# Patient Record
Sex: Female | Born: 1940 | Race: White | Hispanic: No | Marital: Married | Smoking: Never smoker
Health system: Southern US, Community
[De-identification: ages and names within clinical notes are randomized; demographics above are authoritative.]

## PROBLEM LIST (undated history)

## (undated) DIAGNOSIS — N184 Chronic kidney disease, stage 4 (severe): Secondary | ICD-10-CM

## (undated) DIAGNOSIS — D631 Anemia in chronic kidney disease: Secondary | ICD-10-CM

## (undated) DIAGNOSIS — E119 Type 2 diabetes mellitus without complications: Secondary | ICD-10-CM

## (undated) DIAGNOSIS — D4622 Refractory anemia with excess of blasts 2: Secondary | ICD-10-CM

## (undated) DIAGNOSIS — I10 Essential (primary) hypertension: Secondary | ICD-10-CM

## (undated) DIAGNOSIS — E785 Hyperlipidemia, unspecified: Secondary | ICD-10-CM

## (undated) DIAGNOSIS — Z95828 Presence of other vascular implants and grafts: Secondary | ICD-10-CM

## (undated) DIAGNOSIS — D509 Iron deficiency anemia, unspecified: Secondary | ICD-10-CM

## (undated) DIAGNOSIS — C8290 Follicular lymphoma, unspecified, unspecified site: Secondary | ICD-10-CM

## (undated) HISTORY — DX: Anemia in chronic kidney disease: D63.1

## (undated) HISTORY — DX: Type 2 diabetes mellitus without complications: E11.9

## (undated) HISTORY — DX: Presence of other vascular implants and grafts: Z95.828

## (undated) HISTORY — DX: Iron deficiency anemia, unspecified: D50.9

## (undated) HISTORY — PX: APPENDECTOMY: SHX54

## (undated) HISTORY — DX: Follicular lymphoma, unspecified, unspecified site: C82.90

## (undated) HISTORY — PX: CATARACT EXTRACTION: SUR2

## (undated) HISTORY — DX: Refractory anemia with excess of blasts 2: D46.22

## (undated) HISTORY — DX: Chronic kidney disease, stage 4 (severe): N18.4

---

## 1999-07-09 ENCOUNTER — Other Ambulatory Visit: Admission: RE | Admit: 1999-07-09 | Discharge: 1999-07-09 | Payer: Self-pay | Admitting: *Deleted

## 2004-04-23 ENCOUNTER — Emergency Department (HOSPITAL_COMMUNITY): Admission: EM | Admit: 2004-04-23 | Discharge: 2004-04-23 | Payer: Self-pay | Admitting: Emergency Medicine

## 2005-11-04 ENCOUNTER — Encounter: Admission: RE | Admit: 2005-11-04 | Discharge: 2005-11-04 | Payer: Self-pay | Admitting: Family Medicine

## 2005-11-11 ENCOUNTER — Encounter: Admission: RE | Admit: 2005-11-11 | Discharge: 2005-11-11 | Payer: Self-pay | Admitting: Family Medicine

## 2006-04-19 ENCOUNTER — Ambulatory Visit: Payer: Self-pay | Admitting: Gastroenterology

## 2006-05-02 ENCOUNTER — Other Ambulatory Visit: Admission: RE | Admit: 2006-05-02 | Discharge: 2006-05-02 | Payer: Self-pay | Admitting: Family Medicine

## 2006-05-18 ENCOUNTER — Ambulatory Visit: Payer: Self-pay | Admitting: Gastroenterology

## 2008-01-25 ENCOUNTER — Ambulatory Visit (HOSPITAL_COMMUNITY): Payer: Self-pay | Admitting: Oncology

## 2008-01-25 ENCOUNTER — Encounter (HOSPITAL_COMMUNITY): Admission: RE | Admit: 2008-01-25 | Discharge: 2008-02-24 | Payer: Self-pay | Admitting: Oncology

## 2008-04-26 ENCOUNTER — Encounter (HOSPITAL_COMMUNITY): Admission: RE | Admit: 2008-04-26 | Discharge: 2008-05-26 | Payer: Self-pay | Admitting: Oncology

## 2008-04-26 ENCOUNTER — Ambulatory Visit (HOSPITAL_COMMUNITY): Payer: Self-pay | Admitting: Oncology

## 2008-08-01 ENCOUNTER — Encounter (HOSPITAL_COMMUNITY): Admission: RE | Admit: 2008-08-01 | Discharge: 2008-08-31 | Payer: Self-pay | Admitting: Oncology

## 2008-08-01 ENCOUNTER — Ambulatory Visit (HOSPITAL_COMMUNITY): Payer: Self-pay | Admitting: Oncology

## 2009-11-27 ENCOUNTER — Ambulatory Visit (HOSPITAL_COMMUNITY): Admission: RE | Admit: 2009-11-27 | Discharge: 2009-11-27 | Payer: Self-pay | Admitting: Ophthalmology

## 2010-01-15 ENCOUNTER — Ambulatory Visit (HOSPITAL_COMMUNITY): Admission: RE | Admit: 2010-01-15 | Discharge: 2010-01-15 | Payer: Self-pay | Admitting: Ophthalmology

## 2010-09-27 LAB — GLUCOSE, CAPILLARY
Glucose-Capillary: 29 mg/dL — CL (ref 70–99)
Glucose-Capillary: 58 mg/dL — ABNORMAL LOW (ref 70–99)
Glucose-Capillary: 98 mg/dL (ref 70–99)

## 2010-09-28 LAB — GLUCOSE, CAPILLARY: Glucose-Capillary: 91 mg/dL (ref 70–99)

## 2010-09-29 LAB — BASIC METABOLIC PANEL WITH GFR
BUN: 21 mg/dL (ref 6–23)
CO2: 29 meq/L (ref 19–32)
Calcium: 9.7 mg/dL (ref 8.4–10.5)
Chloride: 104 meq/L (ref 96–112)
Creatinine, Ser: 1.15 mg/dL (ref 0.4–1.2)
GFR calc non Af Amer: 47 mL/min — ABNORMAL LOW
Glucose, Bld: 146 mg/dL — ABNORMAL HIGH (ref 70–99)
Potassium: 4.5 meq/L (ref 3.5–5.1)
Sodium: 139 meq/L (ref 135–145)

## 2010-09-29 LAB — CBC
HCT: 32.8 % — ABNORMAL LOW (ref 36.0–46.0)
Hemoglobin: 11.1 g/dL — ABNORMAL LOW (ref 12.0–15.0)
MCHC: 33.9 g/dL (ref 30.0–36.0)
MCV: 77.9 fL — ABNORMAL LOW (ref 78.0–100.0)
Platelets: 309 K/uL (ref 150–400)
RBC: 4.21 MIL/uL (ref 3.87–5.11)
RDW: 18.5 % — ABNORMAL HIGH (ref 11.5–15.5)
WBC: 9.3 K/uL (ref 4.0–10.5)

## 2010-10-26 LAB — CBC
HCT: 36.5 % (ref 36.0–46.0)
Hemoglobin: 11.5 g/dL — ABNORMAL LOW (ref 12.0–15.0)
MCHC: 31.6 g/dL (ref 30.0–36.0)
MCV: 80.7 fL (ref 78.0–100.0)
Platelets: 337 10*3/uL (ref 150–400)
RBC: 4.53 MIL/uL (ref 3.87–5.11)
RDW: 17.7 % — ABNORMAL HIGH (ref 11.5–15.5)
WBC: 9.5 10*3/uL (ref 4.0–10.5)

## 2010-10-26 LAB — DIFFERENTIAL
Basophils Absolute: 0.1 10*3/uL (ref 0.0–0.1)
Basophils Relative: 1 % (ref 0–1)
Eosinophils Absolute: 0.2 10*3/uL (ref 0.0–0.7)
Eosinophils Relative: 2 % (ref 0–5)
Lymphocytes Relative: 17 % (ref 12–46)
Lymphs Abs: 1.6 10*3/uL (ref 0.7–4.0)
Monocytes Absolute: 0.5 10*3/uL (ref 0.1–1.0)
Monocytes Relative: 5 % (ref 3–12)
Neutro Abs: 7.1 10*3/uL (ref 1.7–7.7)
Neutrophils Relative %: 75 % (ref 43–77)

## 2010-10-26 LAB — COMPREHENSIVE METABOLIC PANEL
ALT: 19 U/L (ref 0–35)
AST: 21 U/L (ref 0–37)
Albumin: 3.5 g/dL (ref 3.5–5.2)
Alkaline Phosphatase: 75 U/L (ref 39–117)
BUN: 20 mg/dL (ref 6–23)
CO2: 26 mEq/L (ref 19–32)
Calcium: 9.2 mg/dL (ref 8.4–10.5)
Chloride: 101 mEq/L (ref 96–112)
Creatinine, Ser: 1.28 mg/dL — ABNORMAL HIGH (ref 0.4–1.2)
GFR calc Af Amer: 50 mL/min — ABNORMAL LOW (ref >60)
GFR calc non Af Amer: 42 mL/min — ABNORMAL LOW (ref >60)
Glucose, Bld: 223 mg/dL — ABNORMAL HIGH (ref 70–99)
Potassium: 4.2 mEq/L (ref 3.5–5.1)
Sodium: 137 mEq/L (ref 135–145)
Total Bilirubin: 0.4 mg/dL (ref 0.3–1.2)
Total Protein: 6.6 g/dL (ref 6.0–8.3)

## 2010-10-26 LAB — PROTEIN ELECTROPH W RFLX QUANT IMMUNOGLOBULINS
Albumin ELP: 53.9 % — ABNORMAL LOW (ref 55.8–66.1)
Alpha-1-Globulin: 5.6 % — ABNORMAL HIGH (ref 2.9–4.9)
Alpha-2-Globulin: 16 % — ABNORMAL HIGH (ref 7.1–11.8)
Beta 2: 5 % (ref 3.2–6.5)
Beta Globulin: 7.2 % (ref 4.7–7.2)
Gamma Globulin: 12.3 % (ref 11.1–18.8)
M-Spike, %: NOT DETECTED g/dL
Total Protein ELP: 7 g/dL (ref 6.0–8.3)

## 2010-11-27 NOTE — Assessment & Plan Note (Signed)
[facility]                           GASTROENTEROLOGY OFFICE NOTE   NAME:[name] [name], [name] [name]                       MRN:          [id number]  DATE:04/19/2006                            DOB:          1940/12/02    REFERRING PHYSICIAN:  MARY [name]   REASON FOR REFERRAL:  Dr. Daphine Deutscher asked me to evaluate [name] [name] in  consultation regarding colorectal cancer screening and solid food dysphagia.   HISTORY OF PRESENT ILLNESS:  [name] [name] is a pleasant 70 year old woman who  has not had colorectal cancer screening.  She has no constipation or  diarrhea that lasts for more than a very brief episode.  She has had no  bleeding.  Colon cancer does not run in her family.  She did have some type  of an emergency surgery that she believes was done on her colon.  This was  25 years ago, and she is very vague on the details.  She believes that her  appendix was removed at the same time, and she thinks a polyp or some of her  intestine was removed.   She also complains of intermittent, pill-induced dysphagia that has been  bothering her for the past 2 to 3 months.  She is pretty clear that food  does not cause her any troubles, but certain pills seem to feel like they  get lodged in her mid esophagus.  She generally drinks these down with extra  water, and the feeling resolves.  She has no pyrosis.  No acid  regurgitation.   REVIEW OF SYSTEMS:  Notable for stable weight, and is otherwise essentially  normal and is available on her nursing intake sheet.   PAST MEDICAL HISTORY:  1. Obesity.  2. Diabetes.  3. Allergy.  4. Status post appendectomy.  5. Status post partial thyroidectomy.  6. Status post emergency laparotomy with an appendectomy.   CURRENT MEDICATIONS:  Diovan, hydrochlorothiazide, glimepiride, Actos,  Lipitor, metformin.   ALLERGIES:  No known drug allergies.   SOCIAL HISTORY:  Married with 3 children, nonsmoker, nondrinker.   FAMILY HISTORY:   Mother with diabetes, aunt with breast cancer, mother with  heart disease.  No colon cancer or colon polyps in the family.   PHYSICAL EXAMINATION:  Height 5 feet 5 inches, 248 pounds, blood pressure  132/78, pulse 80.  CONSTITUTIONAL:  Obese, otherwise well-appearing.  NEUROLOGIC:  Alert and oriented x3.  EYES:  Extraocular movements intact.  MOUTH:  Oropharynx moist, no lesions.  NECK:  Supple, no lymphadenopathy.  CARDIOVASCULAR:  Heart regular rate and rhythm.  LUNGS:  Clear to auscultation bilaterally.  ABDOMEN:  Soft, nontender, nondistended, normal bowel sounds.  EXTREMITIES:  No lower extremity edema.  SKIN:  No rashes or lesions on visible extremities.   ASSESSMENT AND PLAN:  [age]-year-old woman at routine risk for  colorectal cancer, history of pill-induced dysphagia.   First, she is due for colorectal cancer screening, and I will arrange for  that to be done with full screening colonoscopy at her soonest convenience.  She does also have a dysphagia to certain pills, but it does  not appear that  she has any food or liquid dysphagia.  We will arrange for her to have  esophagogastroduodenoscopy at the same time as her colonoscopy to further  evaluate, perhaps she has a Schatzki's ring or a peptic stricture.  I see no  reason for any further blood tests or imaging studies prior to then.            ______________________________  Rachael Fee, MD      DPJ/MedQ  DD:  04/19/2006  DT:  04/21/2006  Job #:  [id number]   cc:   Paulene Floor, [place], [facility]

## 2011-04-09 LAB — CBC
HCT: 33.1 — ABNORMAL LOW
Hemoglobin: 10.7 — ABNORMAL LOW
MCHC: 32.4
MCV: 81.8
Platelets: 305
RBC: 4.05
RDW: 17.2 — ABNORMAL HIGH
WBC: 11.8 — ABNORMAL HIGH

## 2011-04-09 LAB — PROTEIN ELECTROPH W RFLX QUANT IMMUNOGLOBULINS
Albumin ELP: 51.9 — ABNORMAL LOW
Alpha-1-Globulin: 6.2 — ABNORMAL HIGH
Alpha-2-Globulin: 17.3 — ABNORMAL HIGH
Beta 2: 5.1
Beta Globulin: 8 — ABNORMAL HIGH
Gamma Globulin: 11.5
M-Spike, %: NOT DETECTED
Total Protein ELP: 7.1

## 2011-04-09 LAB — COMPREHENSIVE METABOLIC PANEL
ALT: 16
AST: 21
Albumin: 3.6
Alkaline Phosphatase: 80
BUN: 15
CO2: 28
Calcium: 9.6
Chloride: 104
Creatinine, Ser: 1.14
GFR calc Af Amer: 58 — ABNORMAL LOW
GFR calc non Af Amer: 48 — ABNORMAL LOW
Glucose, Bld: 70
Potassium: 4.1
Sodium: 140
Total Bilirubin: 0.4
Total Protein: 6.3

## 2011-04-09 LAB — IMMUNOFIXATION ELECTROPHORESIS
IgA: 108
IgG (Immunoglobin G), Serum: 737
IgM, Serum: 253
Total Protein ELP: 7.1

## 2011-04-09 LAB — DIFFERENTIAL
Basophils Absolute: 0.1
Basophils Relative: 1
Eosinophils Absolute: 0.2
Eosinophils Relative: 2
Lymphocytes Relative: 21
Lymphs Abs: 2.5
Monocytes Absolute: 0.8
Monocytes Relative: 7
Neutro Abs: 8.3 — ABNORMAL HIGH
Neutrophils Relative %: 70

## 2011-04-09 LAB — IMMUNOFIXATION ADD-ON

## 2011-04-09 LAB — IGG, IGA, IGM
IgA: 106
IgG (Immunoglobin G), Serum: 725
IgM, Serum: 255

## 2011-04-13 LAB — COMPREHENSIVE METABOLIC PANEL
ALT: 17
AST: 21
Albumin: 3.6
Alkaline Phosphatase: 82
BUN: 22
CO2: 26
Calcium: 9.4
Chloride: 103
Creatinine, Ser: 1.12
GFR calc Af Amer: 59 — ABNORMAL LOW
GFR calc non Af Amer: 49 — ABNORMAL LOW
Glucose, Bld: 179 — ABNORMAL HIGH
Potassium: 4.3
Sodium: 139
Total Bilirubin: 0.4
Total Protein: 6.4

## 2011-04-13 LAB — PROTEIN ELECTROPH W RFLX QUANT IMMUNOGLOBULINS
Albumin ELP: 52.6 — ABNORMAL LOW
Alpha-1-Globulin: 6 — ABNORMAL HIGH
Alpha-2-Globulin: 17.9 — ABNORMAL HIGH
Beta 2: 4.4
Beta Globulin: 7.9 — ABNORMAL HIGH

## 2011-04-13 LAB — DIFFERENTIAL
Basophils Absolute: 0.1
Basophils Relative: 1
Eosinophils Absolute: 0.2
Eosinophils Relative: 3
Lymphocytes Relative: 25
Lymphs Abs: 2.1
Monocytes Absolute: 0.5
Monocytes Relative: 6
Neutro Abs: 5.6
Neutrophils Relative %: 66

## 2011-04-13 LAB — CBC
HCT: 34.1 — ABNORMAL LOW
Hemoglobin: 11 — ABNORMAL LOW
MCHC: 32.3
MCV: 82.1
Platelets: 304
RBC: 4.15
RDW: 17.9 — ABNORMAL HIGH
WBC: 8.6

## 2011-04-13 LAB — IMMUNOFIXATION ELECTROPHORESIS
IgM, Serum: 187
Total Protein ELP: 6.7

## 2011-04-13 LAB — IMMUNOFIXATION ADD-ON

## 2012-09-19 ENCOUNTER — Other Ambulatory Visit: Payer: Self-pay | Admitting: Urology

## 2012-09-19 ENCOUNTER — Ambulatory Visit (INDEPENDENT_AMBULATORY_CARE_PROVIDER_SITE_OTHER): Payer: Medicare Other | Admitting: Urology

## 2012-09-19 DIAGNOSIS — N1330 Unspecified hydronephrosis: Secondary | ICD-10-CM

## 2012-09-19 DIAGNOSIS — N8110 Cystocele, unspecified: Secondary | ICD-10-CM

## 2012-09-19 DIAGNOSIS — N814 Uterovaginal prolapse, unspecified: Secondary | ICD-10-CM

## 2012-09-19 DIAGNOSIS — N189 Chronic kidney disease, unspecified: Secondary | ICD-10-CM

## 2012-09-22 ENCOUNTER — Ambulatory Visit (HOSPITAL_COMMUNITY)
Admission: RE | Admit: 2012-09-22 | Discharge: 2012-09-22 | Disposition: A | Discharge status: Home / Self Care | Payer: Medicare Other | Source: Ambulatory Visit | Attending: Urology | Admitting: Urology

## 2012-09-22 DIAGNOSIS — R935 Abnormal findings on diagnostic imaging of other abdominal regions, including retroperitoneum: Secondary | ICD-10-CM | POA: Insufficient documentation

## 2012-09-22 DIAGNOSIS — K8020 Calculus of gallbladder without cholecystitis without obstruction: Secondary | ICD-10-CM | POA: Insufficient documentation

## 2012-09-22 DIAGNOSIS — N1330 Unspecified hydronephrosis: Secondary | ICD-10-CM | POA: Insufficient documentation

## 2012-09-22 DIAGNOSIS — R1031 Right lower quadrant pain: Secondary | ICD-10-CM | POA: Insufficient documentation

## 2012-09-26 ENCOUNTER — Other Ambulatory Visit: Payer: Self-pay | Admitting: Urology

## 2012-09-26 DIAGNOSIS — N1330 Unspecified hydronephrosis: Secondary | ICD-10-CM

## 2012-09-29 ENCOUNTER — Ambulatory Visit (HOSPITAL_COMMUNITY)
Admission: RE | Admit: 2012-09-29 | Discharge: 2012-09-29 | Disposition: A | Discharge status: Home / Self Care | Payer: Medicare Other | Source: Ambulatory Visit | Attending: Urology | Admitting: Urology

## 2012-09-29 ENCOUNTER — Encounter (HOSPITAL_COMMUNITY): Payer: Self-pay

## 2012-09-29 DIAGNOSIS — R935 Abnormal findings on diagnostic imaging of other abdominal regions, including retroperitoneum: Secondary | ICD-10-CM | POA: Insufficient documentation

## 2012-09-29 DIAGNOSIS — N1330 Unspecified hydronephrosis: Secondary | ICD-10-CM | POA: Insufficient documentation

## 2012-09-29 DIAGNOSIS — K5730 Diverticulosis of large intestine without perforation or abscess without bleeding: Secondary | ICD-10-CM | POA: Insufficient documentation

## 2012-10-04 ENCOUNTER — Other Ambulatory Visit: Payer: Self-pay | Admitting: Urology

## 2012-10-04 DIAGNOSIS — R1900 Intra-abdominal and pelvic swelling, mass and lump, unspecified site: Secondary | ICD-10-CM

## 2012-10-05 ENCOUNTER — Other Ambulatory Visit: Payer: Self-pay | Admitting: Radiology

## 2012-10-06 ENCOUNTER — Encounter (HOSPITAL_COMMUNITY): Payer: Self-pay | Admitting: Pharmacy Technician

## 2012-10-06 ENCOUNTER — Other Ambulatory Visit: Payer: Self-pay | Admitting: Radiology

## 2012-10-10 ENCOUNTER — Other Ambulatory Visit: Payer: Self-pay | Admitting: Radiology

## 2012-10-11 ENCOUNTER — Ambulatory Visit (HOSPITAL_COMMUNITY)
Admission: RE | Admit: 2012-10-11 | Discharge: 2012-10-11 | Disposition: A | Discharge status: Home / Self Care | Payer: Medicare Other | Source: Ambulatory Visit | Attending: Urology | Admitting: Urology

## 2012-10-11 ENCOUNTER — Encounter (HOSPITAL_COMMUNITY): Payer: Self-pay

## 2012-10-11 ENCOUNTER — Other Ambulatory Visit (HOSPITAL_COMMUNITY): Payer: Medicare Other

## 2012-10-11 VITALS — BP 152/75 | HR 71 | Temp 98.0°F | Resp 20

## 2012-10-11 DIAGNOSIS — E785 Hyperlipidemia, unspecified: Secondary | ICD-10-CM | POA: Insufficient documentation

## 2012-10-11 DIAGNOSIS — E119 Type 2 diabetes mellitus without complications: Secondary | ICD-10-CM | POA: Insufficient documentation

## 2012-10-11 DIAGNOSIS — N135 Crossing vessel and stricture of ureter without hydronephrosis: Secondary | ICD-10-CM | POA: Insufficient documentation

## 2012-10-11 DIAGNOSIS — R1900 Intra-abdominal and pelvic swelling, mass and lump, unspecified site: Secondary | ICD-10-CM

## 2012-10-11 DIAGNOSIS — C8589 Other specified types of non-Hodgkin lymphoma, extranodal and solid organ sites: Secondary | ICD-10-CM | POA: Insufficient documentation

## 2012-10-11 DIAGNOSIS — R1909 Other intra-abdominal and pelvic swelling, mass and lump: Secondary | ICD-10-CM | POA: Insufficient documentation

## 2012-10-11 DIAGNOSIS — I10 Essential (primary) hypertension: Secondary | ICD-10-CM | POA: Insufficient documentation

## 2012-10-11 DIAGNOSIS — Z79899 Other long term (current) drug therapy: Secondary | ICD-10-CM | POA: Insufficient documentation

## 2012-10-11 HISTORY — DX: Hyperlipidemia, unspecified: E78.5

## 2012-10-11 HISTORY — DX: Essential (primary) hypertension: I10

## 2012-10-11 LAB — CBC
HCT: 31.8 % — ABNORMAL LOW (ref 36.0–46.0)
Hemoglobin: 9.7 g/dL — ABNORMAL LOW (ref 12.0–15.0)
MCHC: 30.5 g/dL (ref 30.0–36.0)
MCV: 78.7 fL (ref 78.0–100.0)
RDW: 16.7 % — ABNORMAL HIGH (ref 11.5–15.5)

## 2012-10-11 LAB — APTT: aPTT: 30 seconds (ref 24–37)

## 2012-10-11 LAB — PROTIME-INR: INR: 1.03 (ref 0.00–1.49)

## 2012-10-11 MED ORDER — MIDAZOLAM HCL 2 MG/2ML IJ SOLN
INTRAMUSCULAR | Status: AC | PRN
  Administered 2012-10-11: 2 mg via INTRAVENOUS

## 2012-10-11 MED ORDER — FENTANYL CITRATE 0.05 MG/ML IJ SOLN
INTRAMUSCULAR | Status: AC
  Filled 2012-10-11: qty 4

## 2012-10-11 MED ORDER — MIDAZOLAM HCL 2 MG/2ML IJ SOLN
INTRAMUSCULAR | Status: AC
  Filled 2012-10-11: qty 4

## 2012-10-11 MED ORDER — HYDROCODONE-ACETAMINOPHEN 5-325 MG PO TABS: 1.0000 | ORAL_TABLET | ORAL | Status: DC | PRN

## 2012-10-11 MED ORDER — SODIUM CHLORIDE 0.9 % IV SOLN
INTRAVENOUS | Status: DC
  Administered 2012-10-11: 11:00:00 via INTRAVENOUS

## 2012-10-11 MED ORDER — FENTANYL CITRATE 0.05 MG/ML IJ SOLN
INTRAMUSCULAR | Status: AC | PRN
  Administered 2012-10-11: 100 ug via INTRAVENOUS

## 2012-10-11 NOTE — H&P (Signed)
Chief Complaint: "I'm here for a biopsy" Referring Physician:Wrenn HPI: [name] [name] is an 72 y.o. female with newly found rght pelvic retroperitoneal mass. She is scheduled for CT guided biopsy. PMHx and meds reviewed.  Past Medical History:  Past Medical History  Diagnosis Date  . Hypertension   . Diabetes   . Hyperlipidemia     Past Surgical History: History reviewed. No pertinent past surgical history.  Family History: History reviewed. No pertinent family history.  Social History:  reports that she has never smoked. She does not have any smokeless tobacco history on file. She reports that she does not drink alcohol or use illicit drugs.  Allergies: No Known Allergies  Medications: acetaminophen (TYLENOL ARTHRITIS PAIN) 650 MG CR tablet (Taking) Sig - Route: Take 650 mg by mouth every 8 (eight) hours as needed for pain. - Oral Class: Historical Med atorvastatin (LIPITOR) 80 MG tablet (Taking) Sig - Route: Take 80 mg by mouth every morning. - Oral Class: Historical Med Number of times this order has been changed since signing: 1 [address] docusate sodium (COLACE) 100 MG capsule (Taking) Sig - Route: Take 100-200 mg by mouth daily. - Oral Class: Historical Med furosemide (LASIX) 20 MG tablet (Taking) Sig - Route: Take 20 mg by mouth daily. - Oral Class: Historical Med Number of times this order has been changed since signing: 1 [address] glipiZIDE (GLUCOTROL) 10 MG tablet (Taking) Sig - Route: Take 10 mg by mouth 2 (two) times daily before a meal. - Oral Class: Historical Med Number of times this order has been changed since signing: 1 [address] insulin detemir (LEVEMIR FLEXPEN) 100 UNIT/ML injection (Taking) Sig - Route: Inject 50-60 Units into the skin every morning. - Subcutaneous Class: Historical Med Number of times this order has been changed since signing: 1 Order [address] losartan (COZAAR) 100 MG tablet (Taking) Sig - Route: Take 100 mg by mouth every  morning. - Oral Class: Historical Med Number of times this order has been changed since signing: 1 Order [address] Multiple Vitamin (MULTIVITAMIN WITH MINERALS) TABS (Taking) Sig - Route: Take 1 tablet by mouth daily.   Please HPI for pertinent positives, otherwise complete 10 system ROS negative.  Physical Exam: Temp: 98, HR: 71, RR: 18, BP 189/75, O2: 99%   General Appearance:  Alert, cooperative, no distress, appears stated age  Head:  Normocephalic, without obvious abnormality, atraumatic  ENT: Unremarkable  Neck: Supple, symmetrical, trachea midline.  Lungs:   Clear to auscultation bilaterally, no w/r/r, respirations unlabored without use of accessory muscles.  Heart:  Regular rate and rhythm, S1, S2 normal, no murmur, rub or gallop.   Neurologic: Normal affect, no gross deficits.   Results for orders placed during the hospital encounter of 10/11/12 (from the past 48 hour(s))  APTT     Status: None   Collection Time    10/11/12 10:45 AM      Result Value Range   aPTT 30  24 - 37 seconds  CBC     Status: Abnormal   Collection Time    10/11/12 10:45 AM      Result Value Range   WBC 8.6  4.0 - 10.5 K/uL   RBC 4.04  3.87 - 5.11 MIL/uL   Hemoglobin 9.7 (*) 12.0 - 15.0 g/dL   HCT 16.1 (*) 09.6 - 04.5 %   MCV 78.7  78.0 - 100.0 fL   MCH 24.0 (*) 26.0 - 34.0 pg   MCHC  30.5  30.0 - 36.0 g/dL   RDW 11.9 (*) 14.7 - 82.9 %   Platelets 317  150 - 400 K/uL  PROTIME-INR     Status: None   Collection Time    10/11/12 10:45 AM      Result Value Range   Prothrombin Time 13.4  11.6 - 15.2 seconds   INR 1.03  0.00 - 1.49   No results found.  Assessment/Plan Retroperitoneal mass For CT guided biopsy today Discussed procedure, risks, complications, use of sedation. Pt ate some oatmeal at 0700. Labs reviewed, ok. Consent signed in chart  Brayton El PA-C 10/11/2012, 11:23 AM

## 2012-10-11 NOTE — Procedures (Signed)
CT core biopsy No complication No blood loss. See complete dictation in [facility].

## 2012-10-31 ENCOUNTER — Encounter (HOSPITAL_COMMUNITY): Payer: Medicare Other | Attending: Oncology | Admitting: Oncology

## 2012-10-31 VITALS — BP 154/56 | HR 69 | Temp 97.8°F | Resp 20 | Wt 235.6 lb

## 2012-10-31 DIAGNOSIS — C8589 Other specified types of non-Hodgkin lymphoma, extranodal and solid organ sites: Secondary | ICD-10-CM

## 2012-10-31 DIAGNOSIS — C8299 Follicular lymphoma, unspecified, extranodal and solid organ sites: Secondary | ICD-10-CM | POA: Insufficient documentation

## 2012-10-31 DIAGNOSIS — E119 Type 2 diabetes mellitus without complications: Secondary | ICD-10-CM | POA: Insufficient documentation

## 2012-10-31 DIAGNOSIS — C8590 Non-Hodgkin lymphoma, unspecified, unspecified site: Secondary | ICD-10-CM

## 2012-10-31 LAB — COMPREHENSIVE METABOLIC PANEL
ALT: 12 U/L (ref 0–35)
AST: 16 U/L (ref 0–37)
Albumin: 3.9 g/dL (ref 3.5–5.2)
Alkaline Phosphatase: 132 U/L — ABNORMAL HIGH (ref 39–117)
BUN: 48 mg/dL — ABNORMAL HIGH (ref 6–23)
Potassium: 4.1 mEq/L (ref 3.5–5.1)
Sodium: 136 mEq/L (ref 135–145)
Total Protein: 7.6 g/dL (ref 6.0–8.3)

## 2012-10-31 LAB — LACTATE DEHYDROGENASE: LDH: 212 U/L (ref 94–250)

## 2012-10-31 LAB — CBC WITH DIFFERENTIAL/PLATELET
Basophils Absolute: 0 10*3/uL (ref 0.0–0.1)
Basophils Relative: 0 % (ref 0–1)
Eosinophils Absolute: 0.2 10*3/uL (ref 0.0–0.7)
MCH: 25.1 pg — ABNORMAL LOW (ref 26.0–34.0)
MCHC: 32.1 g/dL (ref 30.0–36.0)
Neutro Abs: 7.7 10*3/uL (ref 1.7–7.7)
Neutrophils Relative %: 73 % (ref 43–77)
Platelets: 343 10*3/uL (ref 150–400)
RDW: 17 % — ABNORMAL HIGH (ref 11.5–15.5)

## 2012-10-31 LAB — IRON AND TIBC
Saturation Ratios: 6 % — ABNORMAL LOW (ref 20–55)
TIBC: 390 ug/dL (ref 250–470)
UIBC: 368 ug/dL (ref 125–400)

## 2012-10-31 NOTE — Patient Instructions (Signed)
[facility] Discharge Instructions  RECOMMENDATIONS MADE BY THE CONSULTANT AND ANY TEST RESULTS WILL BE SENT TO YOUR REFERRING PHYSICIAN.  EXAM FINDINGS BY THE PHYSICIAN TODAY AND SIGNS OR SYMPTOMS TO REPORT TO CLINIC OR PRIMARY PHYSICIAN:   INSTRUCTIONS GIVEN AND DISCUSSED: Please be sure to keep your sugar and diet under strict control.  This is very important for the success of your therapy.   SPECIAL INSTRUCTIONS/FOLLOW-UP: You will see Dr. Malvin Johns about port placement tomorrow and will have your PET scan Monday.  Please see [place] as scheduled.    Thank you for choosing [facility] to provide your oncology and hematology care.  To afford each patient quality time with our providers, please arrive at least 15 minutes before your scheduled appointment time.  With your help, our goal is to use those 15 minutes to complete the necessary work-up to ensure our physicians have the information they need to help with your evaluation and healthcare recommendations.    Effective January 1st, 2014, we ask that you re-schedule your appointment with our physicians should you arrive 10 or more minutes late for your appointment.  We strive to give you quality time with our providers, and arriving late affects you and other patients whose appointments are after yours.    Again, thank you for choosing [facility].  Our hope is that these requests will decrease the amount of time that you wait before being seen by our physicians.       _____________________________________________________________  Should you have questions after your visit to [facility], please contact our office at [phone] between the hours of 8:30 a.m. and 5:00 p.m.  Voicemails left after 4:30 p.m. will not be returned until the following business day.  For prescription refill requests, have your pharmacy contact our office with your prescription refill request.

## 2012-10-31 NOTE — Progress Notes (Signed)
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 I believe is also causing right back pain, right flank pain and lymphedema in both legs. #2 diabetes mellitus not in the best control. She does not follow a diabetic diet well whatsoever. #3 obesity  #4 degenerative joint disease. #5 history of anemia #6 venous insufficiency changes of her lower extremities  This lady saw me in the past although her old chart is not available. She was here last in 2009. She cannot remember why she was here. She however started having some time in the last 6-12 months discomfort in her back followed by more swelling in her legs and she saw her physician and found that her 1 kidney was abnormal. She had hydroureteronephrosis. She was referred to Dr. Kristian Covey and then to Dr Annabell Howells. Eventually she had a CT-guided biopsy on 10/11/2012 which revealed the above-mentioned non-Hodgkin's lymphoma.  She is here today with her husband.  She has no B. symptomatology.  She has 3 children all signs 1 lives in [place] 1 lives next her and 1 lives on the other side of town.  They are in good health to the best her knowledge. She and her husband have been married 49 years soon.  She's been a diabetic for quite some time. She also has a history of hypercholesterolemia. She also has a history of hypertension.  Oncology review of systems otherwise is noncontributory. She herself is not aware of any new lungs or bumps anywhere else.  BP 154/56  Pulse 69  Temp(Src) 97.8 F (36.6 C) (Oral)  Resp 20  Wt 235 lb 9.6 oz (106.867 kg)  BMI 39.21 kg/m2  She is in no acute distress. Skin is warm and dry to touch. On her right cheek she has an 15 a 17 mm irregular area that's hypopigmented but slightly red at the base unclear etiology. I have recommended she see her dermatologist. She has no palpable lymph nodes in the cervical, supraclavicular,  infraclavicular, axillary, epitrochlear, or inguinal areas. She has a very obese abdomen without palpable masses or palpable hepatosplenomegaly. Breast exam is negative for masses. Heart shows a regular rhythm and rate without murmur rub or gallop. Lungs are clear to auscultation and percussion. She has 2+ leg edema with chronic venous insufficiency changes with thickening of the skin and pigmentation medial lower third of the lower legs. Dorsalis pedis pulses are 1+ and symmetrical. I could not appreciate posterior tibialis pulses. She is edentulous. Throat is clear.   She is alert and oriented.  This lady has symptomatically positive non-Hodgkin's lymphoma which is at least stage II based upon the number of nodes involved on the CAT scan. We should stage her with a PET scan, try to see if she needs a stent in that right ureter, proceed with port placement and then bendamustine and Rituxan chemotherapy based upon her findings. She is agreeable. She will however need very good adherence to a diabetic regimen during chemotherapy and she might as well start today. She agrees to do that. We will try to get therapy started within the next 2-3 weeks.

## 2012-11-01 ENCOUNTER — Other Ambulatory Visit: Payer: Self-pay

## 2012-11-01 ENCOUNTER — Other Ambulatory Visit: Payer: Self-pay | Admitting: Urology

## 2012-11-01 ENCOUNTER — Encounter (HOSPITAL_COMMUNITY): Payer: Self-pay

## 2012-11-01 ENCOUNTER — Encounter (HOSPITAL_COMMUNITY)
Admission: RE | Admit: 2012-11-01 | Discharge: 2012-11-01 | Disposition: A | Discharge status: Home / Self Care | Payer: Medicare Other | Source: Ambulatory Visit | Attending: General Surgery | Admitting: General Surgery

## 2012-11-01 LAB — SURGICAL PCR SCREEN
MRSA, PCR: NEGATIVE
Staphylococcus aureus: NEGATIVE

## 2012-11-01 LAB — FERRITIN: Ferritin: 28 ng/mL (ref 10–291)

## 2012-11-01 NOTE — Patient Instructions (Addendum)
[place]  11/01/2012   Your procedure is scheduled on:  11/03/2012  Report to [facility] at 9:15 AM.  Call this number if you have problems the morning of surgery: [phone]   Remember:   Do not eat food or drink liquids after midnight.   Take these medicines the morning of surgery with A SIP OF WATER: Losartan   Do not wear jewelry, make-up or nail polish.  Do not wear lotions, powders, or perfumes. You may wear deodorant.  Do not shave 48 hours prior to surgery. Men may shave face and neck.  Do not bring valuables to the hospital.  Contacts, dentures or bridgework may not be worn into surgery.  Leave suitcase in the car. After surgery it may be brought to your room.  For patients admitted to the hospital, checkout time is 11:00 AM the day of  discharge.   Patients discharged the day of surgery will not be allowed to drive  home.    Special Instructions: Shower using CHG 2 nights before surgery and the night before surgery.  If you shower the day of surgery use CHG.  Use special wash - you have one bottle of CHG for all showers.  You should use approximately 1/3 of the bottle for each shower.   Please read over the following fact sheets that you were given: Pain Booklet, Surgical Site Infection Prevention, Anesthesia Post-op Instructions and Care and Recovery After Surgery Implanted Port Instructions An implanted port is a central line that has a round shape and is placed under the skin. It is used for long-term IV (intravenous) access for:  Medicine.  Fluids.  Liquid nutrition, such as TPN (total parenteral nutrition).  Blood samples. Ports can be placed:  In the chest area just below the collarbone (this is the most common place.)  In the arms.  In the belly (abdomen) area.  In the legs. PARTS OF THE PORT A port has 2 main parts:  The reservoir. The reservoir is round, disc-shaped, and will be a small, raised area under your skin.  The reservoir is the part  where a needle is inserted (accessed) to either give medicines or to draw blood.  The catheter. The catheter is a long, slender tube that extends from the reservoir. The catheter is placed into a large vein.  Medicine that is inserted into the reservoir goes into the catheter and then into the vein. INSERTION OF THE PORT  The port is surgically placed in either an operating room or in a procedural area (interventional radiology).  Medicine may be given to help you relax during the procedure.  The skin where the port will be inserted is numbed (local anesthetic).  1 or 2 small cuts (incisions) will be made in the skin to insert the port.  The port can be used after it has been inserted. INCISION SITE CARE  The incision site may have small adhesive strips on it. This helps keep the incision site closed. Sometimes, no adhesive strips are placed. Instead of adhesive strips, a special kind of surgical glue is used to keep the incision closed.  If adhesive strips were placed on the incision sites, do not take them off. They will fall off on their own.  The incision site may be sore for 1 to 2 days. Pain medicine can help.  Do not get the incision site wet. Bathe or shower as directed by your caregiver.  The incision site should heal in 5 to 7  days. A small scar may form after the incision has healed. ACCESSING THE PORT Special steps must be taken to access the port:  Before the port is accessed, a numbing cream can be placed on the skin. This helps numb the skin over the port site.  A sterile technique is used to access the port.  The port is accessed with a needle. Only "non-coring" port needles should be used to access the port. Once the port is accessed, a blood return should be checked. This helps ensure the port is in the vein and is not clogged (clotted).  If your caregiver believes your port should remain accessed, a clear (transparent) bandage will be placed over the needle  site. The bandage and needle will need to be changed every week or as directed by your caregiver.  Keep the bandage covering the needle clean and dry. Do not get it wet. Follow your caregiver's instructions on how to take a shower or bath when the port is accessed.  If your port does not need to stay accessed, no bandage is needed over the port. FLUSHING THE PORT Flushing the port keeps it from getting clogged. How often the port is flushed depends on:  If a constant infusion is running. If a constant infusion is running, the port may not need to be flushed.  If intermittent medicines are given.  If the port is not being used. For intermittent medicines:  The port will need to be flushed:  After medicines have been given.  After blood has been drawn.  As part of routine maintenance.  A port is normally flushed with:  Normal saline.  Heparin.  Follow your caregiver's advice on how often, how much, and the type of flush to use on your port. IMPORTANT PORT INFORMATION  Tell your caregiver if you are allergic to heparin.  After your port is placed, you will get a manufacturer's information card. The card has information about your port. Keep this card with you at all times.  There are many types of ports available. Know what kind of port you have.  In case of an emergency, it may be helpful to wear a medical alert bracelet. This can help alert health care workers that you have a port.  The port can stay in for as long as your caregiver believes it is necessary.  When it is time for the port to come out, surgery will be done to remove it. The surgery will be similar to how the port was put in.  If you are in the hospital or clinic:  Your port will be taken care of and flushed by a nurse.  If you are at home:  A home health care nurse may give medicines and take care of the port.  You or a family member can get special training and directions for giving medicine and  taking care of the port at home. SEEK IMMEDIATE MEDICAL CARE IF:   Your port does not flush or you are unable to get a blood return.  New drainage or pus is coming from the incision.  A bad smell is coming from the incision site.  You develop swelling or increased redness at the incision site.  You develop increased swelling or pain at the port site.  You develop swelling or pain in the surrounding skin near the port.  You have an oral temperature above 102 F (38.9 C), not controlled by medicine. MAKE SURE YOU:   Understand these instructions.  Will watch your condition.  Will get help right away if you are not doing well or get worse. Document Released: 06/28/2005 Document Revised: 09/20/2011 Document Reviewed: 09/19/2008 [facility] Patient Information 2013 [place], [place].  PATIENT INSTRUCTIONS POST-ANESTHESIA  IMMEDIATELY FOLLOWING SURGERY:  Do not drive or operate machinery for the first twenty four hours after surgery.  Do not make any important decisions for twenty four hours after surgery or while taking narcotic pain medications or sedatives.  If you develop intractable nausea and vomiting or a severe headache please notify your doctor immediately.  FOLLOW-UP:  Please make an appointment with your surgeon as instructed. You do not need to follow up with anesthesia unless specifically instructed to do so.  WOUND CARE INSTRUCTIONS (if applicable):  Keep a dry clean dressing on the anesthesia/puncture wound site if there is drainage.  Once the wound has quit draining you may leave it open to air.  Generally you should leave the bandage intact for twenty four hours unless there is drainage.  If the epidural site drains for more than 36-48 hours please call the anesthesia department.  QUESTIONS?:  Please feel free to call your physician or the hospital operator if you have any questions, and they will be happy to assist you.      Ureteral Stent A ureteral stent is a soft  plastic tube with multiple holes. The stent is inserted into a ureter to help drain urine from the kidney into the bladder. The tube has a coil on each end to keep it from falling out. One end stays in the kidney. The other end stays in the bladder. A stent cannot be seen from the outside. Usually it does not keep you from going about normal routines. A ureteral stent is used to bypass a blockage in your kidney or ureter. This blockage can be caused by kidney stones, scar tissue, pregnancy, or other causes. It can also be used during treatment to remove a kidney stone or to let a ureter heal after surgery. The stent allows urine to drain from the kidney into the bladder. It is most often taken out after the blockage has been removed or the ureter has healed. If a stent is needed for a long time, it will be changed every few months. INSERTING THE STENT Your stent is put in by a urologist. This is a medical doctor trained for treating genitourinary (kidney, ureter and bladder) problems. Before your stent is put in, your caregiver may order x-rays or other imaging tests of your kidneys and ureters. The stent is inserted in a hospital or same day surgical center. You can anticipate going home the same day. PROCEDURE  A special x-ray machine called a fluoroscope is used to guide the insertion of your stent. This allows your doctor to make sure the stent is in the correct place.  First you are given anesthesia to keep you comfortable.  Then your doctor inserts a special lighted instrument called a cystoscope into your bladder. This allows your doctor to see the opening to the ureter.  A thin wire is carefully threaded into the bladder and up the ureter. The stent is inserted over the wire and the wire is then removed. HOME CARE INSTRUCTIONS   While the stent is in place, you may feel some discomfort. Certain movements may trigger pain or a feeling that you need to urinate. Your caregiver may give you pain  medication. Only take over-the-counter or prescription medicines for pain, discomfort, or fever as  directed by your caregiver. Do not take aspirin as this can make bleeding worse.  You may be given medications to prevent infection or bladder spasms. Be sure to take all medications as directed.  Drink plenty of fluids.  You may have small amounts of bleeding causing your urine to be slightly red. This is nothing to be concerned about. REMOVAL OF THE STENT Your stent is left in until the blockage is resolved. This may take two weeks or longer. Before the stent is removed, you may have an x-ray make sure the ureter is open. The stent can be removed by your caregiver in the office. Medications may be given for comfort. Be sure to keep all follow-up appointments so your caregiver can check that you are healing properly. SEEK IMMEDIATE MEDICAL CARE IF:   Your urine is dark red or has blood clots.  You are incontinent (leaking urine).  You have an oral temperature above 102 F (38.9 C), chills, nausea (feeling sick to your stomach), or vomiting.  Your pain is not relieved by pain medication. Do not take aspirin as this can make bleeding worse.  The end of the stent comes out of the urethra. Document Released: 06/25/2000 Document Revised: 09/20/2011 Document Reviewed: 06/24/2008 [facility] Patient Information 2013 [place], [place].

## 2012-11-02 ENCOUNTER — Encounter (HOSPITAL_COMMUNITY): Payer: Self-pay | Admitting: Pharmacy Technician

## 2012-11-02 NOTE — Consult Note (Signed)
[name] [name], [name] [name] NO.:  [id number]  MEDICAL RECORD NO.:  [id number]  LOCATION:  [facility]                         FACILITY:  [facility]  PHYSICIAN:  Barbaraann Barthel, M.D. DATE OF BIRTH:  [birth year]  DATE OF CONSULTATION: DATE OF DISCHARGE:                                CONSULTATION   DIAGNOSIS:  Non-Hodgkin's lymphoma.  NOTE:  This is a 72 year old white female who has just undergone a retroperitoneal biopsy of a large mass, which was found to be a non- Hodgkin's lymphoma.  This took place on October 21, 2012.  She was referred to surgery for placement of Port-A-Cath, so she could continue treatment by the Oncology team.  PAST MEDICAL HISTORY:  Includes hypertension, diabetes mellitus, hypercholesterolemia, and she has a history of diverticulosis.  She also has asymptomatic at present cholelithiasis.  SURGICAL HISTORY:  She has had bilateral cataract surgery in 2012.  She had a T and A done in childhood.  She had an appendectomy done in 1980s and she had a right thyroid lobectomy performed at [facility] several years ago, she does not recall.  SOCIAL HISTORY:  She is a nonsmoker, nondrinker.  MEDICATIONS:  For medication list, see the present medication list.  ALLERGIES:  As stated, she has no known allergies.  PHYSICAL EXAMINATION:  VITAL SIGNS:  She is 5 feet 5 inches, weighs 235 pounds, temperature is 97.3, pulse rate 60 per minute, respirations 14, blood pressure 130/64. HEENT:  Head is normocephalic.  Eyes, extraocular movements are intact. Pupils were round and react to light and accommodation.  Her conjunctiva appear pallid.  Nose, oral mucosa are moist.  She does have a dental prosthesis superiorly and inferiorly. NECK:  Supple and cylindrical without jugular vein distention, thyromegaly, or tracheal deviation.  She has a scar consistent with her previous right thyroid lobectomy.  No bruits are appreciated. CHEST:  Clear both anterior and posterior  auscultation. HEART:  Regular. BREASTS AND AXILLA:  Without masses. ABDOMEN:  She is quite obese and has a very doughy abdomen.  I do not appreciate any visceromegaly or hernias. RECTAL AND PELVIC:  Deferred. EXTREMITIES:  The patient has bilateral lower extremity edema.  REVIEW OF SYSTEMS:  NEURO SYSTEM:  No history of migraines or seizures or any lateralizing symptoms.  ENDOCRINE SYSTEM:  She has a history of diabetes for which she takes insulin and oral hypoglycemics and a past history of right thyroid lobectomy.  CARDIOPULMONARY SYSTEM:  History of hypercholesterolemia and hypertension.  MUSCULOSKELETAL SYSTEM:  She has many arthritic complaints and she is obese.  OB/GYN SYSTEM:  She is menopausal.  Last menstrual period was 25 years ago.  She is a gravida 4, para 3, cesarean 0, abortus 1 female with no family history of carcinoma of the breast.  Her last mammogram was in 2010, as she recalls.  GI SYSTEM:  No history of hepatitis.  She has occasional constipation.  No history of diarrhea or bright red rectal bleeding. She does have a history of diverticulitis and diverticulosis.  No unexplained weight loss.  She is obese.  She had a colonoscopy done 5 years ago.  GU SYSTEM:  No history of kidney stones or  frequencies.  She does have some problems with hydronephrosis and problems as mentioned by her CT scan due to her collective system being compressed by the large retroperitoneal mass.  We discussed the placement of a Port-A-Cath with this patient and her husband in detail discussing complications not limited to, but including bleeding, infection, thrombosis, pneumothorax, and the catheter embolization.  Informed consent was obtained.  We will plan to put this in as soon as we are able to allow the oncology team to proceed with her chemotherapy.     Barbaraann Barthel, M.D.     WB/MEDQ  D:  11/01/2012  T:  11/02/2012  Job:  [id number]

## 2012-11-02 NOTE — H&P (Signed)
ent Illness This 72 year old female, native of [place], is sent by Dr. Fausto Skillern for evaluation and management of right hydronephrosis. She recently had a renal ultrasound for evaluation of renal insufficiency. He had a creatinine of 2.5. She was noted to have hydronephrosis of the right kidney and dilated proximal ureter. The distal ureter was not well seen. The patient denies any gross hematuria, new or old right flank pain, prior history of kidney stones or other kidney abnormalities. She denies any family history of kidney stones. Neurologic consultation is requested.   Past Medical History Problems  1. History of  Arthritis V13.4 2. History of  Diabetes Mellitus 250.00 3. History of  Hypercholesterolemia 272.0 4. History of  Hypertension 401.9  Surgical History Problems  1. History of  Thyroid Surgery Substernal Thyroidectomy Partial  Current Meds 1. Actos 30 MG Oral Tablet; Therapy: (Recorded:02Dec2008) to 2. Diovan HCT 160-25 MG Oral Tablet; Therapy: (Recorded:02Dec2008) to 3. Furosemide 20 MG Oral Tablet; Therapy: (Recorded:02Dec2008) to 4. Glimepiride 4 MG Oral Tablet; Therapy: (Recorded:02Dec2008) to 5. Lipitor 40 MG Oral Tablet; Therapy: (Recorded:02Dec2008) to 6. MetFORMIN HCl 1000 MG Oral Tablet; Therapy: (Recorded:02Dec2008) to  Allergies Medication  1. No Known Drug Allergies  Family History Problems  1. Family history of  Coronary Artery Disease 2. Family history of  Lung Cancer V16.1  Social History Problems    Caffeine Use Denied    Alcohol Use   Tobacco Use  Review of Systems Genitourinary, constitutional, skin, eye, otolaryngeal, hematologic/lymphatic, cardiovascular, pulmonary, endocrine, musculoskeletal, gastrointestinal, neurological and psychiatric system(s) were reviewed and pertinent findings if present are noted.  Genitourinary: urinary urgency and nocturia.  Cardiovascular: leg swelling, but no chest pain.  Musculoskeletal:  back pain and joint pain.    Vitals Vital Signs [Data Includes: Last 1 Day]  11Mar2014 02:24PM  BMI Calculated: 33.32 BSA Calculated: 1.98 Height: 5 ft 5 in Weight: 200 lb  Blood Pressure: 148 / 71 Temperature: 97.8 F Heart Rate: 68  Physical Exam Constitutional: Well nourished and well developed . No acute distress.  ENT:. The ears and nose are normal in appearance.  Neck: The appearance of the neck is normal and no neck mass is present.  Pulmonary: No respiratory distress and normal respiratory rhythm and effort.  Cardiovascular: Heart rate and rhythm are normal . No peripheral edema.  Abdomen: The abdomen is obese. The abdomen is soft and nontender. No masses are palpated. No CVA tenderness. No hernias are palpable. No hepatosplenomegaly noted.  Genitourinary:  Chaperone Present: Vivi Ferns.  Examination of the external genitalia shows normal female external genitalia and no lesions. The urethra is normal in appearance. There is no urethral mass. Vaginal exam demonstrates the vaginal epithelium to be poorly estrogenized, but no abnormalities. A cystocele is present (grade 2 /4). A rectocele is present (grade 2 /4). The cervix is is without abnormalities. The uterus is without abnormalities.  There is moderate uterine descent. The bladder is normal on palpation and non tender. The anus is normal on inspection. The perineum is normal on inspection.  Lymphatics: The femoral and inguinal nodes are not enlarged or tender.  Skin: Normal skin turgor, no visible rash and no visible skin lesions.  Neuro/Psych:. Mood and affect are appropriate.    Results/Data Urine [Data Includes: Last 1 Day]   11Mar2014  COLOR YELLOW   APPEARANCE CLEAR   SPECIFIC GRAVITY 1.010   pH 5.5   GLUCOSE 100 mg/dL  BILIRUBIN NEG   KETONE NEG mg/dL  BLOOD NEG  PROTEIN NEG mg/dL  UROBILINOGEN 0.2 mg/dL  NITRITE NEG   LEUKOCYTE ESTERASE SMALL   SQUAMOUS EPITHELIAL/HPF FEW   WBC 3-6 WBC/hpf  RBC NONE  SEEN RBC/hpf  BACTERIA RARE   CRYSTALS NONE SEEN   CASTS NONE SEEN   Other MUCUS    The following images/tracing/specimen were independently visualized:  Urinalysis revealed 3-6 white cells per high power field, no significant hematuria.    Assessment Assessed  1. Hydronephrosis On The Right 591 2. 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 Uterine Prolapse 618.1   1. Right sided hydronephrosis. I do not know the etiology of this at the present time. This may well be at lease part of the cause of the patient's renal insufficiency. She is asymptomatic from the hydronephrosis, however.  2. Chronic renal insufficiency in a 72 year old female who is not in the best medical shape  3. Pelvic prolapse. This is relatively asymptomatic. I doubt that this is the cause of patient's hydronephrosis   Plan Hydronephrosis On The Right (591)  1. CT-ABD/PELVIS W/O CONTRAST  Requested for: 11Mar2014 2. UA With REFLEX  Done: 11Mar2014 02:46PM 3. Follow-up Pending Results Office  Follow-up  Requested for: 11Mar2014  Discussion/Summary  1. I will set up the patient to have a noncontrasted CT of the abdomen and pelvis. I will notify her of results  2. If necessary, we may proceed with cystoscopy and retrograde depending on results of the CT scan  3. I do not think anything needs to be done about the prolapse at the present time  CC: Dr. Fausto Skillern, Dr. Lysbeth Galas

## 2012-11-03 ENCOUNTER — Encounter (HOSPITAL_COMMUNITY): Payer: Self-pay | Admitting: Anesthesiology

## 2012-11-03 ENCOUNTER — Ambulatory Visit (HOSPITAL_COMMUNITY)
Admission: RE | Admit: 2012-11-03 | Discharge: 2012-11-03 | Disposition: A | Discharge status: Home / Self Care | Payer: Medicare Other | Source: Ambulatory Visit | Attending: General Surgery | Admitting: General Surgery

## 2012-11-03 ENCOUNTER — Ambulatory Visit (HOSPITAL_COMMUNITY): Payer: Medicare Other

## 2012-11-03 ENCOUNTER — Encounter (HOSPITAL_COMMUNITY): Payer: Self-pay | Admitting: *Deleted

## 2012-11-03 ENCOUNTER — Ambulatory Visit (HOSPITAL_COMMUNITY): Payer: Medicare Other | Admitting: Anesthesiology

## 2012-11-03 ENCOUNTER — Encounter (HOSPITAL_COMMUNITY)
Admission: RE | Disposition: A | Discharge status: Home / Self Care | Payer: Self-pay | Source: Ambulatory Visit | Attending: General Surgery

## 2012-11-03 DIAGNOSIS — N1330 Unspecified hydronephrosis: Secondary | ICD-10-CM | POA: Insufficient documentation

## 2012-11-03 DIAGNOSIS — E119 Type 2 diabetes mellitus without complications: Secondary | ICD-10-CM | POA: Insufficient documentation

## 2012-11-03 DIAGNOSIS — C8596 Non-Hodgkin lymphoma, unspecified, intrapelvic lymph nodes: Secondary | ICD-10-CM

## 2012-11-03 DIAGNOSIS — I10 Essential (primary) hypertension: Secondary | ICD-10-CM | POA: Insufficient documentation

## 2012-11-03 DIAGNOSIS — N135 Crossing vessel and stricture of ureter without hydronephrosis: Secondary | ICD-10-CM | POA: Insufficient documentation

## 2012-11-03 DIAGNOSIS — C8589 Other specified types of non-Hodgkin lymphoma, extranodal and solid organ sites: Secondary | ICD-10-CM | POA: Insufficient documentation

## 2012-11-03 DIAGNOSIS — Z01812 Encounter for preprocedural laboratory examination: Secondary | ICD-10-CM | POA: Insufficient documentation

## 2012-11-03 DIAGNOSIS — Z01810 Encounter for preprocedural cardiovascular examination: Secondary | ICD-10-CM | POA: Insufficient documentation

## 2012-11-03 DIAGNOSIS — E7800 Pure hypercholesterolemia, unspecified: Secondary | ICD-10-CM | POA: Insufficient documentation

## 2012-11-03 HISTORY — PX: CYSTOSCOPY W/ URETERAL STENT PLACEMENT: SHX1429

## 2012-11-03 HISTORY — PX: PORTACATH PLACEMENT: SHX2246

## 2012-11-03 LAB — GLUCOSE, CAPILLARY: Glucose-Capillary: 135 mg/dL — ABNORMAL HIGH (ref 70–99)

## 2012-11-03 SURGERY — CYSTOSCOPY, WITH RETROGRADE PYELOGRAM AND URETERAL STENT INSERTION
Anesthesia: Monitor Anesthesia Care | Site: Chest | Laterality: Right | Wound class: Clean Contaminated

## 2012-11-03 MED ORDER — LACTATED RINGERS IV SOLN
INTRAVENOUS | Status: DC
  Administered 2012-11-03: 10:00:00 via INTRAVENOUS

## 2012-11-03 MED ORDER — LIDOCAINE HCL (PF) 1 % IJ SOLN
INTRAMUSCULAR | Status: AC
  Filled 2012-11-03: qty 5

## 2012-11-03 MED ORDER — PROPOFOL INFUSION 10 MG/ML OPTIME
INTRAVENOUS | Status: DC | PRN
  Administered 2012-11-03 (×3): 50 ug/kg/min via INTRAVENOUS

## 2012-11-03 MED ORDER — BACITRACIN-NEOMYCIN-POLYMYXIN 400-5-5000 EX OINT
TOPICAL_OINTMENT | CUTANEOUS | Status: DC | PRN
  Administered 2012-11-03: 1 via TOPICAL

## 2012-11-03 MED ORDER — PROPOFOL 10 MG/ML IV EMUL
INTRAVENOUS | Status: AC
  Filled 2012-11-03: qty 20

## 2012-11-03 MED ORDER — LACTATED RINGERS IV SOLN
INTRAVENOUS | Status: DC | PRN
  Administered 2012-11-03 (×2): via INTRAVENOUS

## 2012-11-03 MED ORDER — CEFAZOLIN SODIUM-DEXTROSE 2-3 GM-% IV SOLR
INTRAVENOUS | Status: DC | PRN
  Administered 2012-11-03: 2 g via INTRAVENOUS

## 2012-11-03 MED ORDER — FENTANYL CITRATE 0.05 MG/ML IJ SOLN
25.0000 ug | INTRAMUSCULAR | Status: DC | PRN
  Administered 2012-11-03: 25 ug via INTRAVENOUS

## 2012-11-03 MED ORDER — FENTANYL CITRATE 0.05 MG/ML IJ SOLN
INTRAMUSCULAR | Status: AC
  Filled 2012-11-03: qty 2

## 2012-11-03 MED ORDER — BACITRACIN-NEOMYCIN-POLYMYXIN 400-5-5000 EX OINT
TOPICAL_OINTMENT | CUTANEOUS | Status: AC
  Filled 2012-11-03: qty 1

## 2012-11-03 MED ORDER — MIDAZOLAM HCL 2 MG/2ML IJ SOLN
1.0000 mg | INTRAMUSCULAR | Status: DC | PRN
  Administered 2012-11-03: 2 mg via INTRAVENOUS
  Administered 2012-11-03: 1 mg via INTRAVENOUS

## 2012-11-03 MED ORDER — MIDAZOLAM HCL 2 MG/2ML IJ SOLN
INTRAMUSCULAR | Status: AC
  Filled 2012-11-03: qty 2

## 2012-11-03 MED ORDER — LIDOCAINE HCL (PF) 1 % IJ SOLN
INTRAMUSCULAR | Status: AC
  Filled 2012-11-03: qty 30

## 2012-11-03 MED ORDER — HEPARIN SODIUM (PORCINE) 1000 UNIT/ML IJ SOLN
INTRAMUSCULAR | Status: AC
  Filled 2012-11-03: qty 1

## 2012-11-03 MED ORDER — HEPARIN SOD (PORK) LOCK FLUSH 100 UNIT/ML IV SOLN
INTRAVENOUS | Status: AC
  Filled 2012-11-03: qty 5

## 2012-11-03 MED ORDER — STERILE WATER FOR IRRIGATION IR SOLN
Status: DC | PRN
  Administered 2012-11-03: 3000 mL

## 2012-11-03 MED ORDER — IOHEXOL 350 MG/ML SOLN
INTRAVENOUS | Status: DC | PRN
  Administered 2012-11-03: 50 mL

## 2012-11-03 MED ORDER — CEFAZOLIN SODIUM-DEXTROSE 2-3 GM-% IV SOLR
INTRAVENOUS | Status: AC
  Filled 2012-11-03: qty 50

## 2012-11-03 MED ORDER — HEPARIN (PORCINE) IN NACL 2-0.9 UNIT/ML-% IJ SOLN
INTRAMUSCULAR | Status: DC | PRN
  Administered 2012-11-03: 500 mL

## 2012-11-03 MED ORDER — SUCCINYLCHOLINE CHLORIDE 20 MG/ML IJ SOLN
INTRAMUSCULAR | Status: AC
  Filled 2012-11-03: qty 1

## 2012-11-03 MED ORDER — LIDOCAINE VISCOUS 2 % MT SOLN
OROMUCOSAL | Status: AC
  Filled 2012-11-03: qty 15

## 2012-11-03 MED ORDER — HEPARIN SOD (PORK) LOCK FLUSH 100 UNIT/ML IV SOLN
INTRAVENOUS | Status: DC | PRN
  Administered 2012-11-03: 500 [IU]

## 2012-11-03 MED ORDER — LIDOCAINE HCL (CARDIAC) 10 MG/ML IV SOLN
INTRAVENOUS | Status: DC | PRN
  Administered 2012-11-03: 50 mg via INTRAVENOUS

## 2012-11-03 MED ORDER — MIDAZOLAM HCL 5 MG/5ML IJ SOLN
INTRAMUSCULAR | Status: DC | PRN
  Administered 2012-11-03 (×2): 1 mg via INTRAVENOUS

## 2012-11-03 MED ORDER — LIDOCAINE HCL (PF) 1 % IJ SOLN
INTRAMUSCULAR | Status: DC | PRN
  Administered 2012-11-03: 10 mL

## 2012-11-03 MED ORDER — ONDANSETRON HCL 4 MG/2ML IJ SOLN: 4.0000 mg | Freq: Once | INTRAMUSCULAR | Status: DC | PRN

## 2012-11-03 MED ORDER — FENTANYL CITRATE 0.05 MG/ML IJ SOLN: 25.0000 ug | INTRAMUSCULAR | Status: DC | PRN

## 2012-11-03 MED ORDER — CEFAZOLIN SODIUM-DEXTROSE 2-3 GM-% IV SOLR: 2.0000 g | Freq: Once | INTRAVENOUS | Status: DC

## 2012-11-03 MED ORDER — FENTANYL CITRATE 0.05 MG/ML IJ SOLN
INTRAMUSCULAR | Status: DC | PRN
  Administered 2012-11-03 (×3): 25 ug via INTRAVENOUS

## 2012-11-03 SURGICAL SUPPLY — 57 items
APPLIER CLIP 9.375 SM OPEN (CLIP)
APR CLP SM 9.3 20 MLT OPN (CLIP)
BAG DECANTER FOR FLEXI CONT (MISCELLANEOUS) ×4 IMPLANT
BAG DRAIN URO TABLE W/ADPT NS (DRAPE) ×2 IMPLANT
BAG DRN 8 ADPR NS SKTRN CSTL (DRAPE)
BAG HAMPER (MISCELLANEOUS) ×8 IMPLANT
CATH URET 5FR 28IN OPEN ENDED (CATHETERS) ×4 IMPLANT
CLIP APPLIE 9.375 SM OPEN (CLIP) IMPLANT
CLOTH BEACON ORANGE TIMEOUT ST (SAFETY) ×10 IMPLANT
COVER LIGHT HANDLE STERIS (MISCELLANEOUS) ×8 IMPLANT
COVER MAYO STAND XLG (DRAPE) ×2 IMPLANT
DECANTER SPIKE VIAL GLASS SM (MISCELLANEOUS) ×4 IMPLANT
DRAPE C-ARM FOLDED MOBILE STRL (DRAPES) ×6 IMPLANT
DRSG TEGADERM 2-3/8X2-3/4 SM (GAUZE/BANDAGES/DRESSINGS) ×6 IMPLANT
DURAPREP 26ML APPLICATOR (WOUND CARE) ×4 IMPLANT
ELECT REM PT RETURN 9FT ADLT (ELECTROSURGICAL) ×4
ELECTRODE REM PT RTRN 9FT ADLT (ELECTROSURGICAL) ×3 IMPLANT
GAUZE SPONGE 4X4 16PLY XRAY LF (GAUZE/BANDAGES/DRESSINGS) ×2 IMPLANT
GLOVE BIOGEL PI IND STRL 7.0 (GLOVE) ×3 IMPLANT
GLOVE BIOGEL PI INDICATOR 7.0 (GLOVE) ×3
GLOVE ECLIPSE 6.5 STRL STRAW (GLOVE) ×6 IMPLANT
GLOVE EXAM NITRILE MD LF STRL (GLOVE) ×4 IMPLANT
GLOVE SKINSENSE NS SZ7.0 (GLOVE) ×1
GLOVE SKINSENSE STRL SZ7.0 (GLOVE) ×3 IMPLANT
GLOVE SURG SS PI 8.0 STRL IVOR (GLOVE) ×4 IMPLANT
GOWN STRL REIN XL XLG (GOWN DISPOSABLE) ×14 IMPLANT
GUIDEWIRE STR DUAL SENSOR (WIRE) ×4 IMPLANT
IV NS 500ML (IV SOLUTION) ×4
IV NS 500ML BAXH (IV SOLUTION) ×3 IMPLANT
IV NS IRRIG 3000ML ARTHROMATIC (IV SOLUTION) IMPLANT
KIT PORT POWER 8FR ISP MRI (CATHETERS) ×4 IMPLANT
KIT ROOM TURNOVER AP CYSTO (KITS) ×4 IMPLANT
KIT ROOM TURNOVER APOR (KITS) ×4 IMPLANT
MANIFOLD NEPTUNE II (INSTRUMENTS) ×8 IMPLANT
NDL HYPO 18GX1.5 BLUNT FILL (NEEDLE) ×2 IMPLANT
NDL HYPO 25X1 1.5 SAFETY (NEEDLE) ×2 IMPLANT
NEEDLE HYPO 18GX1.5 BLUNT FILL (NEEDLE) ×4 IMPLANT
NEEDLE HYPO 25X1 1.5 SAFETY (NEEDLE) ×4 IMPLANT
PACK CYSTO (CUSTOM PROCEDURE TRAY) ×4 IMPLANT
PACK MINOR (CUSTOM PROCEDURE TRAY) ×4 IMPLANT
PAD ARMBOARD 7.5X6 YLW CONV (MISCELLANEOUS) ×8 IMPLANT
SET BASIN LINEN APH (SET/KITS/TRAYS/PACK) ×4 IMPLANT
SHEATH COOK PEEL AWAY SET 8F (SHEATH) IMPLANT
SPONGE GAUZE 2X2 8PLY STRL LF (GAUZE/BANDAGES/DRESSINGS) ×6 IMPLANT
STENT URET 6FRX24 CONTOUR (STENTS) ×2 IMPLANT
STRIP CLOSURE SKIN 1/4X3 (GAUZE/BANDAGES/DRESSINGS) ×4 IMPLANT
SUT VIC AB 4-0 SH 27 (SUTURE) ×4
SUT VIC AB 4-0 SH 27XBRD (SUTURE) ×3 IMPLANT
SUT VIC AB 5-0 P-3 18X BRD (SUTURE) ×3 IMPLANT
SUT VIC AB 5-0 P3 18 (SUTURE) ×4
SYR 20CC LL (SYRINGE) ×4 IMPLANT
SYR 5ML LL (SYRINGE) ×8 IMPLANT
SYR BULB IRRIGATION 50ML (SYRINGE) ×4 IMPLANT
SYR CONTROL 10ML LL (SYRINGE) ×4 IMPLANT
TOWEL OR 17X26 4PK STRL BLUE (TOWEL DISPOSABLE) ×4 IMPLANT
WATER STERILE IRR 1000ML POUR (IV SOLUTION) ×12 IMPLANT
WATER STERILE IRR 3000ML UROMA (IV SOLUTION) ×6 IMPLANT

## 2012-11-03 NOTE — Progress Notes (Signed)
Post OP Check  Awake and alert.  Minimal discomfort.  Dressings dry and in tact.  CXR shows cath tip in SVC no pnemothorax on left or right. Side.    Filed Vitals:   11/03/12 1415  BP:   Pulse: 62  Temp:   Resp: 15  BP 158/79 97.4  O2 sat 97% RA.  Discharge and follow up arranged.

## 2012-11-03 NOTE — Anesthesia Procedure Notes (Signed)
Procedure Name: MAC Performed by: Carolyne Littles, AMY L Pre-anesthesia Checklist: Patient identified, Timeout performed, Emergency Drugs available, Suction available and Patient being monitored Oxygen Delivery Method: Non-rebreather mask

## 2012-11-03 NOTE — Brief Op Note (Signed)
11/03/2012  1:53 PM  PATIENT:  [name] [name]  72 y.o. female  PRE-OPERATIVE DIAGNOSIS:  non-hodgkins lymphoma, right hydronephrosis  POST-OPERATIVE DIAGNOSIS:  non-hodgkins lymphoma, right hydronephrosis  PROCEDURE:  Procedure(s) with comments: CYSTOSCOPY WITH RETROGRADE PYELOGRAM/URETERAL STENT PLACEMENT (Right) INSERTION PORT-A-CATH (Left) - Insertion Port-A-Cath Left Subclavian  SURGEON:  Surgeon(s) and Role: Panel 1:    * Anner Crete, MD - Primary  Panel 2:    * Marlane Hatcher, MD - Primary  PHYSICIAN ASSISTANT:   ASSISTANTS: none   ANESTHESIA:   IV sedation  EBL:  Total I/O In: 1000 [I.V.:1000] Out: 15 [Blood:15]  BLOOD ADMINISTERED:none  DRAINS: none   LOCAL MEDICATIONS USED:  LIDOCAINE 0.5%  ~ 10 cc.  SPECIMEN:  No Specimen  DISPOSITION OF SPECIMEN:  N/A  COUNTS:  YES  TOURNIQUET:  * No tourniquets in log *  DICTATION: .Other Dictation: Dictation Number OR dict. # [id number].  PLAN OF CARE: Discharge to home after PACU  PATIENT DISPOSITION:  PACU - hemodynamically stable.   Delay start of Pharmacological VTE agent (>24hrs) due to surgical blood loss or risk of bleeding: not applicable

## 2012-11-03 NOTE — Preoperative (Signed)
Beta Blockers   Reason not to administer Beta Blockers:Not Applicable 

## 2012-11-03 NOTE — Interval H&P Note (Signed)
History and Physical Interval Note:  11/03/2012 11:55 AM  [name] [name]  has presented today for surgery, with the diagnosis of non-hodgkins lymphoma, right hydronephrosis  The various methods of treatment have been discussed with the patient and family. After consideration of risks, benefits and other options for treatment, the patient has consented to  Procedure(s): INSERTION PORT-A-CATH (N/A) CYSTOSCOPY WITH RETROGRADE PYELOGRAM/URETERAL STENT PLACEMENT (Right) as a surgical intervention .  The patient's history has been reviewed, patient examined, no change in status, stable for surgery.  I have reviewed the patient's chart and labs.  Questions were answered to the patient's satisfaction.     [name] J

## 2012-11-03 NOTE — Transfer of Care (Signed)
Immediate Anesthesia Transfer of Care Note  Patient: [name] [name]  Procedure(s) Performed: Procedure(s) with comments: CYSTOSCOPY WITH RETROGRADE PYELOGRAM/URETERAL STENT PLACEMENT (Right) INSERTION PORT-A-CATH (Left) - Insertion Port-A-Cath Left Subclavian  Patient Location: PACU  Anesthesia Type:MAC  Level of Consciousness: awake, alert , oriented and patient cooperative  Airway & Oxygen Therapy: Patient Spontanous Breathing and Patient connected to face mask oxygen  Post-op Assessment: Report given to PACU RN and Post -op Vital signs reviewed and stable  Post vital signs: Reviewed and stable  Complications: No apparent anesthesia complications

## 2012-11-03 NOTE — Anesthesia Preprocedure Evaluation (Signed)
Anesthesia Evaluation  Patient identified by MRN, date of birth, ID band Patient awake    Reviewed: Allergy & Precautions, H&P , NPO status , Patient's Chart, lab work & pertinent test results  Airway Mallampati: III TM Distance: >3 FB Neck ROM: Full  Mouth opening: Limited Mouth Opening  Dental  (+) Edentulous Upper and Edentulous Lower   Pulmonary neg pulmonary ROS,  breath sounds clear to auscultation        Cardiovascular hypertension, Rhythm:Regular Rate:Normal     Neuro/Psych    GI/Hepatic   Endo/Other  diabetes, Well Controlled, Type 2, Oral Hypoglycemic AgentsMorbid obesity  Renal/GU      Musculoskeletal   Abdominal   Peds  Hematology   Anesthesia Other Findings   Reproductive/Obstetrics                           Anesthesia Physical Anesthesia Plan  ASA: III  Anesthesia Plan: MAC   Post-op Pain Management:    Induction: Intravenous  Airway Management Planned: Simple Face Mask  Additional Equipment:   Intra-op Plan:   Post-operative Plan:   Informed Consent: I have reviewed the patients History and Physical, chart, labs and discussed the procedure including the risks, benefits and alternatives for the proposed anesthesia with the patient or authorized representative who has indicated his/her understanding and acceptance.     Plan Discussed with:   Anesthesia Plan Comments:         Anesthesia Quick Evaluation

## 2012-11-03 NOTE — Progress Notes (Signed)
52 yr. Old W. Female for placement of [porta cath for chemo therapy for non hodgkin's lymphoma.  Procedure and risks explained and informed consent obtained.  Labs reviewed and there is no change since H&P; dict # [id number]. Filed Vitals:   11/03/12 1035  BP: 144/54  Temp:   Resp: 55  temp 98.1.  Note: following placement of catheter Dr. Annabell Howells will place stents for hydronephrosis due to tumor compression.

## 2012-11-03 NOTE — OR Nursing (Signed)
Dr. Malvin Johns attempted Port-A-Cath placement on patient's right chest, start time at 1258; at 70 Dr. Malvin Johns moved to left side of chest for completion of procedure

## 2012-11-03 NOTE — Anesthesia Postprocedure Evaluation (Signed)
  Anesthesia Post-op Note  Patient: [name] [name]  Procedure(s) Performed: Procedure(s) with comments: CYSTOSCOPY WITH RETROGRADE PYELOGRAM/URETERAL STENT PLACEMENT (Right) INSERTION PORT-A-CATH (Left) - Insertion Port-A-Cath Left Subclavian  Patient Location: PACU  Anesthesia Type:MAC  Level of Consciousness: awake, alert , oriented and patient cooperative  Airway and Oxygen Therapy: Patient Spontanous Breathing and Patient connected to face mask oxygen  Post-op Pain: mild  Post-op Assessment: Post-op Vital signs reviewed, Patient's Cardiovascular Status Stable, Respiratory Function Stable, Patent Airway, No signs of Nausea or vomiting and Pain level controlled  Post-op Vital Signs: Reviewed and stable  Complications: No apparent anesthesia complications

## 2012-11-03 NOTE — Brief Op Note (Signed)
11/03/2012  12:36 PM  PATIENT:  [name] [name]  72 y.o. female  PRE-OPERATIVE DIAGNOSIS:  non-hodgkins lymphoma, right hydronephrosis  POST-OPERATIVE DIAGNOSIS:  non-hodgkins lymphoma, right hydronephrosis  PROCEDURE:  Procedure(s): INSERTION PORT-A-CATH (N/A) CYSTOSCOPY WITH RETROGRADE PYELOGRAM/URETERAL STENT PLACEMENT (Right)  SURGEON:  Surgeon(s) and Role: Panel 1:    * Marlane Hatcher, MD - Primary  Panel 2:    * Anner Crete, MD - Primary  PHYSICIAN ASSISTANT:   ASSISTANTS: none   ANESTHESIA:   general  EBL:  Total I/O In: 500 [I.V.:500] Out: -   BLOOD ADMINISTERED:none  DRAINS: 6x24 right JJ stent.    LOCAL MEDICATIONS USED:  NONE  SPECIMEN:  No Specimen  DISPOSITION OF SPECIMEN:  N/A  COUNTS:  YES  TOURNIQUET:  * No tourniquets in log *  DICTATION: .Other Dictation: Dictation Number [id number]  PLAN OF CARE: Discharge to home after PACU  PATIENT DISPOSITION:  PACU - hemodynamically stable.   Delay start of Pharmacological VTE agent (>24hrs) due to surgical blood loss or risk of bleeding: not applicable

## 2012-11-04 NOTE — Op Note (Signed)
NAMEMALISSA, [name] [name]                ACCOUNT NO.:  [id number]  MEDICAL RECORD NO.:  [id number]  LOCATION:  [facility]                          FACILITY:  [facility]  PHYSICIAN:  Excell Seltzer. Annabell Howells, M.D.    DATE OF BIRTH:  10-01-1940  DATE OF PROCEDURE:  11/03/2012 DATE OF DISCHARGE:  11/03/2012                              OPERATIVE REPORT   PROCEDURE:  Cystoscopy, right retrograde pyelogram, insertion of right double-J stent.  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  SURGEON:  Excell Seltzer. Annabell Howells, MD  ANESTHESIA:  MAC.  SPECIMEN:  None.  DRAINS:  A 6-French 24-cm double-J stent.  COMPLICATIONS:  None.  INDICATIONS:  [name] is a 72 year old white female, who has right hydronephrosis from a large retroperitoneal mass and renal insufficiency.  She is to start chemotherapy for treatment and  it was felt by Dr. Mariel Sleet that decompression of the right kidney may be of value to try and improve her renal function prior to chemotherapy.  FINDINGS OF PROCEDURE:  She was taken to the operating room where she was placed in a lithotomy position and fitted with PAS hose.  Sedation was given as needed.  Her perineum and genitalia were prepped with Betadine solution.  She was draped in usual sterile fashion.  She had been given 2 g of Ancef as well.  Cystoscopy was performed using a 22- [place] scope and a 30-degree lens.  Examination revealed a normal urethra.  The bladder wall was smooth and pale without tumor, stones, or inflammation.  Ureteral orifices were unremarkable.  The right ureteral orifice was cannulated with 5-French open-end catheter and contrast was instilled.  This revealed an irregular narrowed area approximately 3 cm in length about 2-3 cm proximal to the meatus.  Above this, there was marked hydronephrosis but the dye did pass into the proximal ureter.  Once  retrograde pyelogram was complete, a Sensor guidewire was advanced to the kidney.  The 5-French open-end catheter had to be passed over the wire to help stiffen it to get it around the tortuosity in the proximal ureter.  Once the wire was in the kidney, the open-end catheter was removed and a 6-French 24 cm contoured double-J stent was inserted over the wire to the kidney under fluoroscopic guidance.  Wire was removed leaving good coil in the kidney and a good coil in the bladder.  The bladder was drained.  The patient was taken down from lithotomy position.  She was then handed off to Dr. Malvin Johns who is placing a Port-A-Cath today.  There were no complications.     Excell Seltzer. Annabell Howells, M.D.     JJW/MEDQ  D:  11/03/2012  T:  11/04/2012  Job:  [id number]  cc:   Ladona Horns. Mariel Sleet, MD Fax: [phone]

## 2012-11-04 NOTE — Op Note (Signed)
NAMEMALAJAH, OCEGUERA NO.:  [id number]  MEDICAL RECORD NO.:  [id number]  LOCATION:  [facility]                          FACILITY:  [facility]  PHYSICIAN:  Barbaraann Barthel, M.D. DATE OF BIRTH:  1941/07/03  DATE OF PROCEDURE:  11/03/2012 DATE OF DISCHARGE:  11/03/2012                              OPERATIVE REPORT   SURGEON:  Barbaraann Barthel, M.D.  PREOPERATIVE DIAGNOSIS:  Non-Hodgkin lymphoma.  POSTOPERATIVE DIAGNOSIS:  Non-Hodgkin lymphoma.  PROCEDURE:  Placement of left subclavian Port-A-Cath under fluoroscopy.  WOUND CLASSIFICATION:  Clean.  SPECIMEN:  None.  NOTE:  This is a 72 year old obese, white female who had diagnosis of non-Hodgkin lymphoma.  She was referred for placement of Port-A-Cath. We discussed complications, not limited to, but including bleeding, infection, thrombosis, pneumothorax, and catheter embolization with her and informed consent was obtained.  GROSS OPERATIVE FINDINGS:  There was difficult placement on the right side where I initially tried under fluoroscopy in the right subclavian vein, we were unable to cannulate the subclavian vein.  The patient, however, did not clinically have any problems while injecting.  The O2 sat remained unchanged, so I proceeded to try on the left side, which was successful on the initial puncture.  We then threaded the silastic catheter over the guidewire after placing the venous dilator and using the Seldinger technique, we watched under fluoroscopy.  The silastic catheter being placed in the superior vena cava without problems.  The guidewire was removed and we then connected the catheter to the previously irrigated infusion device, which we then aspirated and then flushed with heparinized saline.  We then irrigated the wound, checked for hemostasis, which required a little bit of cauterization and then I closed the wound with a 4-0 Polysorb for the subcutaneous layer and a 5- 0 Polysorb for the  subcuticular layer. Steri-Strips, Neosporin, and OpSite dressing with a 2x2 were applied. Prior to closure, all sponge, needle, and instrument counts were found to be correct.  Estimated blood loss was minimal.  No drain was placed and there were no complications.  We are awaiting chest x-ray to rule out pneumothorax and to confirm catheter tip position.     Barbaraann Barthel, M.D.     WB/MEDQ  D:  11/03/2012  T:  11/04/2012  Job:  [id number]  cc:   Ladona Horns. Mariel Sleet, MD Fax: [phone]

## 2012-11-06 ENCOUNTER — Encounter (HOSPITAL_COMMUNITY): Admission: RE | Admit: 2012-11-06 | Payer: Medicare Other | Source: Ambulatory Visit

## 2012-11-07 ENCOUNTER — Encounter (HOSPITAL_COMMUNITY): Payer: Self-pay | Admitting: Urology

## 2012-11-07 ENCOUNTER — Ambulatory Visit (HOSPITAL_COMMUNITY)
Admission: RE | Admit: 2012-11-07 | Discharge: 2012-11-07 | Disposition: A | Discharge status: Home / Self Care | Payer: Medicare Other | Source: Ambulatory Visit | Attending: Oncology | Admitting: Oncology

## 2012-11-07 DIAGNOSIS — C8590 Non-Hodgkin lymphoma, unspecified, unspecified site: Secondary | ICD-10-CM

## 2012-11-07 DIAGNOSIS — K8020 Calculus of gallbladder without cholecystitis without obstruction: Secondary | ICD-10-CM | POA: Insufficient documentation

## 2012-11-07 DIAGNOSIS — K5730 Diverticulosis of large intestine without perforation or abscess without bleeding: Secondary | ICD-10-CM | POA: Insufficient documentation

## 2012-11-07 DIAGNOSIS — R599 Enlarged lymph nodes, unspecified: Secondary | ICD-10-CM | POA: Insufficient documentation

## 2012-11-07 DIAGNOSIS — I2510 Atherosclerotic heart disease of native coronary artery without angina pectoris: Secondary | ICD-10-CM | POA: Insufficient documentation

## 2012-11-07 DIAGNOSIS — I517 Cardiomegaly: Secondary | ICD-10-CM | POA: Insufficient documentation

## 2012-11-07 DIAGNOSIS — I700 Atherosclerosis of aorta: Secondary | ICD-10-CM | POA: Insufficient documentation

## 2012-11-07 DIAGNOSIS — C8589 Other specified types of non-Hodgkin lymphoma, extranodal and solid organ sites: Secondary | ICD-10-CM | POA: Insufficient documentation

## 2012-11-07 LAB — GLUCOSE, CAPILLARY: Glucose-Capillary: 133 mg/dL — ABNORMAL HIGH (ref 70–99)

## 2012-11-07 MED ORDER — FLUDEOXYGLUCOSE F - 18 (FDG) INJECTION
18.4000 | Freq: Once | INTRAVENOUS | Status: AC | PRN
  Administered 2012-11-07: 18.4 via INTRAVENOUS

## 2012-11-17 ENCOUNTER — Ambulatory Visit (INDEPENDENT_AMBULATORY_CARE_PROVIDER_SITE_OTHER): Payer: Medicare Other | Admitting: Urology

## 2012-11-17 DIAGNOSIS — N1330 Unspecified hydronephrosis: Secondary | ICD-10-CM

## 2012-11-17 DIAGNOSIS — IMO0002 Reserved for concepts with insufficient information to code with codable children: Secondary | ICD-10-CM

## 2012-11-17 DIAGNOSIS — R82998 Other abnormal findings in urine: Secondary | ICD-10-CM

## 2012-11-20 ENCOUNTER — Encounter (HOSPITAL_COMMUNITY): Payer: Self-pay | Admitting: Oncology

## 2012-11-20 DIAGNOSIS — C8236 Follicular lymphoma grade IIIa, intrapelvic lymph nodes: Secondary | ICD-10-CM | POA: Insufficient documentation

## 2012-11-20 DIAGNOSIS — C8290 Follicular lymphoma, unspecified, unspecified site: Secondary | ICD-10-CM

## 2012-11-20 HISTORY — DX: Follicular lymphoma, unspecified, unspecified site: C82.90

## 2012-11-20 NOTE — Progress Notes (Addendum)
[name] Hector, MD [address] [place] [address]  Follicular lymphoma - Plan: CBC with Differential, Comprehensive metabolic panel, Lactate dehydrogenase, Sedimentation rate, Hepatitis B surface antibody, Hepatitis B surface antigen, CBC with Differential, Comprehensive metabolic panel, Lactate dehydrogenase, Sedimentation rate, Hepatitis B surface antibody, Hepatitis B surface antigen, PHYSICIAN COMMUNICATION ORDER, dexamethasone (DECADRON) 4 MG tablet, ondansetron (ZOFRAN) 8 MG tablet, allopurinol (ZYLOPRIM) 300 MG tablet, acyclovir (ZOVIRAX) 400 MG tablet  Iron deficiency anemia, unspecified - Plan: ferumoxytol (FERAHEME) 1,020 mg in sodium chloride 0.9 % 100 mL IVPB  CURRENT THERAPY: Development of Treatment plan.  INTERVAL HISTORY: [name] [name] 72 y.o. 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 AND iron deficiency anemia at time of presentation.  Hunger is S/P a right ureteral double-J stent insertion by Dr. Annabell [name] on 11/03/2012.  She is also S/P port placement by Dr. Malvin [name] on 11/03/2012 as well.  No complications noted on either procedure.  I personally reviewed and went over laboratory results with the patient.  The patient is noted to be iron deficient with a serum iron of 22, TIBC 390, %Sat 6, and ferritin 28.  As a result, we will give her Feraheme 1020 mg IV today.  Her Hgb is noted to be 10.3.  WBC and platelet count are both WNL.  Sed rate is noted to be 67 with a normal LDH.   I provided her some education of Feraheme.  We discussed the risks, benefits, alternatives and side effects of Feraheme.   The majority of our time was discussing her malignancy.  I personally reviewed and went over laboratory results with the patient. I personally reviewed and went over radiographic studies with the patient.  I personally reviewed and  went over pathology results with the patient.  We discussed chemotherapy consisting of Bendamustine and Rituxan Day 1 and Day 2 every 28 days.  I provided her education regarding each cycles.  We spent time discussing the risks, benefits, alternatives, and side effects of chemotherapy including alopecia, nausea, vomiting, diarrhea, constipation, lowering of blood counts, increased risk of infection, etc.    I discussed the case with the nurse.  We will set the patient up for chemotherapy teaching in the near future for her treatment plan.   [name] [name] had a number of simple questions including questions about her port-a-cath.  She wanted to know if chemotherapy will make her kidneys smaller which it will not.  She asked a number of questions and these were answered appropriately.  We spent time discussing her diabetes control.  She has an upcoming appointment with her PCP and we ask that he help with her diabetic control.  She was asked to adhere to a diabetic diet.   Hematologically, she denies any complaints and ROS questioning is negative.   Past Medical History  Diagnosis Date  . Hypertension   . Diabetes   . Hyperlipidemia   . 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 Iron deficiency anemia, unspecified 11/21/2012    At time of presentation.  Feraheme 1020 mg on 11/21/12.    has Follicular lymphoma and Iron deficiency anemia, unspecified on her problem list.     has No Known Allergies.  [name] [name] does not currently have medications on file.  Past Surgical History  Procedure Laterality Date  . Cataract extraction    . Cystoscopy w/ ureteral stent placement Right 11/03/2012    Procedure: CYSTOSCOPY WITH RETROGRADE  PYELOGRAM/URETERAL STENT PLACEMENT;  Surgeon: [name] Crete, MD;  Location: [facility];  Service: Urology;  Laterality: Right;  . Portacath placement Left 11/03/2012    Procedure: INSERTION PORT-A-CATH;  Surgeon: Marlane Hatcher, MD;  Location: [facility];  Service: General;  Laterality: Left;   Insertion Port-A-Cath Left Subclavian    Denies any headaches, dizziness, double vision, fevers, chills, night sweats, nausea, vomiting, diarrhea, constipation, chest pain, heart palpitations, shortness of breath, blood in stool, black tarry stool, urinary pain, urinary burning, urinary frequency, hematuria.   PHYSICAL EXAMINATION  ECOG PERFORMANCE STATUS: 1 - Symptomatic but completely ambulatory  Filed Vitals:   11/21/12 1200  BP: 137/77  Pulse: 57  Temp: 98.1 F (36.7 C)  Resp: 16    GENERAL:alert, no distress, well nourished, well developed, comfortable, cooperative, obese and smiling SKIN: skin color, texture, turgor are normal, no rashes or significant lesions HEAD: Normocephalic, No masses, lesions, tenderness or abnormalities EYES: normal, Conjunctiva are pink and non-injected EARS: External ears normal OROPHARYNX:mucous membranes are moist  NECK: supple, trachea midline LYMPH:  not examined BREAST:not examined LUNGS: not examined HEART: not examined ABDOMEN:obese BACK: Back symmetric, no curvature. EXTREMITIES:no skin discoloration, no clubbing, no cyanosis  NEURO: alert & oriented x 3 with fluent speech, no focal motor/sensory deficits, gait normal   LABORATORY DATA: CBC    Component Value Date/Time   WBC 10.5 10/31/2012 1652   RBC 4.10 10/31/2012 1652   HGB 10.3* 10/31/2012 1652   HCT 32.1* 10/31/2012 1652   PLT 343 10/31/2012 1652   MCV 78.3 10/31/2012 1652   MCH 25.1* 10/31/2012 1652   MCHC 32.1 10/31/2012 1652   RDW 17.0* 10/31/2012 1652   LYMPHSABS 2.0 10/31/2012 1652   MONOABS 0.6 10/31/2012 1652   EOSABS 0.2 10/31/2012 1652   BASOSABS 0.0 10/31/2012 1652     Chemistry      Component Value Date/Time   NA 136 10/31/2012 1652   K 4.1 10/31/2012 1652   CL 98 10/31/2012 1652   CO2 25 10/31/2012 1652   BUN 48* 10/31/2012 1652   CREATININE 2.87* 10/31/2012 1652      Component Value Date/Time   CALCIUM 9.7 10/31/2012 1652   ALKPHOS 132* 10/31/2012 1652   AST 16  10/31/2012 1652   ALT 12 10/31/2012 1652   BILITOT 0.2* 10/31/2012 1652    , Results for [name], [name] (MRN [id number]) as of 11/20/2012 17:10  Ref. Range 10/31/2012 16:52  LDH Latest Range: 94-250 U/L 212    Results for MERCED, BROUGHAM (MRN [id number]) as of 11/20/2012 17:10  Ref. Range 10/31/2012 16:52  Iron Latest Range: 42-135 ug/dL 22 (L)  UIBC Latest Range: 125-400 ug/dL 086  TIBC Latest Range: 250-470 ug/dL 578  Saturation Ratios Latest Range: 20-55 % 6 (L)  Ferritin Latest Range: 10-291 ng/mL 28  Folate No range found 16.4  Beta-2 Microglobulin Latest Range: 1.01-1.73 mg/L 6.41 (H)  Vitamin B-12 Latest Range: 211-911 pg/mL 319     RADIOGRAPHIC STUDIES:  11/07/2012  *RADIOLOGY REPORT*  Clinical Data: Initial treatment strategy for non-Hodgkins  lymphoma. Staging scan.  NUCLEAR MEDICINE PET SKULL BASE TO THIGH  Fasting Blood Glucose: 133  Technique: 18.4 mCi F-18 FDG was injected intravenously. CT data  was obtained and used for attenuation correction and anatomic  localization only. (This was not acquired as a diagnostic CT  examination.) Additional exam technical data entered on  technologist worksheet.  Comparison: CT of the abdomen and pelvis 09/22/2012.  Findings:  Neck: There are  several borderline enlarged low left cervical lymph  nodes, extending into the left supraclavicular region, where there  is a 9 mm short axis lymph node immediately anterolateral to the  proximal left common carotid and left subclavian arteries (image 55  of series 2) which is hypermetabolic (SUVmax = 8.0).  Chest: In the left subclavian region adjacent to the left  subclavian single lumen Port-A-Cath as it transitions into the  thorax there is profound hypermetabolism (SUVmax = 15.6)without any  corresponding lymphadenopathy or other abnormality on the CT  portion of the examination. This finding is favored to be  artifactual related to entrapped particulate of the injected FDG  (the  left upper extremity antecubital fossa was injected) at a site  of vascular narrowing. The only suspicious hypermetabolic activity  in the thorax is associated with a middle mediastinal lymph node  that is borderline enlarged (image 80 of series 2) measuring 10 mm  in short axis immediately posterior to the esophagus (SUVmax =  6.0). Heart size is mildly enlarged. There is no significant  pericardial fluid, thickening or pericardial calcification. There  is atherosclerosis of the thoracic aorta, the great vessels of the  mediastinum and the coronary arteries, including calcified  atherosclerotic plaque in the left main, left anterior descending,  left circumflex and right coronary arteries. 1.4 cm low attenuation  nodule in the posterior aspect of the left lobe of the thyroid  gland has no associated hypermetabolism.  Abdomen/Pelvis: No abnormal hypermetabolic activity within the  liver, pancreas, adrenal glands, or spleen. Numerous small  calcified gallstones layer dependently within the gallbladder. No  signs of acute cholecystitis at this time. Bilateral renal atrophy  is again noted. There is persistent mild dilatation of the right  renal pelvis. However, there is now a right-sided double-J  ureteral stent in place with the distal loop properly reformed  within the urinary bladder, the proximal loop properly reformed in  the right renal pelvis immediately above the right UPJ. Extensive  atherosclerosis throughout the abdominal and pelvic vasculature,  without definite aneurysm. Numerous colonic diverticula, without  surrounding inflammatory changes to suggest acute diverticulitis at  this time.  There are several prominent borderline enlarged and mildly enlarged  retroperitoneal lymph nodes (example on image 158 of series 2) with  some associated hypermetabolism (SUVmax = 5.7).Previously noted  presacral nodal mass is very similar to the prior examination  measuring up to 10.7 x 6.5  cm (measured on image 178 of series 2)  demonstrating diffuse hypermetabolism (SUVmax = 16.7).Although this  presacral mass makes a broad contact with the underlying bone,  there are no gross destructive changes in the bone at this time to  suggest bony invasion. This mass completely encases the aortic  bifurcation and the common iliac arteries bilaterally, as well as  the proximal right external and internal iliac arteries.  Skeleton: No focal hypermetabolic activity to suggest skeletal  metastasis.  IMPRESSION:  1. Large presacral hypermetabolic nodal mass with multiple  borderline enlarged and minimally enlarged but hypermetabolic lymph  nodes in the retroperitoneum, the middle mediastinum, and left  supraclavicular region, as above.  2. Cholelithiasis without findings to suggest acute cholecystitis.  3. Right-sided double-J ureteral stent appears properly located  with resolution of previously noted hydroureteronephrosis. There  is persistent fullness of the right renal pelvis which is likely to  be chronic and related to prior obstruction.  4. Mild colonic diverticulosis without findings to suggest acute  diverticulitis at this time.  5. Mild  cardiomegaly.  6. Atherosclerosis, including left main and three-vessel coronary  artery disease. Assessment for potential risk factor modification,  dietary therapy or pharmacologic therapy may be warranted, if  clinically indicated.  Original Report Authenticated By: Trudie Reed, M.D.    PATHOLOGY: 10/11/2012  Diagnosis Retroperitoneal mass, biopsy - NON-HODGKIN'S B CELL LYMPHOMA. - SEE ONCOLOGY TABLE. Microscopic Comment LYMPHOMA Histologic type: Non-Hodgkin's lymphoma, follicular center cell type. Grade (if applicable): See comment. Flow cytometry: Monoclonal, lambda restricted B cell population expressing pan B cell antigens including CD20 with associated CD10 expression ([id number]). Immunohistochemical stains: CD20, CD79a,  BCL-2, BCL-6, CD10, CD3, CD43, and Ki-67 with appropriate controls. Touch preps/imprints: Not performed. Comments: The sections show soft tissue including skeletal muscle infiltrated by a relatively dense lymphoproliferative process characterized by the presence of numerous, ill defined lymphoid follicles displaying abundance/predominance of large transformed cells and large cleaved lymphocytes. The intervening tissue displays abundance of small round to slightly irregular lymphocytes. To further evaluate this process, flow cytometric analysis and immunohistochemical stains were performed. Flow cytometric analysis shows a monoclonal B cell population expressing pan B cell antigens including CD20 with associated CD10 expression. Immunohistochemical stains show that there is predominance of B cells particularly in the follicular centers as seen with CD79a and CD20 in association with CD10, BCL-2 and BCL-6 expression. The lymphocytes between the follicles show a mixture of T and B cells. Ki-67 shows increased but variable positivity in follicular centers which focally show more than 75% expression. 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 While it is somewhat difficult to accurately grade follicular lymphoma in small biopsy material, the relative abundance of large lymphoid cells present and the increased Ki-67 positivity within the atypical follicles favor a high grade process. (BNS:gt, 10/16/12) Guerry Bruin MD Pathologist, Electronic Signature (Case signed 10/16/2012)    10/11/2012  Interpretation Tissue-Flow Cytometry MONOCLONAL B CELL POPULATION IDENTIFIED. Diagnosis Comment: The findings are consistent with Non-Hodgkin's B cell lymphoma. (BNS:gt, 10/16/12) Guerry Bruin MD Pathologist, Electronic Signature (Case signed 10/16/2012)     ASSESSMENT:  1. 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 Potentially the cause of right back pain, right flank pain and lymphedema in both legs.  2. Diabetes mellitus not in the best control. She does not follow a diabetic diet well whatsoever.  3. Obesity  4. Degenerative joint disease.  5. History of anemia  6. Venous insufficiency changes of her lower extremities   PLAN:  1. I personally reviewed and went over laboratory results with the patient. 2. I personally reviewed and went over radiographic studies with the patient. 3. I personally reviewed and went over pathology results with the patient. 4. Feraheme 1020 mg IV today as scheduled. 5. Diabetic education.  Patient education regarding strict diabetic control. 6. Encourage follow-up appointment with PCP in the very near future to help with Diabetes control. 7. Labs today: CBC diff, CMET, LDH, ESR, Hepatitis B antibody and antigen.  8. Chemotherapy plan developed consisting of Bendamustine D 1 and D2 and Rituxan on D1. 9. Risks, benefits, alternatives, and side effects of chemotherapy discussed. 10. Cycle 1 of chemotherapy scheduled for Tuesday 11. Will restage with CT scans following 3 cycles of chemotherapy.  12. Return in 4 weeks for follow-up.    All questions were answered. The patient knows to call the clinic with any problems, questions or concerns. We can certainly see the patient much sooner if necessary.  The patient and plan  discussed with Glenford Peers, MD and he is in agreement with the aforementioned.  Irby Fails

## 2012-11-21 ENCOUNTER — Encounter (HOSPITAL_COMMUNITY): Payer: Self-pay | Admitting: Oncology

## 2012-11-21 ENCOUNTER — Encounter (HOSPITAL_BASED_OUTPATIENT_CLINIC_OR_DEPARTMENT_OTHER): Payer: Medicare Other

## 2012-11-21 ENCOUNTER — Other Ambulatory Visit (HOSPITAL_COMMUNITY): Payer: Self-pay | Admitting: Oncology

## 2012-11-21 ENCOUNTER — Encounter (HOSPITAL_COMMUNITY): Payer: Medicare Other | Attending: Oncology | Admitting: Oncology

## 2012-11-21 VITALS — BP 137/77 | HR 57 | Temp 98.1°F | Resp 16 | Wt 233.2 lb

## 2012-11-21 DIAGNOSIS — D509 Iron deficiency anemia, unspecified: Secondary | ICD-10-CM

## 2012-11-21 DIAGNOSIS — E119 Type 2 diabetes mellitus without complications: Secondary | ICD-10-CM

## 2012-11-21 DIAGNOSIS — C8299 Follicular lymphoma, unspecified, extranodal and solid organ sites: Secondary | ICD-10-CM | POA: Insufficient documentation

## 2012-11-21 DIAGNOSIS — I872 Venous insufficiency (chronic) (peripheral): Secondary | ICD-10-CM

## 2012-11-21 DIAGNOSIS — C8290 Follicular lymphoma, unspecified, unspecified site: Secondary | ICD-10-CM

## 2012-11-21 DIAGNOSIS — N1330 Unspecified hydronephrosis: Secondary | ICD-10-CM | POA: Insufficient documentation

## 2012-11-21 HISTORY — DX: Iron deficiency anemia, unspecified: D50.9

## 2012-11-21 LAB — CBC WITH DIFFERENTIAL/PLATELET
Basophils Relative: 1 % (ref 0–1)
Eosinophils Absolute: 0.3 10*3/uL (ref 0.0–0.7)
Lymphs Abs: 1.7 10*3/uL (ref 0.7–4.0)
MCH: 24.8 pg — ABNORMAL LOW (ref 26.0–34.0)
Neutrophils Relative %: 74 % (ref 43–77)
Platelets: 328 10*3/uL (ref 150–400)
RBC: 3.83 MIL/uL — ABNORMAL LOW (ref 3.87–5.11)

## 2012-11-21 LAB — COMPREHENSIVE METABOLIC PANEL
ALT: 11 U/L (ref 0–35)
Albumin: 3.4 g/dL — ABNORMAL LOW (ref 3.5–5.2)
Alkaline Phosphatase: 113 U/L (ref 39–117)
Potassium: 4.3 mEq/L (ref 3.5–5.1)
Sodium: 140 mEq/L (ref 135–145)
Total Protein: 6.9 g/dL (ref 6.0–8.3)

## 2012-11-21 MED ORDER — ALLOPURINOL 300 MG PO TABS: 300.0000 mg | ORAL_TABLET | Freq: Every day | ORAL | Status: DC

## 2012-11-21 MED ORDER — SODIUM CHLORIDE 0.9 % IV SOLN
1020.0000 mg | Freq: Once | INTRAVENOUS | Status: AC
  Administered 2012-11-21: 1020 mg via INTRAVENOUS
  Filled 2012-11-21: qty 34

## 2012-11-21 MED ORDER — ACYCLOVIR 400 MG PO TABS: 400.0000 mg | ORAL_TABLET | Freq: Every day | ORAL | Status: DC

## 2012-11-21 MED ORDER — HEPARIN SOD (PORK) LOCK FLUSH 100 UNIT/ML IV SOLN
INTRAVENOUS | Status: AC
  Filled 2012-11-21: qty 5

## 2012-11-21 MED ORDER — ONDANSETRON HCL 8 MG PO TABS: 8.0000 mg | ORAL_TABLET | Freq: Two times a day (BID) | ORAL | Status: DC

## 2012-11-21 MED ORDER — DEXAMETHASONE 4 MG PO TABS: 8.0000 mg | ORAL_TABLET | Freq: Two times a day (BID) | ORAL | Status: DC

## 2012-11-21 NOTE — Progress Notes (Signed)
Tolerated fereheme 1020 mg well. Port deaccessed and flushed.

## 2012-11-21 NOTE — Patient Instructions (Signed)
.  [facility] Discharge Instructions  RECOMMENDATIONS MADE BY THE CONSULTANT AND ANY TEST RESULTS WILL BE SENT TO YOUR REFERRING PHYSICIAN.  EXAM FINDINGS BY THE PHYSICIAN TODAY AND SIGNS OR SYMPTOMS TO REPORT TO CLINIC OR PRIMARY PHYSICIAN:  We will start your chemo next week. Treatment will be Tuesday and Wednesday  MEDICATIONS PRESCRIBED:  You will have several medicines sent to the drugstore Before treatment Start Allopurinol today and take 1 every day. Acyclovir 400 mg daily- you can start tomorrow Take 2 tylenol PM Tuesday morning before you leave home  Emla cream- apply to port site 1 hr before your chemo appt After treatment: Dexamethasone 4 mg tablets- take 2 tablets twice a day for 2 days after chemo--start next Thursday and do this Thursday and Friday Zofran 8 mg --take 2 times a day for 2 days after chemo (take next Thursday and Friday) You will also have lorazepam 0.5 mg that you can take every 3-4 hrs as needed for nausea    Thank you for choosing [facility] to provide your oncology and hematology care.  To afford each patient quality time with our providers, please arrive at least 15 minutes before your scheduled appointment time.  With your help, our goal is to use those 15 minutes to complete the necessary work-up to ensure our physicians have the information they need to help with your evaluation and healthcare recommendations.    Effective January 1st, 2014, we ask that you re-schedule your appointment with our physicians should you arrive 10 or more minutes late for your appointment.  We strive to give you quality time with our providers, and arriving late affects you and other patients whose appointments are after yours.    Again, thank you for choosing [facility].  Our hope is that these requests will decrease the amount of time that you wait before being seen by our physicians.        _____________________________________________________________  Should you have questions after your visit to [facility], please contact our office at [phone] between the hours of 8:30 a.m. and 5:00 p.m.  Voicemails left after 4:30 p.m. will not be returned until the following business day.  For prescription refill requests, have your pharmacy contact our office with your prescription refill request.

## 2012-11-22 LAB — HEPATITIS B SURFACE ANTIGEN: Hepatitis B Surface Ag: NEGATIVE

## 2012-11-22 MED ORDER — SULFAMETHOXAZOLE-TRIMETHOPRIM 800-160 MG PO TABS: ORAL_TABLET | ORAL | Status: DC

## 2012-11-22 NOTE — Patient Instructions (Addendum)
[facility]   CHEMOTHERAPY INSTRUCTIONS; These are the 2 chemo drugs you will receive. 1.Rituxan - you need to take 2 tylenol PM 1 hour before the Rituxan. You can take this at home. This reduces your risk of having an allergic reaction to the Rituxan. You will do this each time prior to Rituxan.  Side Effects: during infusion - itching, low blood pressure, low oxygen, bronchospasm, rash, trouble breathing - we need to know immediately if any of this happens. The first time you receive this drug, it takes a long time to infuse because we titrate the drug very slowly. With each Rituxan infusion, the likelihood of developing an infusion reaction decreases. You may also experience fever, chills, shaking chills, headaches, muscle aches, nausea, rash, and a low white blood cell count.  We need to be sure that you are drinking plenty of fluids - preferably 64oz of decaff fluids/water daily. It is best to start drinking fluids 2 days prior to treatment and for up to 4-5 days after treatment. As your tumor breaks down, it leaves behind uric acid and the extra fluid that you drink helps to flush this out of your body. You will also be on a medication called Allopurinol while taking Rituxan which help rid your body of the uric acid. It is important that you take this medication daily as prescribed. . 2. Bendamustine:  Infuses over 60 minutes on Day 1 and Day 2.  Side effects are neutropenia- lowering of white blood cell count. Thrombocytopenia- low platelets, and possibly nausea nad vomiting. Let us know if you develop  itching, rash,fever, bleeding.   Important points for patients undergoing chemotherapy Please wash your hands for at least 30 seconds using warm soapy water. Handwashing is the #1 way to prevent the spread of germs.  Stay away from sick people or people who are getting over a cold. If you develop respiratory systems such as green/yellow mucus production or  productive cough or persistent cough let us know and we will see if you need an antibiotic. It is a good idea to keep a pair of gloves on when going into grocery stores/[employer] to decrease your risk of coming into contact with germs on the carts, etc. Carry alcohol hand gel with you at all times and use it frequently if out in public.  All foods need to be cooked thoroughly. No raw foods. No medium or undercooked meats, eggs. If your food is cooked medium well, it does not need to be hot pink or saturated with bloody liquid at all. Vegetables and fruits need to be washed/rinsed under the faucet with a dish detergent before being consumed. You can eat raw fruits and vegetables unless we tell you otherwise but it would be best if you cooked them or bought frozen. Do not eat off of salad bars or hot bars unless you really trust the cleanliness of the restaurant.  If you need dental work, please let Dr. Mariel Sleet know before you go for your appointment so that we can coordinate the best possible time for you in regards to your chemo regimen. You need to also let your dentist know that you are actively taking chemo. We may need to do labs prior to your dental appointment.  We also want your bowels moving at least every other day. If this is not happening, we need to know so that we can get you on a bowel regimen to help you go.  POTENTIAL SIDE EFFECTS OF TREATMENT: Increased Susceptibility to Infection, Vomiting, Constipation, Hair Thinning, Changes in Character of Skin and Nails (brittleness, dryness,etc.), Pigment Changes (darkening of veins, nail beds, palms of hands, soles of feet, etc.), Bone Marrow Suppression and Abdominal Cramping   SELF IMAGE NEEDS AND REFERRALS MADE: We have look good feel good program available if you are interested.    EDUCATIONAL MATERIALS GIVEN AND REVIEWED: Solicitor on Rituxan and Bendamustine    SELF CARE ACTIVITIES WHILE ON  CHEMOTHERAPY: Increase your fluid intake 48 hours prior to treatment and drink at least 2 quarts per day after treatment., No alcohol intake., No aspirin or other medications unless approved by your oncologist.,  Eat foods that are light and easy to digest., Eat foods at cold or room temperature., No fried, fatty, or spicy foods immediately before or after treatment.  Have teeth cleaned professionally before starting treatment.  Keep dentures and partial plates clean., Use soft toothbrush and do not use mouthwashes that contain alcohol. Biotene is a good mouthwash that is available at most pharmacies or may be ordered by calling [phone]., Use warm salt water gargles (1 teaspoon salt per 1 quart warm water) before and after meals and at bedtime. Or you may rinse with 2 tablespoons of three -percent hydrogen peroxide mixed in eight ounces of water. and Always use sunscreen with SPF (Sun Protection Factor) of 30 or higher.   MEDICATIONS: You have been given prescriptions for the following medications: Allopurinol 100mg  po daily Acyclovir 400 mg daily Ativan 0.5 mg every 3 to 4 hours as needed for nausea or vomiting 2 tylenol PM-- Take an hour prior to Rituxan Zofran 8mg  twice a day for 2 days starting the day after chemo Dexamethasone 4mg  tablets- take 2 twice a day for 2 days starting the day after chemo Colace - this is a stool softener. Take 100mg  capsule 2-12 times a day as needed. If you have to take more than 6 capsules of Colace a day call the Cancer Center. Septra DS take 1 on Monday Wednesday and Friday- this is a preventative antibiotic   Other Medication Instructions:   SYMPTOMS TO REPORT AS SOON AS POSSIBLE AFTER TREATMENT:  FEVER GREATER THAN 100.5 F  CHILLS WITH OR WITHOUT FEVER  NAUSEA AND VOMITING THAT IS NOT CONTROLLED WITH YOUR NAUSEA MEDICATION  UNUSUAL SHORTNESS OF BREATH  UNUSUAL BRUISING OR BLEEDING  TENDERNESS IN MOUTH AND THROAT WITH OR WITHOUT PRESENCE OF  ULCERS  URINARY PROBLEMS  BOWEL PROBLEMS  UNUSUAL RASH    Wear comfortable clothing and clothing appropriate for easy access to any Portacath or PICC line. Let us know if there is anything that we can do to make your therapy better!      I have been informed and understand all of the instructions given to me and have received a copy. I have been instructed to call the clinic [phone] or my family physician as soon as possible for continued medical care, if indicated. I do not have any more questions at this time but understand that I may call the Cancer Center or the Patient Navigator at [phone] during office hours should I have questions or need assistance in obtaining follow-up care.      _________________________________________      _______________     __________ Signature of Patient or Authorized Representative        Date  Time      _________________________________________ Nurse's Signature

## 2012-11-23 ENCOUNTER — Encounter (HOSPITAL_BASED_OUTPATIENT_CLINIC_OR_DEPARTMENT_OTHER): Payer: Medicare Other

## 2012-11-23 DIAGNOSIS — C8290 Follicular lymphoma, unspecified, unspecified site: Secondary | ICD-10-CM

## 2012-11-23 DIAGNOSIS — C8299 Follicular lymphoma, unspecified, extranodal and solid organ sites: Secondary | ICD-10-CM

## 2012-11-23 NOTE — Progress Notes (Signed)
Chemo done and consent signed. Patient states she still has not gotten dexamethasone and zofran. Pharmacy called and Rx given on voicemail. (pharmacist was on break) Patient thinks she has septra at home. She will check when she gets home. Patient has allopurinol, acyclovir and lorazepam with her.

## 2012-11-24 ENCOUNTER — Telehealth (HOSPITAL_COMMUNITY): Payer: Self-pay

## 2012-11-24 NOTE — Telephone Encounter (Signed)
Spoke with patient and she verified that she had the dexamethasone and zofran.

## 2012-11-24 NOTE — Telephone Encounter (Signed)
Message copied by Evelena Leyden on Fri Nov 24, 2012  1:45 PM ------      Message from: Edythe Lynn A      Created: Thu Nov 23, 2012  3:53 PM       Fannie Knee, can you check with [place] on Friday. I did her chemo teaching and she  still has not gotten dexamethasone and zofran. Pharmacy called and Rx given on voicemail. (pharmacist was on break) Patient thinks she has septra at home. She will check when she gets home. She  has allopurinol, acyclovir and lorazepam with her. I don't know what happened with them, i escribed everything at the same time except the septra...       Make sure she has zofran, decadron, and septra ds. Thank you!       ------

## 2012-11-28 ENCOUNTER — Encounter (HOSPITAL_BASED_OUTPATIENT_CLINIC_OR_DEPARTMENT_OTHER): Payer: Medicare Other

## 2012-11-28 VITALS — BP 130/63 | HR 78 | Temp 97.6°F | Resp 18 | Wt 231.4 lb

## 2012-11-28 DIAGNOSIS — Z5111 Encounter for antineoplastic chemotherapy: Secondary | ICD-10-CM

## 2012-11-28 DIAGNOSIS — Z5112 Encounter for antineoplastic immunotherapy: Secondary | ICD-10-CM

## 2012-11-28 DIAGNOSIS — C8299 Follicular lymphoma, unspecified, extranodal and solid organ sites: Secondary | ICD-10-CM

## 2012-11-28 DIAGNOSIS — C8290 Follicular lymphoma, unspecified, unspecified site: Secondary | ICD-10-CM

## 2012-11-28 LAB — CBC WITH DIFFERENTIAL/PLATELET
Basophils Absolute: 0.1 10*3/uL (ref 0.0–0.1)
HCT: 29.6 % — ABNORMAL LOW (ref 36.0–46.0)
Hemoglobin: 9.4 g/dL — ABNORMAL LOW (ref 12.0–15.0)
Lymphocytes Relative: 13 % (ref 12–46)
Monocytes Absolute: 0.8 10*3/uL (ref 0.1–1.0)
Monocytes Relative: 8 % (ref 3–12)
Neutro Abs: 7.7 10*3/uL (ref 1.7–7.7)
WBC: 10 10*3/uL (ref 4.0–10.5)

## 2012-11-28 LAB — COMPREHENSIVE METABOLIC PANEL
AST: 18 U/L (ref 0–37)
Alkaline Phosphatase: 115 U/L (ref 39–117)
BUN: 43 mg/dL — ABNORMAL HIGH (ref 6–23)
CO2: 23 mEq/L (ref 19–32)
Chloride: 99 mEq/L (ref 96–112)
Creatinine, Ser: 3.16 mg/dL — ABNORMAL HIGH (ref 0.50–1.10)
GFR calc non Af Amer: 14 mL/min — ABNORMAL LOW (ref >90)
Total Bilirubin: 0.2 mg/dL — ABNORMAL LOW (ref 0.3–1.2)

## 2012-11-28 LAB — SEDIMENTATION RATE: Sed Rate: 35 mm/hr — ABNORMAL HIGH (ref 0–22)

## 2012-11-28 MED ORDER — SODIUM CHLORIDE 0.9 % IV SOLN
375.0000 mg/m2 | Freq: Once | INTRAVENOUS | Status: AC
  Administered 2012-11-28: 800 mg via INTRAVENOUS
  Filled 2012-11-28: qty 80

## 2012-11-28 MED ORDER — SODIUM CHLORIDE 0.9 % IJ SOLN
10.0000 mL | INTRAMUSCULAR | Status: DC | PRN
  Administered 2012-11-28: 10 mL
  Filled 2012-11-28: qty 10

## 2012-11-28 MED ORDER — SODIUM CHLORIDE 0.9 % IV SOLN
Freq: Once | INTRAVENOUS | Status: AC
  Administered 2012-11-28: 8 mg via INTRAVENOUS
  Filled 2012-11-28: qty 4

## 2012-11-28 MED ORDER — HEPARIN SOD (PORK) LOCK FLUSH 100 UNIT/ML IV SOLN
500.0000 [IU] | Freq: Once | INTRAVENOUS | Status: AC | PRN
  Administered 2012-11-28: 500 [IU]
  Filled 2012-11-28: qty 5

## 2012-11-28 MED ORDER — SODIUM CHLORIDE 0.9 % IV SOLN
90.0000 mg/m2 | Freq: Once | INTRAVENOUS | Status: AC
  Administered 2012-11-28: 200 mg via INTRAVENOUS
  Filled 2012-11-28: qty 40

## 2012-11-28 MED ORDER — SODIUM CHLORIDE 0.9 % IV SOLN
Freq: Once | INTRAVENOUS | Status: AC
  Administered 2012-11-28: 09:00:00 via INTRAVENOUS

## 2012-11-28 NOTE — Progress Notes (Signed)
Tolerated chemo well.  Port is located on left side of chest.

## 2012-11-29 ENCOUNTER — Telehealth (HOSPITAL_COMMUNITY): Payer: Self-pay | Admitting: *Deleted

## 2012-11-29 ENCOUNTER — Encounter (HOSPITAL_BASED_OUTPATIENT_CLINIC_OR_DEPARTMENT_OTHER): Payer: Medicare Other

## 2012-11-29 VITALS — BP 142/61 | HR 82 | Temp 97.0°F | Resp 20 | Wt 233.0 lb

## 2012-11-29 DIAGNOSIS — C8290 Follicular lymphoma, unspecified, unspecified site: Secondary | ICD-10-CM

## 2012-11-29 DIAGNOSIS — C8299 Follicular lymphoma, unspecified, extranodal and solid organ sites: Secondary | ICD-10-CM

## 2012-11-29 DIAGNOSIS — Z5111 Encounter for antineoplastic chemotherapy: Secondary | ICD-10-CM

## 2012-11-29 MED ORDER — HEPARIN SOD (PORK) LOCK FLUSH 100 UNIT/ML IV SOLN
500.0000 [IU] | Freq: Once | INTRAVENOUS | Status: AC | PRN
  Administered 2012-11-29: 500 [IU]
  Filled 2012-11-29: qty 5

## 2012-11-29 MED ORDER — SODIUM CHLORIDE 0.9 % IV SOLN
Freq: Once | INTRAVENOUS | Status: AC
  Administered 2012-11-29: 8 mg via INTRAVENOUS
  Filled 2012-11-29: qty 4

## 2012-11-29 MED ORDER — HEPARIN SOD (PORK) LOCK FLUSH 100 UNIT/ML IV SOLN
INTRAVENOUS | Status: AC
  Filled 2012-11-29: qty 5

## 2012-11-29 MED ORDER — SODIUM CHLORIDE 0.9 % IV SOLN
90.0000 mg/m2 | Freq: Once | INTRAVENOUS | Status: AC
  Administered 2012-11-29: 200 mg via INTRAVENOUS
  Filled 2012-11-29: qty 40

## 2012-11-29 MED ORDER — SODIUM CHLORIDE 0.9 % IV SOLN
Freq: Once | INTRAVENOUS | Status: AC
  Administered 2012-11-29: 09:00:00 via INTRAVENOUS

## 2012-11-29 MED ORDER — SODIUM CHLORIDE 0.9 % IJ SOLN
10.0000 mL | INTRAMUSCULAR | Status: DC | PRN
  Administered 2012-11-29: 10 mL
  Filled 2012-11-29: qty 10

## 2012-11-29 NOTE — Telephone Encounter (Signed)
Call made and message left at [facility] regarding patient needing a nephrology consult d/t creatinine of >3. Also let them know that she is currently on chemotherapy drugs. I left a call back #. I also contacted [facility] (Dr. Susa Griffins office) and asked that they fax their records to [facility]. The receptionist said that she would fax those records to them.

## 2012-11-29 NOTE — Progress Notes (Signed)
Tolerated infusion well. Discussed kidney function and referral to [facility] .

## 2012-11-30 ENCOUNTER — Other Ambulatory Visit (HOSPITAL_COMMUNITY): Payer: Self-pay | Admitting: *Deleted

## 2012-11-30 DIAGNOSIS — C8290 Follicular lymphoma, unspecified, unspecified site: Secondary | ICD-10-CM

## 2012-11-30 DIAGNOSIS — N1330 Unspecified hydronephrosis: Secondary | ICD-10-CM | POA: Insufficient documentation

## 2012-12-01 ENCOUNTER — Ambulatory Visit (HOSPITAL_COMMUNITY)
Admission: RE | Admit: 2012-12-01 | Discharge: 2012-12-01 | Disposition: A | Discharge status: Home / Self Care | Payer: Medicare Other | Source: Ambulatory Visit | Attending: Oncology | Admitting: Oncology

## 2012-12-01 ENCOUNTER — Encounter (HOSPITAL_BASED_OUTPATIENT_CLINIC_OR_DEPARTMENT_OTHER): Payer: Medicare Other

## 2012-12-01 DIAGNOSIS — C8299 Follicular lymphoma, unspecified, extranodal and solid organ sites: Secondary | ICD-10-CM

## 2012-12-01 DIAGNOSIS — C8290 Follicular lymphoma, unspecified, unspecified site: Secondary | ICD-10-CM

## 2012-12-01 DIAGNOSIS — N1330 Unspecified hydronephrosis: Secondary | ICD-10-CM

## 2012-12-01 DIAGNOSIS — N269 Renal sclerosis, unspecified: Secondary | ICD-10-CM | POA: Insufficient documentation

## 2012-12-01 DIAGNOSIS — N289 Disorder of kidney and ureter, unspecified: Secondary | ICD-10-CM | POA: Insufficient documentation

## 2012-12-01 LAB — PRO B NATRIURETIC PEPTIDE: Pro B Natriuretic peptide (BNP): 4827 pg/mL — ABNORMAL HIGH (ref 0–125)

## 2012-12-01 LAB — BASIC METABOLIC PANEL
BUN: 73 mg/dL — ABNORMAL HIGH (ref 6–23)
CO2: 20 mEq/L (ref 19–32)
Calcium: 9.1 mg/dL (ref 8.4–10.5)
Creatinine, Ser: 3.5 mg/dL — ABNORMAL HIGH (ref 0.50–1.10)

## 2012-12-01 NOTE — Addendum Note (Signed)
Addended by: Oda Kilts on: 12/01/2012 01:49 PM   Modules accepted: Orders

## 2012-12-01 NOTE — Progress Notes (Addendum)
Your BNP is elevated at 4,827. The normal range is 0-125.  Your Potassium is 5.4. Your kidney function BUN/creatinine is 73/3.5. These numbers continue to elevate.   Jenita Seashore PA-C wants you to increase your Lasix to 2 tablets in the morning for 5 days. Your last dose of taking your medication at this dosage will be on Wednesday morning. Starting on Thursday return to taking Lasix as you were before (1 tablet in the morning).   Return to [place] on Wednesday 5/28 @ 11:30 for repeat bloodwork. We will again check your kidneys and a complete blood count.   Make sure you return to taking your Glipizide two times a day. Before breakfast and before supper. Continue to check your blood sugars at home.   We are referring you to [facility] here at [facility] due to your BNP lab test being elevated and the swelling in your legs. You will need to take your current medications and over-the-counter medications with you to your visit. You will see Dr. Dietrich Pates with [facility] on the 1st floor of [facility] on December 15, 2012 @ 2:30. Their phone number is [phone].  Call [place] @ [phone] if you need to change an appointment or have questions about anything.   [facility] have not called me back yet regarding set you up for an appointment with a nephrologist (kidney doctor). As soon as they do, I will let you know. They may call you before calling me to set up an appointment.

## 2012-12-01 NOTE — Progress Notes (Signed)
[name] [name] presented for labwork. Labs per MD order drawn via Peripheral Line 23 gauge needle inserted in lt ac  Good blood return present. Procedure without incident.  Needle removed intact. Patient tolerated procedure well.

## 2012-12-05 ENCOUNTER — Telehealth (HOSPITAL_COMMUNITY): Payer: Self-pay | Admitting: *Deleted

## 2012-12-05 NOTE — Telephone Encounter (Signed)
Received call from [facility], requesting that patient stop Losartan in the interim until they see her. Call to patient to call me back so that I can explain this to her.

## 2012-12-05 NOTE — Telephone Encounter (Signed)
Patient instructed to stop taking her Losartan per a doctor at [facility]. Tammy took the message earlier and I called to tell the patient. She said that she wrote down my instructions and said ok to the instructions. She is due to see [place] tomorrow 5/28 for a BMET.

## 2012-12-06 ENCOUNTER — Encounter (HOSPITAL_BASED_OUTPATIENT_CLINIC_OR_DEPARTMENT_OTHER): Payer: Medicare Other

## 2012-12-06 DIAGNOSIS — C8299 Follicular lymphoma, unspecified, extranodal and solid organ sites: Secondary | ICD-10-CM

## 2012-12-06 DIAGNOSIS — N1330 Unspecified hydronephrosis: Secondary | ICD-10-CM

## 2012-12-06 DIAGNOSIS — C8290 Follicular lymphoma, unspecified, unspecified site: Secondary | ICD-10-CM

## 2012-12-06 LAB — COMPREHENSIVE METABOLIC PANEL
Albumin: 3.2 g/dL — ABNORMAL LOW (ref 3.5–5.2)
BUN: 76 mg/dL — ABNORMAL HIGH (ref 6–23)
Calcium: 8.4 mg/dL (ref 8.4–10.5)
Creatinine, Ser: 3.06 mg/dL — ABNORMAL HIGH (ref 0.50–1.10)
GFR calc Af Amer: 17 mL/min — ABNORMAL LOW (ref >90)
Glucose, Bld: 149 mg/dL — ABNORMAL HIGH (ref 70–99)
Total Protein: 6.3 g/dL (ref 6.0–8.3)

## 2012-12-06 LAB — CBC WITH DIFFERENTIAL/PLATELET
Basophils Relative: 0 % (ref 0–1)
Eosinophils Absolute: 0 10*3/uL (ref 0.0–0.7)
Eosinophils Relative: 0 % (ref 0–5)
HCT: 35.8 % — ABNORMAL LOW (ref 36.0–46.0)
Hemoglobin: 11.7 g/dL — ABNORMAL LOW (ref 12.0–15.0)
MCH: 26.1 pg (ref 26.0–34.0)
MCHC: 32.7 g/dL (ref 30.0–36.0)
MCV: 79.7 fL (ref 78.0–100.0)
Monocytes Absolute: 0.3 10*3/uL (ref 0.1–1.0)
Monocytes Relative: 2 % — ABNORMAL LOW (ref 3–12)
Neutrophils Relative %: 90 % — ABNORMAL HIGH (ref 43–77)

## 2012-12-06 NOTE — Progress Notes (Signed)
Labs drawn today for cbc/diff,cmp 

## 2012-12-12 ENCOUNTER — Inpatient Hospital Stay (HOSPITAL_COMMUNITY)
Admission: EM | Admit: 2012-12-12 | Discharge: 2012-12-15 | DRG: 872 | Disposition: A | Discharge status: Home / Self Care | Payer: Medicare Other | Attending: Internal Medicine | Admitting: Internal Medicine

## 2012-12-12 ENCOUNTER — Emergency Department (HOSPITAL_COMMUNITY): Payer: Medicare Other

## 2012-12-12 ENCOUNTER — Telehealth (HOSPITAL_COMMUNITY): Payer: Self-pay | Admitting: *Deleted

## 2012-12-12 ENCOUNTER — Encounter (HOSPITAL_COMMUNITY): Payer: Self-pay | Admitting: *Deleted

## 2012-12-12 DIAGNOSIS — Z9221 Personal history of antineoplastic chemotherapy: Secondary | ICD-10-CM

## 2012-12-12 DIAGNOSIS — I129 Hypertensive chronic kidney disease with stage 1 through stage 4 chronic kidney disease, or unspecified chronic kidney disease: Secondary | ICD-10-CM | POA: Diagnosis present

## 2012-12-12 DIAGNOSIS — I10 Essential (primary) hypertension: Secondary | ICD-10-CM

## 2012-12-12 DIAGNOSIS — N1330 Unspecified hydronephrosis: Secondary | ICD-10-CM

## 2012-12-12 DIAGNOSIS — Z79899 Other long term (current) drug therapy: Secondary | ICD-10-CM

## 2012-12-12 DIAGNOSIS — E1169 Type 2 diabetes mellitus with other specified complication: Secondary | ICD-10-CM | POA: Diagnosis present

## 2012-12-12 DIAGNOSIS — C8236 Follicular lymphoma grade IIIa, intrapelvic lymph nodes: Secondary | ICD-10-CM | POA: Diagnosis present

## 2012-12-12 DIAGNOSIS — A419 Sepsis, unspecified organism: Principal | ICD-10-CM | POA: Diagnosis present

## 2012-12-12 DIAGNOSIS — N390 Urinary tract infection, site not specified: Secondary | ICD-10-CM | POA: Diagnosis present

## 2012-12-12 DIAGNOSIS — N184 Chronic kidney disease, stage 4 (severe): Secondary | ICD-10-CM | POA: Diagnosis present

## 2012-12-12 DIAGNOSIS — R630 Anorexia: Secondary | ICD-10-CM | POA: Diagnosis present

## 2012-12-12 DIAGNOSIS — Z794 Long term (current) use of insulin: Secondary | ICD-10-CM

## 2012-12-12 DIAGNOSIS — E860 Dehydration: Secondary | ICD-10-CM | POA: Diagnosis present

## 2012-12-12 DIAGNOSIS — D509 Iron deficiency anemia, unspecified: Secondary | ICD-10-CM | POA: Diagnosis present

## 2012-12-12 DIAGNOSIS — E669 Obesity, unspecified: Secondary | ICD-10-CM | POA: Diagnosis present

## 2012-12-12 DIAGNOSIS — C8290 Follicular lymphoma, unspecified, unspecified site: Secondary | ICD-10-CM

## 2012-12-12 DIAGNOSIS — E785 Hyperlipidemia, unspecified: Secondary | ICD-10-CM | POA: Diagnosis present

## 2012-12-12 DIAGNOSIS — R410 Disorientation, unspecified: Secondary | ICD-10-CM | POA: Diagnosis present

## 2012-12-12 DIAGNOSIS — Z6841 Body Mass Index (BMI) 40.0 and over, adult: Secondary | ICD-10-CM

## 2012-12-12 DIAGNOSIS — N179 Acute kidney failure, unspecified: Secondary | ICD-10-CM | POA: Diagnosis present

## 2012-12-12 DIAGNOSIS — R509 Fever, unspecified: Secondary | ICD-10-CM

## 2012-12-12 DIAGNOSIS — C8299 Follicular lymphoma, unspecified, extranodal and solid organ sites: Secondary | ICD-10-CM | POA: Diagnosis present

## 2012-12-12 DIAGNOSIS — E119 Type 2 diabetes mellitus without complications: Secondary | ICD-10-CM | POA: Diagnosis present

## 2012-12-12 MED ORDER — ONDANSETRON HCL 4 MG/2ML IJ SOLN
4.0000 mg | Freq: Once | INTRAMUSCULAR | Status: AC
  Administered 2012-12-13: 4 mg via INTRAVENOUS
  Filled 2012-12-12: qty 2

## 2012-12-12 MED ORDER — SODIUM CHLORIDE 0.9 % IV BOLUS (SEPSIS)
700.0000 mL | Freq: Once | INTRAVENOUS | Status: AC
  Administered 2012-12-13: 700 mL via INTRAVENOUS

## 2012-12-12 NOTE — ED Provider Notes (Addendum)
History     This chart was scribed for Ward Givens, MD, MD by Smitty Pluck, ED Scribe. The patient was seen in room APA14/APA14 and the patient's care was started at 10:43PM.   CSN: [id number]  Arrival date & time 12/12/12  2117     Chief Complaint  Patient presents with  . Nausea  . Weakness    Chemotherapy pt, 2 weeks since treatment    The history is provided by the patient, medical records and the spouse. No language interpreter was used.    Level 5 caveat due to altered mental status  HPI Comments: [name] [name] is a 72 y.o. female with hx of HTN, DM and non-hodgkin's lymphoma who presents to the Emergency Department BIB EMS complaining of nausea onset  2 days ago. Husband reports that she had chemotherapy 2 weeks ago. She did well the first week but the second week has c/o nausea and has not been able to eat or drink.  Husband states the chills started 2 days ago. Husband denies checking her temperature at home. He states pt has productive cough. He states pt has had decreased appetite. She is scheduled for chemotherapy in 10 days. He states that pt has been confused and was about to take the wrong medication today. PT cannot tell me why she is here.   Dr Lysbeth Galas is PCP Oncologist is Dr Mickel Fuchs  Past Medical History  Diagnosis Date  . Hypertension   . Diabetes   . Hyperlipidemia   . 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 Iron deficiency anemia, unspecified 11/21/2012    At time of presentation.  Feraheme 1020 mg on 11/21/12.    Past Surgical History  Procedure Laterality Date  . Cataract extraction    . Cystoscopy w/ ureteral stent placement Right 11/03/2012    Procedure: CYSTOSCOPY WITH RETROGRADE PYELOGRAM/URETERAL STENT PLACEMENT;  Surgeon: Anner Crete, MD;  Location: [facility];  Service: Urology;  Laterality: Right;  . Portacath placement Left 11/03/2012    Procedure: INSERTION PORT-A-CATH;  Surgeon: Marlane Hatcher, MD;  Location: [facility];  Service: General;  Laterality:  Left;  Insertion Port-A-Cath Left Subclavian    Family History  Problem Relation Age of Onset  . Cancer Father   . Cancer Brother     History  Substance Use Topics  . Smoking status: Never Smoker   . Smokeless tobacco: Never Used  . Alcohol Use: No   lives at home Lives with spouse  OB History   Grav Para Term Preterm Abortions TAB SAB Ect Mult Living                  Review of Systems  Unable to perform ROS: Other    Allergies  Review of patient's allergies indicates no known allergies.  Home Medications   Current Outpatient Rx  Name  Route  Sig  Dispense  Refill  . acyclovir (ZOVIRAX) 400 MG tablet   Oral   Take 1 tablet (400 mg total) by mouth daily.   30 tablet   3   . allopurinol (ZYLOPRIM) 300 MG tablet   Oral   Take 1 tablet (300 mg total) by mouth daily.   30 tablet   3   . atorvastatin (LIPITOR) 80 MG tablet   Oral   Take 80 mg by mouth every morning.         Marland Kitchen dexamethasone (DECADRON) 4 MG tablet   Oral   Take 2 tablets (8 mg total) by mouth  2 (two) times daily with a meal. Take daily starting the day after chemotherapy for 2 days. Take with food.   30 tablet   1   . furosemide (LASIX) 20 MG tablet   Oral   Take 20 mg by mouth daily.         Marland Kitchen glipiZIDE (GLUCOTROL) 10 MG tablet   Oral   Take 10 mg by mouth 2 (two) times daily before a meal.         . insulin detemir (LEVEMIR FLEXPEN) 100 UNIT/ML injection   Subcutaneous   Inject 35 Units into the skin daily.          Marland Kitchen LORazepam (ATIVAN) 0.5 MG tablet   Oral   Take 0.5 mg by mouth every 4 (four) hours as needed (nausea).          . ondansetron (ZOFRAN) 8 MG tablet   Oral   Take 1 tablet (8 mg total) by mouth 2 (two) times daily. Take two times a day starting the day after chemo for 2 days. Then take two times a day as needed for nausea or vomiting.   30 tablet   1   . sulfamethoxazole-trimethoprim (BACTRIM DS,SEPTRA DS) 800-160 MG per tablet      Take one tablet on  Monday-Wednesday-and Friday for 6 months   30 tablet   2   . acetaminophen (TYLENOL ARTHRITIS PAIN) 650 MG CR tablet   Oral   Take 650 mg by mouth every 8 (eight) hours as needed for pain.         Marland Kitchen docusate sodium (COLACE) 100 MG capsule   Oral   Take 100-200 mg by mouth daily.         Marland Kitchen losartan (COZAAR) 100 MG tablet   Oral   Take 100 mg by mouth every morning.         . Multiple Vitamin (MULTIVITAMIN WITH MINERALS) TABS   Oral   Take 1 tablet by mouth daily.           BP 141/46  Pulse 88  Temp(Src) 100.1 F (37.8 C) (Oral)  Resp 20  Ht 5\' 2"  (1.575 m)  Wt 230 lb (104.327 kg)  BMI 42.06 kg/m2  SpO2 98%  Vital signs normal    Physical Exam  Nursing note and vitals reviewed. Constitutional: She appears well-developed and well-nourished. She is sleeping.  Non-toxic appearance. She does not appear ill. No distress.  HENT:  Head: Normocephalic and atraumatic.  Right Ear: External ear normal.  Left Ear: External ear normal.  Nose: Nose normal. No mucosal edema or rhinorrhea.  Mouth/Throat: Oropharynx is clear and moist and mucous membranes are normal. No dental abscesses or edematous.  Eyes: Conjunctivae and EOM are normal. Pupils are equal, round, and reactive to light.  Neck: Normal range of motion and full passive range of motion without pain. Neck supple.  Cardiovascular: Normal rate, regular rhythm and normal heart sounds.  Exam reveals no gallop and no friction rub.   No murmur heard. Pulmonary/Chest: Effort normal and breath sounds normal. No respiratory distress. She has no wheezes. She has no rhonchi. She has no rales. She exhibits no tenderness and no crepitus.  Abdominal: Soft. Normal appearance and bowel sounds are normal. She exhibits no distension. There is no tenderness. There is no rebound and no guarding.  Musculoskeletal: Normal range of motion. She exhibits no edema and no tenderness.  Moves all extremities well.   Neurological: She has  normal strength. No  cranial nerve deficit.  Skin: Skin is warm, dry and intact. No rash noted. No erythema. No pallor.  Psychiatric: Her affect is blunt. Her speech is delayed. She is slowed. Cognition and memory are impaired.    ED Course  Procedures (including critical care time)  Medications  ceFEPIme (MAXIPIME) 2 g in dextrose 5 % 50 mL IVPB (not administered)  sodium chloride 0.9 % bolus 700 mL (700 mLs Intravenous New Bag/Given 12/13/12 0011)  ondansetron (ZOFRAN) injection 4 mg (4 mg Intravenous Given 12/13/12 0011)    DIAGNOSTIC STUDIES: Oxygen Saturation is 98% on room air, normal by my interpretation.    COORDINATION OF CARE: 10:54 PM Discussed ED treatment with pt's husband and he agrees.   Patient started on IV antibiotics for probable UTI. 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  01:15 Dr Orvan Falconer, admit to med-surg, team 2  Results for orders placed during the hospital encounter of 12/12/12  CBC WITH DIFFERENTIAL      Result Value Range   WBC 8.7  4.0 - 10.5 K/uL   RBC 3.72 (*) 3.87 - 5.11 MIL/uL   Hemoglobin 10.0 (*) 12.0 - 15.0 g/dL   HCT 16.1 (*) 09.6 - 04.5 %   MCV 82.8  78.0 - 100.0 fL   MCH 26.9  26.0 - 34.0 pg   MCHC 32.5  30.0 - 36.0 g/dL   RDW 40.9 (*) 81.1 - 91.4 %   Platelets 191  150 - 400 K/uL   Neutrophils Relative % 92 (*) 43 - 77 %   Neutro Abs 8.0 (*) 1.7 - 7.7 K/uL   Lymphocytes Relative 5 (*) 12 - 46 %   Lymphs Abs 0.4 (*) 0.7 - 4.0 K/uL   Monocytes Relative 3  3 - 12 %   Monocytes Absolute 0.3  0.1 - 1.0 K/uL   Eosinophils Relative 0  0 - 5 %   Eosinophils Absolute 0.0  0.0 - 0.7 K/uL   Basophils Relative 0  0 - 1 %   Basophils Absolute 0.0  0.0 - 0.1 K/uL  COMPREHENSIVE METABOLIC PANEL      Result Value Range   Sodium 131 (*) 135 - 145 mEq/L   Potassium 4.6  3.5 - 5.1 mEq/L   Chloride 98  96 - 112 mEq/L   CO2 20  19 - 32 mEq/L   Glucose, Bld 208 (*) 70 - 99 mg/dL   BUN 33 (*)  6 - 23 mg/dL   Creatinine, Ser 7.82 (*) 0.50 - 1.10 mg/dL   Calcium 8.7  8.4 - 95.6 mg/dL   Total Protein 6.0  6.0 - 8.3 g/dL   Albumin 2.6 (*) 3.5 - 5.2 g/dL   AST 22  0 - 37 U/L   ALT 21  0 - 35 U/L   Alkaline Phosphatase 75  39 - 117 U/L   Total Bilirubin 0.2 (*) 0.3 - 1.2 mg/dL   GFR calc non Af Amer 15 (*) >90 mL/min   GFR calc Af Amer 18 (*) >90 mL/min  URINALYSIS, ROUTINE W REFLEX MICROSCOPIC      Result Value Range   Color, Urine YELLOW  YELLOW   APPearance HAZY (*) CLEAR   Specific Gravity, Urine 1.015  1.005 - 1.030   pH 6.5  5.0 - 8.0   Glucose, UA 100 (*) NEGATIVE mg/dL   Hgb urine dipstick SMALL (*) NEGATIVE   Bilirubin Urine NEGATIVE  NEGATIVE   Ketones, ur  NEGATIVE  NEGATIVE mg/dL   Protein, ur 30 (*) NEGATIVE mg/dL   Urobilinogen, UA 0.2  0.0 - 1.0 mg/dL   Nitrite NEGATIVE  NEGATIVE   Leukocytes, UA SMALL (*) NEGATIVE  URINE MICROSCOPIC-ADD ON      Result Value Range   Squamous Epithelial / LPF RARE  RARE   WBC, UA 21-50  <3 WBC/hpf   Bacteria, UA FEW (*) RARE   Laboratory interpretation all normal except poss uti, anemia, hyponatremia, renal insufficiency    Dg Chest Portable 1 View  12/12/2012   *RADIOLOGY REPORT*  Clinical Data: Nausea and weakness.  PORTABLE CHEST - 1 VIEW  Comparison: Chest x-ray 11/03/2012.  Findings: Left subclavian single lumen power Port-A-Cath with tip terminating in the mid to distal superior vena cava.  Lung volumes are within normal limits.  No acute consolidative airspace disease. No pleural effusions.  No evidence of pulmonary edema.  Heart size is within normal limits. The patient is rotated to the left on today's exam, resulting in distortion of the mediastinal contours and reduced diagnostic sensitivity and specificity for mediastinal pathology.  Atherosclerosis in the thoracic aorta.  IMPRESSION: 1.  No radiographic evidence of acute cardiopulmonary disease. 2.  Atherosclerosis.   Original Report Authenticated By: Trudie Reed,  M.D.     US Renal  12/01/2012  Impression:  Atrophic right kidney with unremarkable left kidney.  No evidence for hydronephrosis.   Original Report Authenticated By: Kennith Center, M.D.      1. Fever   2. UTI (lower urinary tract infection)   3. Confusion     Plan admission   Devoria Albe, MD, FACEP   MDM    I personally performed the services described in this documentation, which was scribed in my presence. The recorded information has been reviewed and considered.  Devoria Albe, MD, FACEP    Ward Givens, MD 12/13/12 1610  Ward Givens, MD 12/13/12 [phone]

## 2012-12-12 NOTE — ED Notes (Signed)
Pt is receiving chemo therapy for cancer (pt does not know where just says it is in her female parts), pt had treatment 2 weeks ago and has had increasing nausea and weakness since then, pt was unable to ambulate for EMS, EMS gave bolus NS, pt able to respond now.

## 2012-12-12 NOTE — Telephone Encounter (Signed)
Patients husband called to report that she is very weak, not eating and has not drank a lot today. She is nauseated. Has been taking lorazepam. I encouraged him to give her zofran now and then start to push fluids in 30-45 min if nausea subsides. He knows that if she is not able to drink this afternoon to take her to ED. If she does ok tonight he will bring her tomorrow for labs and poss fluids. (cbc diff and bmet?)

## 2012-12-13 ENCOUNTER — Ambulatory Visit (HOSPITAL_COMMUNITY): Payer: Medicare Other

## 2012-12-13 ENCOUNTER — Encounter (HOSPITAL_COMMUNITY): Payer: Self-pay

## 2012-12-13 ENCOUNTER — Other Ambulatory Visit (HOSPITAL_COMMUNITY): Payer: Self-pay | Admitting: Oncology

## 2012-12-13 ENCOUNTER — Other Ambulatory Visit (HOSPITAL_COMMUNITY): Payer: Medicare Other

## 2012-12-13 DIAGNOSIS — C8299 Follicular lymphoma, unspecified, extranodal and solid organ sites: Secondary | ICD-10-CM

## 2012-12-13 DIAGNOSIS — R5381 Other malaise: Secondary | ICD-10-CM

## 2012-12-13 DIAGNOSIS — N390 Urinary tract infection, site not specified: Secondary | ICD-10-CM | POA: Diagnosis present

## 2012-12-13 DIAGNOSIS — R410 Disorientation, unspecified: Secondary | ICD-10-CM | POA: Diagnosis present

## 2012-12-13 DIAGNOSIS — E1169 Type 2 diabetes mellitus with other specified complication: Secondary | ICD-10-CM | POA: Diagnosis present

## 2012-12-13 DIAGNOSIS — N289 Disorder of kidney and ureter, unspecified: Secondary | ICD-10-CM

## 2012-12-13 DIAGNOSIS — E669 Obesity, unspecified: Secondary | ICD-10-CM

## 2012-12-13 DIAGNOSIS — I10 Essential (primary) hypertension: Secondary | ICD-10-CM

## 2012-12-13 DIAGNOSIS — F29 Unspecified psychosis not due to a substance or known physiological condition: Secondary | ICD-10-CM

## 2012-12-13 DIAGNOSIS — R4182 Altered mental status, unspecified: Secondary | ICD-10-CM

## 2012-12-13 DIAGNOSIS — N189 Chronic kidney disease, unspecified: Secondary | ICD-10-CM

## 2012-12-13 DIAGNOSIS — A419 Sepsis, unspecified organism: Secondary | ICD-10-CM

## 2012-12-13 DIAGNOSIS — N184 Chronic kidney disease, stage 4 (severe): Secondary | ICD-10-CM

## 2012-12-13 DIAGNOSIS — R509 Fever, unspecified: Secondary | ICD-10-CM

## 2012-12-13 DIAGNOSIS — E119 Type 2 diabetes mellitus without complications: Secondary | ICD-10-CM | POA: Diagnosis present

## 2012-12-13 LAB — COMPREHENSIVE METABOLIC PANEL
ALT: 20 U/L (ref 0–35)
AST: 22 U/L (ref 0–37)
AST: 23 U/L (ref 0–37)
BUN: 33 mg/dL — ABNORMAL HIGH (ref 6–23)
CO2: 20 mEq/L (ref 19–32)
CO2: 21 mEq/L (ref 19–32)
Calcium: 8.7 mg/dL (ref 8.4–10.5)
Calcium: 8.3 mg/dL — ABNORMAL LOW (ref 8.4–10.5)
Chloride: 98 mEq/L (ref 96–112)
Chloride: 102 mEq/L (ref 96–112)
Creatinine, Ser: 2.89 mg/dL — ABNORMAL HIGH (ref 0.50–1.10)
Creatinine, Ser: 2.83 mg/dL — ABNORMAL HIGH (ref 0.50–1.10)
GFR calc Af Amer: 18 mL/min — ABNORMAL LOW (ref >90)
GFR calc Af Amer: 18 mL/min — ABNORMAL LOW (ref >90)
GFR calc non Af Amer: 15 mL/min — ABNORMAL LOW (ref >90)
GFR calc non Af Amer: 16 mL/min — ABNORMAL LOW (ref >90)
Glucose, Bld: 161 mg/dL — ABNORMAL HIGH (ref 70–99)
Total Bilirubin: 0.2 mg/dL — ABNORMAL LOW (ref 0.3–1.2)
Total Bilirubin: 0.3 mg/dL (ref 0.3–1.2)

## 2012-12-13 LAB — CBC WITH DIFFERENTIAL/PLATELET
Basophils Absolute: 0 10*3/uL (ref 0.0–0.1)
Eosinophils Relative: 0 % (ref 0–5)
HCT: 30.8 % — ABNORMAL LOW (ref 36.0–46.0)
Lymphocytes Relative: 5 % — ABNORMAL LOW (ref 12–46)
Lymphs Abs: 0.4 10*3/uL — ABNORMAL LOW (ref 0.7–4.0)
MCV: 82.8 fL (ref 78.0–100.0)
Monocytes Absolute: 0.3 10*3/uL (ref 0.1–1.0)
Neutro Abs: 8 10*3/uL — ABNORMAL HIGH (ref 1.7–7.7)
RBC: 3.72 MIL/uL — ABNORMAL LOW (ref 3.87–5.11)
WBC: 8.7 10*3/uL (ref 4.0–10.5)

## 2012-12-13 LAB — GLUCOSE, CAPILLARY
Glucose-Capillary: 137 mg/dL — ABNORMAL HIGH (ref 70–99)
Glucose-Capillary: 118 mg/dL — ABNORMAL HIGH (ref 70–99)
Glucose-Capillary: 159 mg/dL — ABNORMAL HIGH (ref 70–99)
Glucose-Capillary: 126 mg/dL — ABNORMAL HIGH (ref 70–99)

## 2012-12-13 LAB — CBC
HCT: 29 % — ABNORMAL LOW (ref 36.0–46.0)
Hemoglobin: 9.2 g/dL — ABNORMAL LOW (ref 12.0–15.0)
MCH: 26.1 pg (ref 26.0–34.0)
MCV: 82.2 fL (ref 78.0–100.0)
RBC: 3.53 MIL/uL — ABNORMAL LOW (ref 3.87–5.11)
WBC: 7.4 10*3/uL (ref 4.0–10.5)

## 2012-12-13 LAB — URINALYSIS, ROUTINE W REFLEX MICROSCOPIC
Nitrite: NEGATIVE
Protein, ur: 30 mg/dL — AB
Specific Gravity, Urine: 1.015 (ref 1.005–1.030)
Urobilinogen, UA: 0.2 mg/dL (ref 0.0–1.0)

## 2012-12-13 LAB — PROTIME-INR
INR: 1.18 (ref 0.00–1.49)
Prothrombin Time: 14.8 seconds (ref 11.6–15.2)

## 2012-12-13 LAB — URINE MICROSCOPIC-ADD ON

## 2012-12-13 MED ORDER — SODIUM CHLORIDE 0.9 % IJ SOLN
3.0000 mL | Freq: Two times a day (BID) | INTRAMUSCULAR | Status: DC
  Administered 2012-12-13 – 2012-12-14 (×3): 3 mL via INTRAVENOUS

## 2012-12-13 MED ORDER — INSULIN DETEMIR 100 UNIT/ML ~~LOC~~ SOLN: 20.0000 [IU] | Freq: Every day | SUBCUTANEOUS | Status: DC

## 2012-12-13 MED ORDER — VANCOMYCIN HCL IN DEXTROSE 1-5 GM/200ML-% IV SOLN
1000.0000 mg | INTRAVENOUS | Status: DC
  Administered 2012-12-14 – 2012-12-15 (×2): 1000 mg via INTRAVENOUS
  Filled 2012-12-13 (×2): qty 200

## 2012-12-13 MED ORDER — FLEET ENEMA 7-19 GM/118ML RE ENEM: 1.0000 | ENEMA | Freq: Once | RECTAL | Status: AC | PRN

## 2012-12-13 MED ORDER — ENOXAPARIN SODIUM 30 MG/0.3ML ~~LOC~~ SOLN
30.0000 mg | SUBCUTANEOUS | Status: DC
  Administered 2012-12-13 – 2012-12-15 (×3): 30 mg via SUBCUTANEOUS
  Filled 2012-12-13 (×3): qty 0.3

## 2012-12-13 MED ORDER — INSULIN DETEMIR 100 UNIT/ML ~~LOC~~ SOLN
10.0000 [IU] | Freq: Every day | SUBCUTANEOUS | Status: DC
  Administered 2012-12-13 – 2012-12-15 (×3): 10 [IU] via SUBCUTANEOUS
  Filled 2012-12-13 (×3): qty 0.1

## 2012-12-13 MED ORDER — NYSTATIN 100000 UNIT/GM EX POWD
Freq: Three times a day (TID) | CUTANEOUS | Status: DC
  Administered 2012-12-13 – 2012-12-15 (×7): via TOPICAL
  Filled 2012-12-13: qty 15

## 2012-12-13 MED ORDER — INSULIN ASPART 100 UNIT/ML ~~LOC~~ SOLN
0.0000 [IU] | Freq: Three times a day (TID) | SUBCUTANEOUS | Status: DC
  Administered 2012-12-13 (×2): 4 [IU] via SUBCUTANEOUS
  Administered 2012-12-14: 3 [IU] via SUBCUTANEOUS
  Administered 2012-12-14: 4 [IU] via SUBCUTANEOUS
  Administered 2012-12-15: 3 [IU] via SUBCUTANEOUS

## 2012-12-13 MED ORDER — DOCUSATE SODIUM 100 MG PO CAPS
100.0000 mg | ORAL_CAPSULE | Freq: Every day | ORAL | Status: DC
  Administered 2012-12-13 – 2012-12-14 (×2): 100 mg via ORAL
  Filled 2012-12-13 (×2): qty 1
  Filled 2012-12-13: qty 2

## 2012-12-13 MED ORDER — DEXTROSE 5 % IV SOLN
1.0000 g | INTRAVENOUS | Status: DC
  Administered 2012-12-13 – 2012-12-14 (×2): 1 g via INTRAVENOUS
  Filled 2012-12-13 (×2): qty 1

## 2012-12-13 MED ORDER — SODIUM CHLORIDE 0.9 % IV SOLN
INTRAVENOUS | Status: DC
  Administered 2012-12-13 – 2012-12-15 (×5): via INTRAVENOUS

## 2012-12-13 MED ORDER — BISACODYL 10 MG RE SUPP: 10.0000 mg | Freq: Every day | RECTAL | Status: DC | PRN

## 2012-12-13 MED ORDER — DEXTROSE 5 % IV SOLN
2.0000 g | Freq: Once | INTRAVENOUS | Status: AC
  Administered 2012-12-13: 2 g via INTRAVENOUS
  Filled 2012-12-13: qty 2

## 2012-12-13 MED ORDER — ATORVASTATIN CALCIUM 40 MG PO TABS
80.0000 mg | ORAL_TABLET | Freq: Every morning | ORAL | Status: DC
  Administered 2012-12-13 – 2012-12-15 (×3): 80 mg via ORAL
  Filled 2012-12-13 (×3): qty 2

## 2012-12-13 MED ORDER — SULFAMETHOXAZOLE-TRIMETHOPRIM 800-160 MG PO TABS
1.0000 | ORAL_TABLET | ORAL | Status: DC
  Administered 2012-12-13 – 2012-12-15 (×2): 1 via ORAL
  Filled 2012-12-13 (×2): qty 1

## 2012-12-13 MED ORDER — ALLOPURINOL 300 MG PO TABS
300.0000 mg | ORAL_TABLET | Freq: Every day | ORAL | Status: DC
  Administered 2012-12-13 – 2012-12-15 (×3): 300 mg via ORAL
  Filled 2012-12-13 (×3): qty 1

## 2012-12-13 MED ORDER — ONDANSETRON HCL 4 MG PO TABS: 4.0000 mg | ORAL_TABLET | Freq: Four times a day (QID) | ORAL | Status: DC | PRN

## 2012-12-13 MED ORDER — SENNOSIDES-DOCUSATE SODIUM 8.6-50 MG PO TABS
1.0000 | ORAL_TABLET | Freq: Every evening | ORAL | Status: DC | PRN
  Administered 2012-12-15: 1 via ORAL
  Filled 2012-12-13: qty 1

## 2012-12-13 MED ORDER — VANCOMYCIN HCL IN DEXTROSE 1-5 GM/200ML-% IV SOLN
INTRAVENOUS | Status: AC
  Filled 2012-12-13: qty 400

## 2012-12-13 MED ORDER — LOSARTAN POTASSIUM 50 MG PO TABS
100.0000 mg | ORAL_TABLET | Freq: Every morning | ORAL | Status: DC
  Administered 2012-12-13 – 2012-12-15 (×3): 100 mg via ORAL
  Filled 2012-12-13 (×3): qty 2

## 2012-12-13 MED ORDER — VANCOMYCIN HCL 1000 MG IV SOLR
2000.0000 mg | Freq: Once | INTRAVENOUS | Status: AC
  Administered 2012-12-13: 2000 mg via INTRAVENOUS
  Filled 2012-12-13: qty 2000

## 2012-12-13 MED ORDER — INSULIN ASPART 100 UNIT/ML ~~LOC~~ SOLN: 0.0000 [IU] | Freq: Every day | SUBCUTANEOUS | Status: DC

## 2012-12-13 MED ORDER — ONDANSETRON HCL 4 MG/2ML IJ SOLN: 4.0000 mg | Freq: Four times a day (QID) | INTRAMUSCULAR | Status: DC | PRN

## 2012-12-13 MED ORDER — ACETAMINOPHEN 325 MG PO TABS
650.0000 mg | ORAL_TABLET | ORAL | Status: DC | PRN
  Administered 2012-12-13 (×2): 650 mg via ORAL
  Filled 2012-12-13 (×3): qty 2

## 2012-12-13 MED ORDER — SODIUM CHLORIDE 0.9 % IV SOLN
INTRAVENOUS | Status: DC
  Administered 2012-12-13: 03:00:00 via INTRAVENOUS

## 2012-12-13 MED ORDER — ACYCLOVIR 800 MG PO TABS
400.0000 mg | ORAL_TABLET | Freq: Every day | ORAL | Status: DC
  Administered 2012-12-13 – 2012-12-15 (×3): 400 mg via ORAL
  Filled 2012-12-13: qty 2
  Filled 2012-12-13 (×3): qty 1

## 2012-12-13 NOTE — Progress Notes (Signed)
  [name] [name]:[id number] DOB: Dec 28, 1940 DOA: 12/12/2012 PCP: [name] Hector, MD   Subjective: This lady, who has non-Hodgkin's lymphoma and is being treated with chemotherapy, came in yesterday with what appears to be sepsis from UTI. She is on intravenous antibiotics. Blood and urine cultures have been taken.           Physical Exam: Blood pressure 114/41, pulse 92, temperature 99 F (37.2 C), temperature source Oral, resp. rate 25, height 5\' 5"  (1.651 m), weight 104.327 kg (230 lb), SpO2 93.00%. She looks systemically well, appears to be slightly confused even now but by her own admission feels better. Heart sounds are present and in sinus rhythm. There is no gallop rhythm. There are no murmurs. Lung fields are clear. Abdomen is soft and nontender. She is obese. She is alert but not quite orientated in time. There are no focal neurological signs.   Investigations:     Basic Metabolic Panel:  Recent Labs  14/78/29 2310 12/13/12 0456  NA 131* 133*  K 4.6 4.5  CL 98 102  CO2 20 21  GLUCOSE 208* 161*  BUN 33* 32*  CREATININE 2.89* 2.83*  CALCIUM 8.7 8.3*  MG  --  1.5   Liver Function Tests:  Recent Labs  12/12/12 2310 12/13/12 0456  AST 22 23  ALT 21 20  ALKPHOS 75 67  BILITOT 0.2* 0.3  PROT 6.0 5.4*  ALBUMIN 2.6* 2.4*     CBC:  Recent Labs  12/12/12 2310 12/13/12 0456  WBC 8.7 7.4  NEUTROABS 8.0*  --   HGB 10.0* 9.2*  HCT 30.8* 29.0*  MCV 82.8 82.2  PLT 191 166    Dg Chest Portable 1 View  12/12/2012   *RADIOLOGY REPORT*  Clinical Data: Nausea and weakness.  PORTABLE CHEST - 1 VIEW  Comparison: Chest x-ray 11/03/2012.  Findings: Left subclavian single lumen power Port-A-Cath with tip terminating in the mid to distal superior vena cava.  Lung volumes are within normal limits.  No acute consolidative airspace disease. No pleural effusions.  No evidence of pulmonary edema.  Heart size is within normal limits. The patient is rotated to the  left on today's exam, resulting in distortion of the mediastinal contours and reduced diagnostic sensitivity and specificity for mediastinal pathology.  Atherosclerosis in the thoracic aorta.  IMPRESSION: 1.  No radiographic evidence of acute cardiopulmonary disease. 2.  Atherosclerosis.   Original Report Authenticated By: Trudie Reed, M.D.      Medications: I have reviewed the patient's current medications.  Impression: 1. Sepsis secondary to UTI. 2. Acute on chronic kidney disease, improving. 3. Non-Hodgkin's lymphoma, had chemotherapy approximately 2 weeks ago. 4. Type 2 diabetes mellitus. 5. Hypertension. 6. Obesity.     Plan: 1. Continue with intravenous vancomycin and Maxipime. 2. Start diabetic diet and sliding scale of insulin. 3. Oncology consultation. 4. Patient can move to the medical floor.  Consultants:  Oncology.   Procedures:  None.   Antibiotics:  Vancomycin IV started 12/13/2012.   Maxipime IV started 12/13/2012.                   Code Status: Full code.  Family Communication: Discussed plan with patient at the bedside.   Disposition Plan: Home in medically stable.  Time spent: 20 minutes.   LOS: 1 day   Wilson Singer Pager [phone]  12/13/2012, 7:41 AM

## 2012-12-13 NOTE — Care Management Note (Signed)
    Page 1 of 1   12/15/2012     11:55:38 AM   CARE MANAGEMENT NOTE 12/15/2012  Patient:  [name], [name]   Account Number:  [id number]  Date Initiated:  12/13/2012  Documentation initiated by:  Anibal Henderson  Subjective/Objective Assessment:   Admitted from home. Lives with spouse.  Admitted with possible sepsis, and has a Hx of . Pt is confused today, and family has left.  Will see again tomorrow     Action/Plan:   Anticipated DC Date:  12/15/2012   Anticipated DC Plan:  HOME/SELF CARE         Choice offered to / List presented to:             Status of service:  Completed, signed off Medicare Important Message given?   (If response is "NO", the following Medicare IM given date fields will be blank) Date Medicare IM given:   Date Additional Medicare IM given:    Discharge Disposition:  HOME/SELF CARE  Per UR Regulation:  Reviewed for med. necessity/level of care/duration of stay  If discussed at Long Length of Stay Meetings, dates discussed:    Comments:  12/15/12 [name] Holms RN [facility] CM DC. Active with [facility]. [facility] notified of DC.  12/14/12 1530 Anibal Henderson RN Spoke with pt. She if much more alert today and states she is improving and plans to return home with spouse, hopefully tomorrow. She does not feel she will need HH, unless there is something specific at D/C that is needed per MD. Will follow. 12/13/12 1400 Anibal Henderson RN/CM

## 2012-12-13 NOTE — Consult Note (Signed)
Note [phone]

## 2012-12-13 NOTE — Progress Notes (Addendum)
Patient has a fever of 104.0.  Dr. Orvan Falconer notified.  Orders given for tylenol and antibiotics.  Tylenol given.  Dr. Orvan Falconer also requested patient be transferred to the ICU.  Repot given to [place].  Patient transferred to ICU bed 4.

## 2012-12-13 NOTE — Progress Notes (Signed)
Post void residual 50cc today

## 2012-12-13 NOTE — H&P (Signed)
[facility] History and Physical  [name] [name]  HYQ:[id number]  DOB: 06/06/1941   DOA: 12/13/2012   PCP:   [name], MD  Oncologist: Dr. Mariel Sleet  History is taken from her husband since patient is still weak and confused to contribute  Chief Complaint:  Progressive weakness past few days . HPI: [name] [name] is a 72 y.o. female.   Totally obese Caucasian lady with a history of diabetes, and follicular lymphoma receiving chemotherapy for same. Last chemotherapy 2 weeks ago. For the past few days has been having anorexia fever and chills and progressive weakness. So weak today that she could not walk and EMS was called.  In the emergency room patient was febrile,dehydrated and weak and confused; urine is suggestive of infection She does have a Port-A-Cath in place.  Rewiew of Systems:   All systems negative except as marked bold or noted in the HPI;  Constitutional:    malaise, fever and chills. ;  Eyes:   eye pain, redness and discharge. ;  ENMT:   ear pain, hoarseness, nasal congestion, sinus pressure and sore throat. ;  Cardiovascular:    chest pain, palpitations, diaphoresis, dyspnea and peripheral edema.  Respiratory:   cough, hemoptysis, wheezing and stridor. ;  Gastrointestinal:  nausea, vomiting, diarrhea, constipation, abdominal pain, melena, blood in stool, hematemesis, jaundice and rectal bleeding. unusual weight loss..   Genitourinary:    frequency, dysuria, incontinence,flank pain and hematuria; Musculoskeletal:   back pain and neck pain.  swelling and trauma.;  Skin: .  pruritus, rash, abrasions, bruising and skin lesion.; ulcerations Neuro:    headache, lightheadedness and neck stiffness.  weakness, altered level of consciousness, altered mental status, extremity weakness, burning feet, involuntary movement, seizure and syncope.  Psych:    anxiety, depression, insomnia, tearfulness, panic attacks, hallucinations, paranoia, suicidal or homicidal  ideation    Past Medical History  Diagnosis Date  . Hypertension   . Diabetes   . Hyperlipidemia   . 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 Iron deficiency anemia, unspecified 11/21/2012    At time of presentation.  Feraheme 1020 mg on 11/21/12.    Past Surgical History  Procedure Laterality Date  . Cataract extraction    . Cystoscopy w/ ureteral stent placement Right 11/03/2012    Procedure: CYSTOSCOPY WITH RETROGRADE PYELOGRAM/URETERAL STENT PLACEMENT;  Surgeon: Anner Crete, MD;  Location: [facility];  Service: Urology;  Laterality: Right;  . Portacath placement Left 11/03/2012    Procedure: INSERTION PORT-A-CATH;  Surgeon: Marlane Hatcher, MD;  Location: [facility];  Service: General;  Laterality: Left;  Insertion Port-A-Cath Left Subclavian    Medications:  HOME MEDS: Prior to Admission medications   Medication Sig Start Date End Date Taking? Authorizing Provider  acyclovir (ZOVIRAX) 400 MG tablet Take 1 tablet (400 mg total) by mouth daily. 11/21/12  Yes Randall An, MD  allopurinol (ZYLOPRIM) 300 MG tablet Take 1 tablet (300 mg total) by mouth daily. 11/21/12  Yes Randall An, MD  atorvastatin (LIPITOR) 80 MG tablet Take 80 mg by mouth every morning.   Yes Historical Provider, MD  dexamethasone (DECADRON) 4 MG tablet Take 2 tablets (8 mg total) by mouth 2 (two) times daily with a meal. Take daily starting the day after chemotherapy for 2 days. Take with food. 11/21/12  Yes Randall An, MD  furosemide (LASIX) 20 MG tablet Take 20 mg by mouth daily.   Yes Historical Provider, MD  glipiZIDE (GLUCOTROL) 10 MG tablet Take 10 mg  by mouth 2 (two) times daily before a meal.   Yes Historical Provider, MD  insulin detemir (LEVEMIR FLEXPEN) 100 UNIT/ML injection Inject 35 Units into the skin daily.    Yes Historical Provider, MD  LORazepam (ATIVAN) 0.5 MG tablet Take 0.5 mg by mouth every 4 (four) hours as needed (nausea).    Yes Historical Provider, MD  ondansetron (ZOFRAN) 8 MG tablet  Take 1 tablet (8 mg total) by mouth 2 (two) times daily. Take two times a day starting the day after chemo for 2 days. Then take two times a day as needed for nausea or vomiting. 11/21/12  Yes Randall An, MD  sulfamethoxazole-trimethoprim (BACTRIM DS,SEPTRA DS) 800-160 MG per tablet Take one tablet on Monday-Wednesday-and Friday for 6 months 11/22/12  Yes Ellouise Newer, PA-C  acetaminophen (TYLENOL ARTHRITIS PAIN) 650 MG CR tablet Take 650 mg by mouth every 8 (eight) hours as needed for pain.    Historical Provider, MD  docusate sodium (COLACE) 100 MG capsule Take 100-200 mg by mouth daily.    Historical Provider, MD  losartan (COZAAR) 100 MG tablet Take 100 mg by mouth every morning.    Historical Provider, MD  Multiple Vitamin (MULTIVITAMIN WITH MINERALS) TABS Take 1 tablet by mouth daily.    Historical Provider, MD     Allergies:  No Known Allergies  Social History:   reports that she has never smoked. She has never used smokeless tobacco. She reports that she does not drink alcohol or use illicit drugs.  Family History: Family History  Problem Relation Age of Onset  . Cancer Father   . Cancer Brother      Physical Exam: Filed Vitals:   12/12/12 2117 12/12/12 2120 12/12/12 2200 12/13/12 0002  BP: 141/46 141/46 135/54 144/53  Pulse: 95 88 89 86  Temp: 100.1 F (37.8 C) 100.1 F (37.8 C)    TempSrc: Oral Oral    Resp: 20   20  Height: 5\' 5"  (1.651 m) 5\' 2"  (1.575 m)    Weight: 104.327 kg (230 lb) 104.327 kg (230 lb)    SpO2: 97% 98% 96% 96%   Blood pressure 144/53, pulse 86, temperature 100.1 F (37.8 C), temperature source Oral, resp. rate 20, height 5\' 2"  (1.575 m), weight 104.327 kg (230 lb), SpO2 96.00%.  GEN:  Ill-looking obese Caucasian lady in lying bed; complaining of being cold; having generalized shaking chills; unable to be cooperative with exam PSYCH:  alert and oriented x2;  anxious nor depressed; affect is appropriate. HEENT: Mucous membranes pink and  anicteric; PERRLA; EOM intact; thick neck or carotid bruit; no JVD; Breasts:: Not examined CHEST WALL: No tenderness CHEST: Normal respiration, clear to auscultation bilaterally HEART: Regular rate and rhythm; no murmurs rubs or gallops BACK: No kyphosis no scoliosis; no CVA tenderness ABDOMEN: Obese, soft non-tender; no masses, no organomegaly, normal abdominal bowel sounds; large pannus; right side intertriginous candida. Rectal Exam: Not done EXTREMITIES: ; age-appropriate arthropathy of the hands and knees; no edema; no ulcerations. Genitalia: not examined PULSES: 2+ and symmetric SKIN: Normal hydration no rash or ulceration CNS: Cranial nerves 2-12 grossly intact no focal lateralizing neurologic deficit   Labs on Admission:  Basic Metabolic Panel:  Recent Labs Lab 12/06/12 1059 12/12/12 2310  NA 138 131*  K 4.2 4.6  CL 101 98  CO2 24 20  GLUCOSE 149* 208*  BUN 76* 33*  CREATININE 3.06* 2.89*  CALCIUM 8.4 8.7   Liver Function Tests:  Recent Labs Lab  12/06/12 1059 12/12/12 2310  AST 19 22  ALT 25 21  ALKPHOS 92 75  BILITOT 0.3 0.2*  PROT 6.3 6.0  ALBUMIN 3.2* 2.6*   No results found for this basename: LIPASE, AMYLASE,  in the last 168 hours No results found for this basename: AMMONIA,  in the last 168 hours CBC:  Recent Labs Lab 12/06/12 1059 12/12/12 2310  WBC 16.2* 8.7  NEUTROABS 14.6* 8.0*  HGB 11.7* 10.0*  HCT 35.8* 30.8*  MCV 79.7 82.8  PLT 281 191   Cardiac Enzymes: No results found for this basename: CKTOTAL, CKMB, CKMBINDEX, TROPONINI,  in the last 168 hours BNP: No components found with this basename: POCBNP,  D-dimer: No components found with this basename: D-DIMER,  CBG: No results found for this basename: GLUCAP,  in the last 168 hours  Radiological Exams on Admission: Dg Chest Portable 1 View  12/12/2012   *RADIOLOGY REPORT*  Clinical Data: Nausea and weakness.  PORTABLE CHEST - 1 VIEW  Comparison: Chest x-ray 11/03/2012.  Findings:  Left subclavian single lumen power Port-A-Cath with tip terminating in the mid to distal superior vena cava.  Lung volumes are within normal limits.  No acute consolidative airspace disease. No pleural effusions.  No evidence of pulmonary edema.  Heart size is within normal limits. The patient is rotated to the left on today's exam, resulting in distortion of the mediastinal contours and reduced diagnostic sensitivity and specificity for mediastinal pathology.  Atherosclerosis in the thoracic aorta.  IMPRESSION: 1.  No radiographic evidence of acute cardiopulmonary disease. 2.  Atherosclerosis.   Original Report Authenticated By: Trudie Reed, M.D.      Assessment/Plan   Active Problems:    Follicular lymphoma   Sepsis   UTI (lower urinary tract infection)   Confusion   Chronic kidney disease (CKD), stage IV (severe)   Diabetes mellitus type 2 in obese   HTN (hypertension)   PLAN: The high fever confusion makes sepsis extremely likely; we'll start IV fluids, empiric antibiotics after cultures; will check lactic acid.   [facility] consult oncology for assistance with management  Oral and diabetic medications and give sliding scale as indicated Her blood pressure is currently well maintained and will continue chronic management of her labs as well as her other chronic medical problems  Other plans as per orders.  Code Status:  full code  Family Communication: Plans discuss with patient and family at bedside Disposition Plan:    depending on hospital course  Critical care time: 60 minutes.  Makya Phillis Nocturnist [facility] Pager [phone]   12/13/2012, 1:23 AM

## 2012-12-13 NOTE — Consult Note (Signed)
ANTIBIOTIC CONSULT NOTE-Preliminary  Pharmacy Consult for Vancomycin and Cefepime Indication: Fever/SIRS  No Known Allergies  Patient Measurements: Height: 5\' 5"  (165.1 cm) Weight: 230 lb (104.327 kg) IBW/kg (Calculated) : 57   Vital Signs: Temp: 104 F (40 C) (06/04 0359) Temp src: Oral (06/04 0359) BP: 147/91 mmHg (06/04 0224) Pulse Rate: 120 (06/04 0224)  Labs:  Recent Labs  12/12/12 2310  WBC 8.7  HGB 10.0*  PLT 191  CREATININE 2.89*    Estimated Creatinine Clearance: 21.4 ml/min (by C-G formula based on Cr of 2.89).  No results found for this basename: VANCOTROUGH, VANCOPEAK, VANCORANDOM, GENTTROUGH, GENTPEAK, GENTRANDOM, TOBRATROUGH, TOBRAPEAK, TOBRARND, AMIKACINPEAK, AMIKACINTROU, AMIKACIN,  in the last 72 hours   Microbiology: No results found for this or any previous visit (from the past 720 hour(s)).  Medical History: Past Medical History  Diagnosis Date  . Hypertension   . Diabetes   . Hyperlipidemia   . 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 Iron deficiency anemia, unspecified 11/21/2012    At time of presentation.  Feraheme 1020 mg on 11/21/12.    Medications:  Cefepime 2 GM IV for one dose in the ED.  Assessment: 72 yo female with 2 day history of nausea, presented to the ED with chills, fever, weakness and UTI. Pt received chemotherapy 2 weeks ago for lymphoma. She will be started on antibiotics empirically for SIRS.   Goal of Therapy:  Vancomycin troughs 15-20 mcg/ml Eradication of infection   Plan:  Preliminary review of pertinent patient information completed.  Protocol will be initiated with a one-time dose of Vancomycin 2 Gm IV.  Jeani Hawking clinical pharmacist will complete review during morning rounds to assess patient and finalize treatment regimen.  Arelia Sneddon, [facility] 12/13/2012,4:27 AM

## 2012-12-13 NOTE — Consult Note (Signed)
[facility] Consultation Oncology  Name: [name] [name]      MRN: [id number]    Location: A220/A220-01  Date: 12/13/2012 Time:1:34 PM   REFERRING PHYSICIAN:  Lilly Cove, MD  REASON FOR CONSULT:  Follicular lymhoma   DIAGNOSIS:  Follicular Lymphoma  HISTORY OF PRESENT ILLNESS:   This is a 72 year old Caucasian female who is well-known to the [facility] where she receives chemotherapy for her recently diagnosed Follicular Lymphoma.  She started chemotherapy consisting of Rituxan and Bendamustine D1 and Bendamustine alone on D2 every 28 days.  She was treated for her first cycle on 11/28/2012 and 11/29/2012 and is D16 cycle 1 of chemotherapy without Neulasta support.   She is admitted to the hospital with progressive weakness.  In the ED she was noted to be febrile and therefore she was admitted to the hospital.    UA performed shows small leukocytes and urine culture is negative for growth < 24 hours.  Chest X-ray is negative for any causes of infection.  Blood cultures thus far are negative as well.    [name] [name] does not participate much in conversation at this time.  She is known to be a simple-minded person, but in the clinic, she participates appropriately.   She denies any complaints to me while in the hospital room.   Her husband and son, [name] [name], are present at the bedside.  The patient is S/P right ureteral double-J stent insertion by Dr. 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     Oncologically, she denies any complaints and ROS questioning is negative.   PAST MEDICAL HISTORY:   Past Medical History  Diagnosis Date  . Hypertension   . Diabetes   . Hyperlipidemia   . 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 Iron deficiency anemia, unspecified 11/21/2012    At time of presentation.  Feraheme 1020 mg on 11/21/12.    ALLERGIES: No Known Allergies    MEDICATIONS: I have reviewed the patient's current medications.     PAST  SURGICAL HISTORY Past Surgical History  Procedure Laterality Date  . Cataract extraction    . Cystoscopy w/ ureteral stent placement Right 11/03/2012    Procedure: CYSTOSCOPY WITH RETROGRADE PYELOGRAM/URETERAL STENT PLACEMENT;  Surgeon: Anner Crete, MD;  Location: [facility];  Service: Urology;  Laterality: Right;  . Portacath placement Left 11/03/2012    Procedure: INSERTION PORT-A-CATH;  Surgeon: Marlane Hatcher, MD;  Location: [facility];  Service: General;  Laterality: Left;  Insertion Port-A-Cath Left Subclavian    FAMILY HISTORY: Family History  Problem Relation Age of Onset  . Cancer Father   . Cancer Brother     SOCIAL HISTORY:  reports that she has never smoked. She has never used smokeless tobacco. She reports that she does not drink alcohol or use illicit drugs.  PERFORMANCE STATUS: The patient's performance status is 3 - Symptomatic, >50% confined to bed  PHYSICAL EXAM: Most Recent Vital Signs: Blood pressure 119/43, pulse 79, temperature 99 F (37.2 C), temperature source Oral, resp. rate 22, height 5\' 5"  (1.651 m), weight 230 lb (104.327 kg), SpO2 95.00%. General appearance: alert, cooperative, appears older than stated age, no distress and slowed mentation Head: Normocephalic, without obvious abnormality, atraumatic Eyes: negative findings: conjunctivae and sclerae normal, corneas clear and pupils equal, round, reactive to light and accomodation Heart: RRR Abdomen: soft, non-tender; bowel sounds normal; no masses,  no organomegaly Skin: Skin color, texture, turgor normal. No rashes or lesions  Neurologic: Mental status: alertness: alert, orientation: person, affect: blunted Port-a-cath: No erythema, heat, or discharge  LABORATORY DATA:  Results for orders placed during the hospital encounter of 12/12/12 (from the past 48 hour(s))  CBC WITH DIFFERENTIAL     Status: Abnormal   Collection Time    12/12/12 11:10 PM      Result Value Range   WBC 8.7  4.0 - 10.5 K/uL   RBC  3.72 (*) 3.87 - 5.11 MIL/uL   Hemoglobin 10.0 (*) 12.0 - 15.0 g/dL   HCT 16.1 (*) 09.6 - 04.5 %   MCV 82.8  78.0 - 100.0 fL   MCH 26.9  26.0 - 34.0 pg   MCHC 32.5  30.0 - 36.0 g/dL   RDW 40.9 (*) 81.1 - 91.4 %   Platelets 191  150 - 400 K/uL   Neutrophils Relative % 92 (*) 43 - 77 %   Neutro Abs 8.0 (*) 1.7 - 7.7 K/uL   Lymphocytes Relative 5 (*) 12 - 46 %   Lymphs Abs 0.4 (*) 0.7 - 4.0 K/uL   Monocytes Relative 3  3 - 12 %   Monocytes Absolute 0.3  0.1 - 1.0 K/uL   Eosinophils Relative 0  0 - 5 %   Eosinophils Absolute 0.0  0.0 - 0.7 K/uL   Basophils Relative 0  0 - 1 %   Basophils Absolute 0.0  0.0 - 0.1 K/uL  CULTURE, BLOOD (ROUTINE X 2)     Status: None   Collection Time    12/12/12 11:10 PM      Result Value Range   Specimen Description RIGHT ANTECUBITAL     Special Requests BOTTLES DRAWN AEROBIC AND ANAEROBIC 6CC EACH     Culture NO GROWTH <24 HRS     Report Status PENDING    COMPREHENSIVE METABOLIC PANEL     Status: Abnormal   Collection Time    12/12/12 11:10 PM      Result Value Range   Sodium 131 (*) 135 - 145 mEq/L   Potassium 4.6  3.5 - 5.1 mEq/L   Chloride 98  96 - 112 mEq/L   CO2 20  19 - 32 mEq/L   Glucose, Bld 208 (*) 70 - 99 mg/dL   BUN 33 (*) 6 - 23 mg/dL   Creatinine, Ser 7.82 (*) 0.50 - 1.10 mg/dL   Calcium 8.7  8.4 - 95.6 mg/dL   Total Protein 6.0  6.0 - 8.3 g/dL   Albumin 2.6 (*) 3.5 - 5.2 g/dL   AST 22  0 - 37 U/L   ALT 21  0 - 35 U/L   Alkaline Phosphatase 75  39 - 117 U/L   Total Bilirubin 0.2 (*) 0.3 - 1.2 mg/dL   GFR calc non Af Amer 15 (*) >90 mL/min   GFR calc Af Amer 18 (*) >90 mL/min   Comment:            The eGFR has been calculated     using the CKD EPI equation.     This calculation has not been     validated in all clinical     situations.     eGFR's persistently     <90 mL/min signify     possible Chronic Kidney Disease.  CULTURE, BLOOD (ROUTINE X 2)     Status: None   Collection Time    12/12/12 11:15 PM      Result Value  Range   Specimen Description BLOOD LEFT  HAND     Special Requests BOTTLES DRAWN AEROBIC ONLY 6CC     Culture NO GROWTH <24 HRS     Report Status PENDING    URINALYSIS, ROUTINE W REFLEX MICROSCOPIC     Status: Abnormal   Collection Time    12/12/12 11:25 PM      Result Value Range   Color, Urine YELLOW  YELLOW   APPearance HAZY (*) CLEAR   Specific Gravity, Urine 1.015  1.005 - 1.030   pH 6.5  5.0 - 8.0   Glucose, UA 100 (*) NEGATIVE mg/dL   Hgb urine dipstick SMALL (*) NEGATIVE   Bilirubin Urine NEGATIVE  NEGATIVE   Ketones, ur NEGATIVE  NEGATIVE mg/dL   Protein, ur 30 (*) NEGATIVE mg/dL   Urobilinogen, UA 0.2  0.0 - 1.0 mg/dL   Nitrite NEGATIVE  NEGATIVE   Leukocytes, UA SMALL (*) NEGATIVE  URINE MICROSCOPIC-ADD ON     Status: Abnormal   Collection Time    12/12/12 11:25 PM      Result Value Range   Squamous Epithelial / LPF RARE  RARE   WBC, UA 21-50  <3 WBC/hpf   Bacteria, UA FEW (*) RARE  GLUCOSE, CAPILLARY     Status: Abnormal   Collection Time    12/13/12  3:55 AM      Result Value Range   Glucose-Capillary 137 (*) 70 - 99 mg/dL  LACTIC ACID, PLASMA     Status: None   Collection Time    12/13/12  4:56 AM      Result Value Range   Lactic Acid, Venous 1.0  0.5 - 2.2 mmol/L  MAGNESIUM     Status: None   Collection Time    12/13/12  4:56 AM      Result Value Range   Magnesium 1.5  1.5 - 2.5 mg/dL  CBC     Status: Abnormal   Collection Time    12/13/12  4:56 AM      Result Value Range   WBC 7.4  4.0 - 10.5 K/uL   RBC 3.53 (*) 3.87 - 5.11 MIL/uL   Hemoglobin 9.2 (*) 12.0 - 15.0 g/dL   HCT 21.3 (*) 08.6 - 57.8 %   MCV 82.2  78.0 - 100.0 fL   MCH 26.1  26.0 - 34.0 pg   MCHC 31.7  30.0 - 36.0 g/dL   RDW 46.9 (*) 62.9 - 52.8 %   Platelets 166  150 - 400 K/uL  COMPREHENSIVE METABOLIC PANEL     Status: Abnormal   Collection Time    12/13/12  4:56 AM      Result Value Range   Sodium 133 (*) 135 - 145 mEq/L   Potassium 4.5  3.5 - 5.1 mEq/L   Chloride 102  96 -  112 mEq/L   CO2 21  19 - 32 mEq/L   Glucose, Bld 161 (*) 70 - 99 mg/dL   BUN 32 (*) 6 - 23 mg/dL   Creatinine, Ser 4.13 (*) 0.50 - 1.10 mg/dL   Calcium 8.3 (*) 8.4 - 10.5 mg/dL   Total Protein 5.4 (*) 6.0 - 8.3 g/dL   Albumin 2.4 (*) 3.5 - 5.2 g/dL   AST 23  0 - 37 U/L   ALT 20  0 - 35 U/L   Alkaline Phosphatase 67  39 - 117 U/L   Total Bilirubin 0.3  0.3 - 1.2 mg/dL   GFR calc non Af Amer 16 (*) >90 mL/min  GFR calc Af Amer 18 (*) >90 mL/min   Comment:            The eGFR has been calculated     using the CKD EPI equation.     This calculation has not been     validated in all clinical     situations.     eGFR's persistently     <90 mL/min signify     possible Chronic Kidney Disease.  APTT     Status: None   Collection Time    12/13/12  4:56 AM      Result Value Range   aPTT 35  24 - 37 seconds  PROTIME-INR     Status: None   Collection Time    12/13/12  4:56 AM      Result Value Range   Prothrombin Time 14.8  11.6 - 15.2 seconds   INR 1.18  0.00 - 1.49  MRSA PCR SCREENING     Status: None   Collection Time    12/13/12  5:04 AM      Result Value Range   MRSA by PCR NEGATIVE  NEGATIVE   Comment:            The GeneXpert MRSA Assay (FDA     approved for NASAL specimens     only), is one component of a     comprehensive MRSA colonization     surveillance program. It is not     intended to diagnose MRSA     infection nor to guide or     monitor treatment for     MRSA infections.  GLUCOSE, CAPILLARY     Status: Abnormal   Collection Time    12/13/12  7:38 AM      Result Value Range   Glucose-Capillary 191 (*) 70 - 99 mg/dL  GLUCOSE, CAPILLARY     Status: Abnormal   Collection Time    12/13/12 11:36 AM      Result Value Range   Glucose-Capillary 118 (*) 70 - 99 mg/dL      RADIOGRAPHY: Dg Chest Portable 1 View  12/12/2012   *RADIOLOGY REPORT*  Clinical Data: Nausea and weakness.  PORTABLE CHEST - 1 VIEW  Comparison: Chest x-ray 11/03/2012.  Findings: Left  subclavian single lumen power Port-A-Cath with tip terminating in the mid to distal superior vena cava.  Lung volumes are within normal limits.  No acute consolidative airspace disease. No pleural effusions.  No evidence of pulmonary edema.  Heart size is within normal limits. The patient is rotated to the left on today's exam, resulting in distortion of the mediastinal contours and reduced diagnostic sensitivity and specificity for mediastinal pathology.  Atherosclerosis in the thoracic aorta.  IMPRESSION: 1.  No radiographic evidence of acute cardiopulmonary disease. 2.  Atherosclerosis.   Original Report Authenticated By: Trudie Reed, M.D.       ASSESSMENT:  1. Fever of unknown origin without neutropenia.  TMAX 24 hr 104F 2. Acute on chronic renal disease 3. S/P right ureteral double-J stent insertion by Dr. 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  4. Follicular lymphoma, D16 of cycle 1 of Rituxan/Bendamustine without Neulasta support 5. Poorly controlled DM 6. Mental status change, ?secondary to #1   PLAN:  1. Chart reviewed 2. Labs reviewed 3. Will add Neulasta to subsequent chemotherapy in light of this hospitalization 4. Continue antibiotics as deemed necessary 5. Consider urology consult due to recent right double J stent  insertion in April to rule out infection. 6. Will continue to follow as an inpatient and participate in care if necessary. 7. [facility] appointment on 6/9 at 3 PM.  Will consider deferring chemotherapy x 1 week at that time for recovery from hospitalization.  All questions were answered. The patient knows to call the clinic with any problems, questions or concerns. We can certainly see the patient much sooner if necessary.  The patient and plan discussed with Glenford Peers, MD and he is in agreement with the aforementioned.  Shadrack Brummitt

## 2012-12-13 NOTE — Progress Notes (Signed)
Inpatient Diabetes Program Recommendations  AACE/ADA: New Consensus Statement on Inpatient Glycemic Control (2013)  Target Ranges:  Prepandial:   less than 140 mg/dL      Peak postprandial:   less than 180 mg/dL (1-2 hours)      Critically ill patients:  140 - 180 mg/dL   Results for [name] [name], [name] [name] (MRN [id number]) as of 12/13/2012 09:20  Ref. Range 12/13/2012 03:55 12/13/2012 07:38  Glucose-Capillary Latest Range: 70-99 mg/dL 409 (H) 811 (H)    Inpatient Diabetes Program Recommendations Insulin - Basal: Please consider changing Levemir to daily to keep on home schedule.  Note: Patient has a history of diabetes and takes Levemir 35 units daily and Glucotrol 10 mg BID at home for diabetes management.  Currently, patient is ordered to receive Levemir 20 units QHS, Novolog 0-20 AC, and Novolog 0-5 HS for inpatient glycemic control.  Please consider changing Levemir to daily instead of at bedtime so that medication will continue on home schedule.  Will continue to follow.  Thanks, Orlando Penner, RN, MSN, CCRN Diabetes Coordinator Inpatient Diabetes Program [phone]

## 2012-12-13 NOTE — Progress Notes (Signed)
PT BEING TRANSFERRED TO ROOM 220 AS MED/SURG PT. PT IS ALERT BUTCONFUSED. VERY WEAK. ABLE TO GET OOB TO COMMODE W/ MINIMAL ASSIST.  IV IN RT WRIST PATENT. LUNGS CLEAR. VOIDING CLEAR YELLOW URINE. DENIES ANY DISCOMFORT. TRANSFER REPORT CALLED TO [facility] ON 200.

## 2012-12-13 NOTE — Progress Notes (Signed)
ANTIBIOTIC CONSULT NOTE - FOLLOW UP  Pharmacy Consult for Vancomycin and Cefepime Indication: SIRS and fever  No Known Allergies  Patient Measurements: Height: 5\' 5"  (165.1 cm) Weight: 230 lb (104.327 kg) IBW/kg (Calculated) : 57  Vital Signs: Temp: 99 F (37.2 C) (06/04 0700) Temp src: Oral (06/04 0505) BP: 114/41 mmHg (06/04 0600) Pulse Rate: 92 (06/04 0600) Intake/Output from previous day: 06/03 0701 - 06/04 0700 In: 745 [I.V.:245; IV Piggyback:500] Out: 250 [Urine:250] Intake/Output from this shift:    Labs:  Recent Labs  12/12/12 2310 12/13/12 0456  WBC 8.7 7.4  HGB 10.0* 9.2*  PLT 191 166  CREATININE 2.89* 2.83*   Estimated Creatinine Clearance: 21.8 ml/min (by C-G formula based on Cr of 2.83). No results found for this basename: VANCOTROUGH, VANCOPEAK, VANCORANDOM, GENTTROUGH, GENTPEAK, GENTRANDOM, TOBRATROUGH, TOBRAPEAK, TOBRARND, AMIKACINPEAK, AMIKACINTROU, AMIKACIN,  in the last 72 hours   Microbiology: No results found for this or any previous visit (from the past 720 hour(s)).  Anti-infectives   Start     Dose/Rate Route Frequency Ordered Stop   12/14/12 0800  vancomycin (VANCOCIN) IVPB 1000 mg/200 mL premix     1,000 mg 200 mL/hr over 60 Minutes Intravenous Every 24 hours 12/13/12 0742     12/13/12 2300  ceFEPIme (MAXIPIME) 1 g in dextrose 5 % 50 mL IVPB     1 g 100 mL/hr over 30 Minutes Intravenous Every 24 hours 12/13/12 0742     12/13/12 1000  acyclovir (ZOVIRAX) tablet 400 mg     400 mg Oral Daily 12/13/12 0419     12/13/12 0900  sulfamethoxazole-trimethoprim (BACTRIM DS,SEPTRA DS) 800-160 MG per tablet 1 tablet     1 tablet Oral Once per day on Mon Wed Fri 12/13/12 0419     12/13/12 0430  vancomycin (VANCOCIN) 2,000 mg in sodium chloride 0.9 % 500 mL IVPB     2,000 mg 250 mL/hr over 120 Minutes Intravenous  Once 12/13/12 0421 12/13/12 0630   12/13/12 0100  ceFEPIme (MAXIPIME) 2 g in dextrose 5 % 50 mL IVPB     2 g 100 mL/hr over 30 Minutes  Intravenous  Once 12/13/12 0055 12/13/12 0159     Assessment: 72yo female with PMH of lymphoma and recent chemotherapy.  Admitted now for c/o fever, chills, and progressive weakness.  SCr is elevated.  Estimated Creatinine Clearance: 21.8 ml/min (by C-G formula based on Cr of 2.83).   Pt received Vancomycin 2gm and Cefepime 2gm IV on admission last pm.  Goal of Therapy:  Vancomycin trough level 15-20 mcg/ml Eradicate infection.  Plan: Vancomycin 1gm IV q24hrs Check trough at steady state Cefepime 1gm IV q24hrs Monitor labs, renal fxn, and cultures Duration of therapy per MD  Valrie Hart A 12/13/2012,7:45 AM

## 2012-12-14 ENCOUNTER — Inpatient Hospital Stay (HOSPITAL_COMMUNITY): Payer: Medicare Other

## 2012-12-14 LAB — URINE CULTURE

## 2012-12-14 LAB — CBC
HCT: 27 % — ABNORMAL LOW (ref 36.0–46.0)
Hemoglobin: 8.7 g/dL — ABNORMAL LOW (ref 12.0–15.0)
MCV: 82.8 fL (ref 78.0–100.0)
RDW: 20.6 % — ABNORMAL HIGH (ref 11.5–15.5)
WBC: 6.1 10*3/uL (ref 4.0–10.5)

## 2012-12-14 LAB — COMPREHENSIVE METABOLIC PANEL
Albumin: 2.2 g/dL — ABNORMAL LOW (ref 3.5–5.2)
Alkaline Phosphatase: 62 U/L (ref 39–117)
BUN: 30 mg/dL — ABNORMAL HIGH (ref 6–23)
Chloride: 105 mEq/L (ref 96–112)
Creatinine, Ser: 2.89 mg/dL — ABNORMAL HIGH (ref 0.50–1.10)
GFR calc Af Amer: 18 mL/min — ABNORMAL LOW (ref >90)
GFR calc non Af Amer: 15 mL/min — ABNORMAL LOW (ref >90)
Glucose, Bld: 124 mg/dL — ABNORMAL HIGH (ref 70–99)
Potassium: 4.1 mEq/L (ref 3.5–5.1)
Total Bilirubin: 0.2 mg/dL — ABNORMAL LOW (ref 0.3–1.2)

## 2012-12-14 LAB — GLUCOSE, CAPILLARY
Glucose-Capillary: 89 mg/dL (ref 70–99)
Glucose-Capillary: 135 mg/dL — ABNORMAL HIGH (ref 70–99)

## 2012-12-14 NOTE — Progress Notes (Signed)
Mrs Dam is active with [facility] Care Management services as a benefit of her [facility] insurance. [facility] Care Management will continue to follow upon hospital discharge. Will make inpatient RNCM aware.  Raiford Noble, MSN-Ed, RN,BSN- [facility] [phone]

## 2012-12-14 NOTE — Progress Notes (Signed)
[name] [name]:[id number] DOB: 06/24/1941 DOA: 12/12/2012 PCP: [name], MD   Subjective: This lady, who has non-Hodgkin's lymphoma and is being treated with chemotherapy, came in  with what appears to be sepsis from UTI. She is on intravenous antibiotics. Blood and urine cultures have been taken. Yesterday she had another fever of 101. She feels reasonably well this morning, somewhat drowsy.           Physical Exam: Blood pressure 116/43, pulse 90, temperature 101 F (38.3 C), temperature source Oral, resp. rate 24, height 5\' 5"  (1.651 m), weight 104.327 kg (230 lb), SpO2 97.00%. She looks systemically well, although she is drowsy, I do not believe she is frankly toxic at this point. Heart sounds are present and in sinus rhythm. There is no gallop rhythm. There are no murmurs. Lung fields are clear. Abdomen is soft and nontender. She is obese. She is alert but not quite orientated in time. There are no focal neurological signs.   Investigations:     Basic Metabolic Panel:  Recent Labs  81/19/14 2310 12/13/12 0456 12/14/12 0456  NA 131* 133* 134*  K 4.6 4.5 4.1  CL 98 102 105  CO2 20 21 19   GLUCOSE 208* 161* 124*  BUN 33* 32* 30*  CREATININE 2.89* 2.83* 2.89*  CALCIUM 8.7 8.3* 8.4  MG  --  1.5  --    Liver Function Tests:  Recent Labs  12/13/12 0456 12/14/12 0456  AST 23 66*  ALT 20 45*  ALKPHOS 67 62  BILITOT 0.3 0.2*  PROT 5.4* 5.1*  ALBUMIN 2.4* 2.2*     CBC:  Recent Labs  12/12/12 2310 12/13/12 0456 12/14/12 0456  WBC 8.7 7.4 6.1  NEUTROABS 8.0*  --   --   HGB 10.0* 9.2* 8.7*  HCT 30.8* 29.0* 27.0*  MCV 82.8 82.2 82.8  PLT 191 166 150    Dg Chest Portable 1 View  12/12/2012   *RADIOLOGY REPORT*  Clinical Data: Nausea and weakness.  PORTABLE CHEST - 1 VIEW  Comparison: Chest x-ray 11/03/2012.  Findings: Left subclavian single lumen power Port-A-Cath with tip terminating in the mid to distal superior vena cava.  Lung volumes  are within normal limits.  No acute consolidative airspace disease. No pleural effusions.  No evidence of pulmonary edema.  Heart size is within normal limits. The patient is rotated to the left on today's exam, resulting in distortion of the mediastinal contours and reduced diagnostic sensitivity and specificity for mediastinal pathology.  Atherosclerosis in the thoracic aorta.  IMPRESSION: 1.  No radiographic evidence of acute cardiopulmonary disease. 2.  Atherosclerosis.   Original Report Authenticated By: Trudie Reed, M.D.      Medications: I have reviewed the patient's current medications.  Impression: 1. Sepsis secondary to UTI. 2. Acute on chronic kidney disease, stable. Seen by urology yesterday, appreciate recommendations. 3. Non-Hodgkin's lymphoma, had chemotherapy approximately 2 weeks ago. 4. Type 2 diabetes mellitus. 5. Hypertension. 6. Obesity.     Plan: 1. Continue with intravenous vancomycin and Maxipime. 2. Continue with IV fluids.   Consultants:  Oncology.  Urology, Dr. Jerre Simon.   Procedures:  None.   Antibiotics:  Vancomycin IV started 12/13/2012.   Maxipime IV started 12/13/2012.                   Code Status: Full code.  Family Communication: Discussed plan with patient at the bedside.   Disposition Plan: Home when medically stable.  Time spent: 20 minutes.  LOS: 2 days   Wilson Singer Pager [phone]  12/14/2012, 8:25 AM

## 2012-12-14 NOTE — Progress Notes (Signed)
Called report to [facility], LPN on Dept 161.  Verbalized understanding.  Pt transferred to rm 332 via bed in safe and stable condition. Schonewitz, Candelaria Stagers 12/14/2012

## 2012-12-14 NOTE — Progress Notes (Signed)
Better temp now 98.9 did ske to 102,2 last nite. Bp128/69 pulse 88/m spo2 98%bun 27 creatinine 2.86 she is on maxipeme vancomycine and bactrim doses adjusted by [place]

## 2012-12-14 NOTE — Consult Note (Signed)
NAMECAROLEE, CHANNELL                ACCOUNT NO.:  [id number]  MEDICAL RECORD NO.:  [id number]  LOCATION:  A220                          FACILITY:  [facility]  PHYSICIAN:  Ky Barban, M.D.DATE OF BIRTH:  1940/11/17  DATE OF CONSULTATION: DATE OF DISCHARGE:                                CONSULTATION   CHIEF COMPLAINT:  Fever and chills, severe right hydronephrosis.  HISTORY OF PRESENT ILLNESS:  A 72 year old female has multiple medical problems which include hypertension, diabetes, hyperlipidemia, and non- Hodgkin lymphoma, iron-deficiency anemia.  At the end of April, she was in the hospital with right flank pain.  She was found to have severe right hydronephrosis, and Dr. Chauncy Lean put a double-J stent on the right side.  There was a mass around the distal right ureter, which was subsequently biopsied with a CT scan and was found to have non-Hodgkin lymphoma.  I reviewed the notes from Dr. Mariel Sleet who is her oncologist and he has seen this patient in the past, but the patient does not remember anything about that, but since they put the stent, we had not followed.  Renal ultrasound shows the hydronephrosis has almost completely gone.  Right kidney is atrophic.  She was started on chemotherapy for this non-Hodgkin lymphoma and the hydronephrosis has gone.  It is probably because of the stent.  The patient does not know if the mass is still there or gone.  PERSONAL HISTORY:  She does not smoke or drink.  REVIEW OF SYSTEMS:  Unremarkable.  PHYSICAL EXAMINATION:  VITAL SIGNS:  Blood pressure is 140/80, temperature is 102. GENERAL:  She is fully conscious, alert, oriented, was able to tell me about a few things and a lot of history was provided by her husband. Also, I obtained part of the history from the medical records. ABDOMEN:  Soft, flat.  Liver, spleen, kidneys are not palpable. PELVIC:  Deferred. EXTREMITIES:  Normal.  LABORATORY DATA:  On admission, yesterday, her WBC count  was 8.7, hematocrit 30.8.  Today, her white count is 7.4, hematocrit 29%. Yesterday, her sodium was 131, today it is 133, potassium 4.5 today, 4.6 yesterday, chloride 98 and 102 today.  Her BUN is 33 yesterday with creatinine 2.89.  Today, her BUN is 32, creatinine 2.83.  IMPRESSION:  Probably urinary tract infection.  I want to check a residual urine, so we will do a bladder scan to check it.  She is having frequency because of the stent which is still there and she had a renal ultrasound done yesterday on Nov 11, 2012 and Dec 01, 2012.  It says the left kidney measures 9.4 cm, right kidney is atrophic.  Right hydronephrosis seen on the previous study is no longer evident.  There is nothing mention about the mass on this.  Plan to do bladder scan to check residual.  Continue the present regimen.  We will follow.     Ky Barban, M.D.     MIJ/MEDQ  D:  12/13/2012  T:  12/14/2012  Job:  [id number]

## 2012-12-15 ENCOUNTER — Encounter: Payer: Self-pay | Admitting: Cardiology

## 2012-12-15 ENCOUNTER — Ambulatory Visit: Payer: Medicare Other | Admitting: Cardiology

## 2012-12-15 DIAGNOSIS — I517 Cardiomegaly: Secondary | ICD-10-CM

## 2012-12-15 LAB — BASIC METABOLIC PANEL
BUN: 28 mg/dL — ABNORMAL HIGH (ref 6–23)
Calcium: 8.7 mg/dL (ref 8.4–10.5)
GFR calc Af Amer: 18 mL/min — ABNORMAL LOW (ref >90)
GFR calc non Af Amer: 16 mL/min — ABNORMAL LOW (ref >90)
Glucose, Bld: 138 mg/dL — ABNORMAL HIGH (ref 70–99)
Potassium: 4.2 mEq/L (ref 3.5–5.1)

## 2012-12-15 MED ORDER — INSULIN DETEMIR 100 UNIT/ML ~~LOC~~ SOLN: 10.0000 [IU] | Freq: Every day | SUBCUTANEOUS | Status: DC

## 2012-12-15 NOTE — Discharge Summary (Signed)
Physician Discharge Summary  [name] [name]:[id number] DOB: [birth year] DOA: 12/12/2012  PCP: [name], MD  Admit date: 12/12/2012 Discharge date: 12/15/2012  Time spent: Greater than 30 minutes  Recommendations for Outpatient Follow-up:  1. Followup with primary care physician in the next week or 2.   Discharge Diagnoses:  1. Sepsis picture, presumed UTI, however urine culture was insignificant growth. Fever resolved. 2. Non-Hodgkin's follicular lymphoma, having active chemotherapy with oncologist. 3. Chronic kidney disease, stable. Previous history of severe right hydronephrosis and status post double-J stent on the right side, hydronephrosis has almost resolved. Right kidney is atrophic. 4. Type 2 diabetes mellitus. 5. Hypertension. 6. Obesity.   Discharge Condition: Stable.  Diet recommendation: Carbohydrate modified diet.  Filed Weights   12/12/12 2117 12/12/12 2120 12/15/12 0614  Weight: 104.327 kg (230 lb) 104.327 kg (230 lb) 95.5 kg (210 lb 8.6 oz)    History of present illness:  This 72 year old lady presented to the hospital with symptoms of progressive weakness and fever. Please see initial history as outlined below: [name] [name] is a 72 y.o. female. Totally obese Caucasian lady with a history of diabetes, and follicular lymphoma receiving chemotherapy for same. Last chemotherapy 2 weeks ago. For the past few days has been having anorexia fever and chills and progressive weakness. So weak today that she could not walk and EMS was called.  In the emergency room patient was febrile,dehydrated and weak and confused; urine is suggestive of infection  She does have a Port-A-Cath in place.  Hospital Course:  Patient was admitted and presumed to have a UTI. She was started on intravenous antibiotics. However urine culture did not really grow any bacteria and there was insignificant growth. Therefore antibiotics were discontinued. Her clinical condition is improved,  she is more alert and she is afebrile and hemodynamically stable. She is medically stable for discharge now. During the hospitalization, she was seen by oncology and urology also saw her. Dr. Jerre Simon, urology, felt that she will need to followup with her neurologist and there was nothing further to add at this point.  Procedures:  None.   Consultations:  Oncology, Dr. Mariel Sleet   Urology, Dr. Jerre Simon.  Discharge Exam: Filed Vitals:   12/14/12 1000 12/14/12 1518 12/15/12 0200 12/15/12 0614  BP: 128/69 130/74 135/68 120/49  Pulse: 88 87 81 84  Temp: 98.9 F (37.2 C) 98.2 F (36.8 C) 99.4 F (37.4 C) 99.3 F (37.4 C)  TempSrc: Oral Oral Oral Oral  Resp: 22 20 18 16   Height:      Weight:    95.5 kg (210 lb 8.6 oz)  SpO2: 98% 97% 93% 96%    General: She looks systemically well. She does not appear to be toxic or septic. Cardiovascular: Heart sounds are present and normal without murmurs or added sounds. Respiratory: Lung fields are clear. She is alert and orientated.  Discharge Instructions  Discharge Orders   Future Appointments Provider Department Dept Phone   12/15/2012 2:30 PM Kathlen Brunswick, MD [facility] [phone]   12/18/2012 3:00 PM Claudia Desanctis [facility] CANCER CENTER [phone]   12/26/2012 8:45 AM [facility]CER CENTER [phone]   12/27/2012 9:45 AM [facility] Team B [facility] CANCER CENTER [phone]   01/16/2013 11:30 AM Claudia Desanctis [facility] CANCER CENTER [phone]   01/23/2013 8:45 AM [facility] Team B [facility] CANCER CENTER [phone]   01/24/2013 9:45 AM [facility] Team B [facility] CANCER CENTER  [phone]   02/13/2013 11:30 AM Randall An, MD [facility] CANCER CENTER [phone]   02/20/2013 8:45 AM [facility] Team B [facility] CANCER CENTER [phone]   02/21/2013 9:45 AM [facility] Team A [facility] CENTER [phone]   Future Orders Complete By Expires     Diet - low sodium  heart healthy  As directed     Increase activity slowly  As directed         Medication List    TAKE these medications       acyclovir 400 MG tablet  Commonly known as:  ZOVIRAX  Take 1 tablet (400 mg total) by mouth daily.     allopurinol 300 MG tablet  Commonly known as:  ZYLOPRIM  Take 1 tablet (300 mg total) by mouth daily.     atorvastatin 80 MG tablet  Commonly known as:  LIPITOR  Take 80 mg by mouth every morning.     dexamethasone 4 MG tablet  Commonly known as:  DECADRON  Take 2 tablets (8 mg total) by mouth 2 (two) times daily with a meal. Take daily starting the day after chemotherapy for 2 days. Take with food.     docusate sodium 100 MG capsule  Commonly known as:  COLACE  Take 100-200 mg by mouth daily.     furosemide 20 MG tablet  Commonly known as:  LASIX  Take 20 mg by mouth daily.     glipiZIDE 10 MG tablet  Commonly known as:  GLUCOTROL  Take 10 mg by mouth 2 (two) times daily before a meal.     insulin detemir 100 UNIT/ML injection  Commonly known as:  LEVEMIR  Inject 0.1 mLs (10 Units total) into the skin daily.     LORazepam 0.5 MG tablet  Commonly known as:  ATIVAN  Take 0.5 mg by mouth every 4 (four) hours as needed (nausea).     losartan 100 MG tablet  Commonly known as:  COZAAR  Take 100 mg by mouth every morning.     multivitamin with minerals Tabs  Take 1 tablet by mouth daily.     ondansetron 8 MG tablet  Commonly known as:  ZOFRAN  Take 1 tablet (8 mg total) by mouth 2 (two) times daily. Take two times a day starting the day after chemo for 2 days. Then take two times a day as needed for nausea or vomiting.     sulfamethoxazole-trimethoprim 800-160 MG per tablet  Commonly known as:  BACTRIM DS,SEPTRA DS  Take one tablet on Monday-Wednesday-and Friday for 6 months     TYLENOL ARTHRITIS PAIN 650 MG CR tablet  Generic drug:  acetaminophen  Take 650 mg by mouth every 8 (eight) hours as needed for pain.       No Known  Allergies    The results of significant diagnostics from this hospitalization (including imaging, microbiology, ancillary and laboratory) are listed below for reference.    Significant Diagnostic Studies: Dg Abd 1 View  12/14/2012   *RADIOLOGY REPORT*  Clinical Data: Evaluate ureteral stent.  ABDOMEN - 1 VIEW  Comparison: Right retrograde pyelogram 11/03/2012  Findings: Right ureteral stent is in place and appears to be within expected position.  No suspicious calcifications.  Nonobstructive bowel gas pattern.  No organomegaly.  No supine evidence of free air.  IMPRESSION: Right ureteral stent in place.   Original Report Authenticated By: Charlett Nose, M.D.   US Renal  12/01/2012   *RADIOLOGY REPORT*  Clinical Data:  Renal insufficiency  RENAL / URINARY TRACT ULTRASOUND  Technique:  Complete ultrasound exam of the kidneys and urinary bladder was performed.  Comparison: 08/15/2012  Findings:  The right kidney measures 7.0 cm in long axis.  The left kidney measures 9.4 cm.  Right kidney is atrophic.  The right hydroureteronephrosis seen on the previous study is no longer evident.  Imaging through the anatomic pelvis reveals an unremarkable appearing urinary bladder.  Impression:  Atrophic right kidney with unremarkable left kidney.  No evidence for hydronephrosis.   Original Report Authenticated By: Kennith Center, M.D.   Dg Chest Portable 1 View  12/12/2012   *RADIOLOGY REPORT*  Clinical Data: Nausea and weakness.  PORTABLE CHEST - 1 VIEW  Comparison: Chest x-ray 11/03/2012.  Findings: Left subclavian single lumen power Port-A-Cath with tip terminating in the mid to distal superior vena cava.  Lung volumes are within normal limits.  No acute consolidative airspace disease. No pleural effusions.  No evidence of pulmonary edema.  Heart size is within normal limits. The patient is rotated to the left on today's exam, resulting in distortion of the mediastinal contours and reduced diagnostic sensitivity and  specificity for mediastinal pathology.  Atherosclerosis in the thoracic aorta.  IMPRESSION: 1.  No radiographic evidence of acute cardiopulmonary disease. 2.  Atherosclerosis.   Original Report Authenticated By: Trudie Reed, M.D.    Microbiology: Recent Results (from the past 240 hour(s))  CULTURE, BLOOD (ROUTINE X 2)     Status: None   Collection Time    12/12/12 11:10 PM      Result Value Range Status   Specimen Description BLOOD RIGHT ANTECUBITAL   Final   Special Requests BOTTLES DRAWN AEROBIC AND ANAEROBIC 6CC EACH   Final   Culture NO GROWTH 2 DAYS   Final   Report Status PENDING   Incomplete  CULTURE, BLOOD (ROUTINE X 2)     Status: None   Collection Time    12/12/12 11:15 PM      Result Value Range Status   Specimen Description BLOOD LEFT HAND   Final   Special Requests BOTTLES DRAWN AEROBIC ONLY 6CC   Final   Culture NO GROWTH 2 DAYS   Final   Report Status PENDING   Incomplete  URINE CULTURE     Status: None   Collection Time    12/12/12 11:25 PM      Result Value Range Status   Specimen Description URINE, CLEAN CATCH   Final   Special Requests NONE   Final   Culture  Setup Time 12/13/2012 18:16   Final   Colony Count 9,000 COLONIES/ML   Final   Culture INSIGNIFICANT GROWTH   Final   Report Status 12/14/2012 FINAL   Final  MRSA PCR SCREENING     Status: None   Collection Time    12/13/12  5:04 AM      Result Value Range Status   MRSA by PCR NEGATIVE  NEGATIVE Final   Comment:            The GeneXpert MRSA Assay (FDA     approved for NASAL specimens     only), is one component of a     comprehensive MRSA colonization     surveillance program. It is not     intended to diagnose MRSA     infection nor to guide or     monitor treatment for     MRSA infections.     Labs: Basic Metabolic Panel:  Recent Labs Lab 12/12/12 2310 12/13/12 0456 12/14/12 0456 12/15/12 0559  NA 131* 133* 134* 135  K 4.6 4.5 4.1 4.2  CL 98 102 105 105  CO2 20 21 19  18*   GLUCOSE 208* 161* 124* 138*  BUN 33* 32* 30* 28*  CREATININE 2.89* 2.83* 2.89* 2.80*  CALCIUM 8.7 8.3* 8.4 8.7  MG  --  1.5  --   --    Liver Function Tests:  Recent Labs Lab 12/12/12 2310 12/13/12 0456 12/14/12 0456  AST 22 23 66*  ALT 21 20 45*  ALKPHOS 75 67 62  BILITOT 0.2* 0.3 0.2*  PROT 6.0 5.4* 5.1*  ALBUMIN 2.6* 2.4* 2.2*     CBC:  Recent Labs Lab 12/12/12 2310 12/13/12 0456 12/14/12 0456  WBC 8.7 7.4 6.1  NEUTROABS 8.0*  --   --   HGB 10.0* 9.2* 8.7*  HCT 30.8* 29.0* 27.0*  MCV 82.8 82.2 82.8  PLT 191 166 150    BNP: BNP (last 3 results)  Recent Labs  12/01/12 1150  PROBNP 4827.0*   CBG:  Recent Labs Lab 12/14/12 0746 12/14/12 1150 12/14/12 1635 12/14/12 2041 12/15/12 0729  GLUCAP 89 153* 135* 191* 132*       Signed:  Akbar Sacra C  [facility] 12/15/2012, 8:58 AM

## 2012-12-15 NOTE — Consult Note (Signed)
IV removed, site WNL.  Pt given d/c instructions and new prescriptions.  Discussed home care with patient and discussed home medications, patient verbalizes understanding, teachback completed. F/U appointment in place, pt states they will keep appointment. Pt is stable at this time. Pt taken to main entrance in wheelchair by staff member. 

## 2012-12-18 ENCOUNTER — Telehealth (HOSPITAL_COMMUNITY): Payer: Self-pay

## 2012-12-18 ENCOUNTER — Encounter (HOSPITAL_COMMUNITY): Payer: Self-pay | Admitting: Oncology

## 2012-12-18 ENCOUNTER — Encounter (HOSPITAL_COMMUNITY): Payer: Medicare Other | Attending: Oncology | Admitting: Oncology

## 2012-12-18 VITALS — BP 109/69 | HR 80 | Temp 98.1°F | Resp 18 | Wt 230.3 lb

## 2012-12-18 DIAGNOSIS — C8299 Follicular lymphoma, unspecified, extranodal and solid organ sites: Secondary | ICD-10-CM | POA: Insufficient documentation

## 2012-12-18 DIAGNOSIS — N184 Chronic kidney disease, stage 4 (severe): Secondary | ICD-10-CM

## 2012-12-18 DIAGNOSIS — C8290 Follicular lymphoma, unspecified, unspecified site: Secondary | ICD-10-CM

## 2012-12-18 DIAGNOSIS — T451X5A Adverse effect of antineoplastic and immunosuppressive drugs, initial encounter: Secondary | ICD-10-CM | POA: Insufficient documentation

## 2012-12-18 DIAGNOSIS — IMO0001 Reserved for inherently not codable concepts without codable children: Secondary | ICD-10-CM

## 2012-12-18 DIAGNOSIS — C8293 Follicular lymphoma, unspecified, intra-abdominal lymph nodes: Secondary | ICD-10-CM

## 2012-12-18 DIAGNOSIS — D6481 Anemia due to antineoplastic chemotherapy: Secondary | ICD-10-CM | POA: Insufficient documentation

## 2012-12-18 LAB — COMPREHENSIVE METABOLIC PANEL
Albumin: 2.2 g/dL — ABNORMAL LOW (ref 3.5–5.2)
Alkaline Phosphatase: 93 U/L (ref 39–117)
BUN: 30 mg/dL — ABNORMAL HIGH (ref 6–23)
Calcium: 8.6 mg/dL (ref 8.4–10.5)
Creatinine, Ser: 2.48 mg/dL — ABNORMAL HIGH (ref 0.50–1.10)
GFR calc Af Amer: 21 mL/min — ABNORMAL LOW (ref >90)
Glucose, Bld: 241 mg/dL — ABNORMAL HIGH (ref 70–99)
Potassium: 3.6 mEq/L (ref 3.5–5.1)
Total Protein: 6 g/dL (ref 6.0–8.3)

## 2012-12-18 LAB — CBC WITH DIFFERENTIAL/PLATELET
Basophils Relative: 0 % (ref 0–1)
Eosinophils Absolute: 0.2 10*3/uL (ref 0.0–0.7)
Eosinophils Relative: 4 % (ref 0–5)
HCT: 26.9 % — ABNORMAL LOW (ref 36.0–46.0)
Hemoglobin: 8.6 g/dL — ABNORMAL LOW (ref 12.0–15.0)
Lymphs Abs: 0.2 10*3/uL — ABNORMAL LOW (ref 0.7–4.0)
MCH: 26.3 pg (ref 26.0–34.0)
MCHC: 32 g/dL (ref 30.0–36.0)
MCV: 82.3 fL (ref 78.0–100.0)
Monocytes Absolute: 0.4 10*3/uL (ref 0.1–1.0)
Monocytes Relative: 8 % (ref 3–12)
Neutrophils Relative %: 84 % — ABNORMAL HIGH (ref 43–77)
RBC: 3.27 MIL/uL — ABNORMAL LOW (ref 3.87–5.11)

## 2012-12-18 LAB — CULTURE, BLOOD (ROUTINE X 2)
Culture: NO GROWTH
Culture: NO GROWTH

## 2012-12-18 LAB — LACTATE DEHYDROGENASE: LDH: 245 U/L (ref 94–250)

## 2012-12-18 LAB — ABO/RH: ABO/RH(D): A POS

## 2012-12-18 LAB — SEDIMENTATION RATE: Sed Rate: 120 mm/hr — ABNORMAL HIGH (ref 0–22)

## 2012-12-18 NOTE — Addendum Note (Signed)
Addended by: Ellouise Newer on: 12/18/2012 06:00 PM   Modules accepted: Level of Service

## 2012-12-18 NOTE — Telephone Encounter (Signed)
Will place order for CBC diff, CMET, LDH, ESR

## 2012-12-18 NOTE — Progress Notes (Signed)
[name], MD [address] [place] [address]  Follicular lymphoma - Plan: CBC with Differential, Comprehensive metabolic panel, Lactate dehydrogenase, Sedimentation rate, CBC with Differential, Comprehensive metabolic panel, Lactate dehydrogenase, Sedimentation rate  Antineoplastic chemotherapy induced anemia(285.3) - Plan: 0.9 %  sodium chloride infusion, sodium chloride 0.9 % injection 10 mL, heparin lock flush 100 unit/mL, sodium chloride 0.9 % injection 3 mL, Type and screen, Prepare RBC, Transfuse RBC, furosemide (LASIX) injection 20 mg, Type and screen, Prepare RBC  CURRENT THERAPY: S/P cycle 1 of Bendamustine D1 and D2 and Rituxan on D1 starting on 11/28/2012  INTERVAL HISTORY: [name] [name] 72 y.o. 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 AND iron deficiency anemia at time of presentation.  Unfortunately [name] was admitted to the [facility] on 12/12/2012 with Sepsis-like picture presumed to be from UTI without significant growth on urine culture.  She was treated with IV antibiotics and fever resolved.  Urology was consulted in the Hospital and it was recommended to follow-up with urology on an outpatient basis.  She was discharged on 12/15/2012.  Due to the aforementioned hospitalization, Neulasta was added to her treatment plan.  Due to her hospitalization, we will defer chemotherapy x 1 week to allow her to recover more fully from the hospitalization.  She is agreeable to this plan.    We discussed the role of Neulasta in her treatment plan.  We discussed the risks, benefits, alternatives, and side effects of this injection including bone pain and treatment for such pain if it occurs. We discussed the role of Neulasta and her treatment plan.  I personally reviewed and went over laboratory results with the patient.  I  personally reviewed and went over radiographic studies with the patient. 2D echo performed on 6/6 showed and EF of 60-65%.  Patient had laboratory work done today and her labs reveal an anemia of 8.6 g/dL with a normal white blood cell count of 5.1 and platelet count 204. LVH is 245. She continues to have renal disease which is evident with her BUN being 30 and creatinine 2.48 status post right double-J stent placement by Dr. Annabell Howells as mentioned above.  With her anemia and her fatigue/weakness, we'll transfuse 2 units of packed red blood cells. She is due to see her primary care physician tomorrow and since her symptomatology is mild, we will transfuse 2 units on Wednesday.  Otherwise, oncologically, the patient denies any complaints of ROS questioning is negative.   Past Medical History  Diagnosis Date  . Hypertension   . Diabetes mellitus, type 2   . Hyperlipidemia   . 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 Iron deficiency anemia 11/21/2012    At time of presentation.  Feraheme 1020 mg on 11/21/12.    has Follicular lymphoma; Iron deficiency anemia; Chronic kidney disease (CKD), stage IV (severe); Diabetes mellitus type 2 in obese; and Hypertension on her problem list.     has No Known Allergies.  Ms. [name] does not currently have medications on file.  Past Surgical History  Procedure Laterality Date  . Cataract extraction    . Cystoscopy w/ ureteral stent placement Right 11/03/2012    Procedure: CYSTOSCOPY WITH RETROGRADE PYELOGRAM/URETERAL STENT PLACEMENT;  Surgeon: Anner Crete, MD;  Location: [facility];  Service: Urology;  Laterality: Right;  . Portacath placement Left 11/03/2012  Procedure: INSERTION PORT-A-CATH;  Surgeon: Marlane Hatcher, MD;  Location: [facility];  Service: General;  Laterality: Left;  Insertion Port-A-Cath Left Subclavian    Denies any headaches, dizziness, double vision, fevers, chills, night  sweats, nausea, vomiting, diarrhea, constipation, chest pain, heart palpitations, shortness of breath, blood in stool, black tarry stool, urinary pain, urinary burning, urinary frequency, hematuria.   PHYSICAL EXAMINATION  ECOG PERFORMANCE STATUS: 2 - Symptomatic, <50% confined to bed  Filed Vitals:   12/18/12 1500  BP: 109/69  Pulse: 80  Temp: 98.1 F (36.7 C)  Resp: 18    GENERAL:alert, no distress, well nourished, well developed, comfortable, cooperative, obese and smiling SKIN: skin color, texture, turgor are normal, no rashes or significant lesions HEAD: Normocephalic, No masses, lesions, tenderness or abnormalities EYES: normal, PERRLA, EOMI, Conjunctiva are pink and non-injected EARS: External ears normal OROPHARYNX:mucous membranes are moist  NECK: supple, no adenopathy, thyroid normal size, non-tender, without nodularity, no stridor, non-tender, trachea midline LYMPH:  not examined BREAST:not examined LUNGS: clear to auscultation  HEART: regular rate & rhythm, no murmurs, no gallops, S1 normal and S2 normal ABDOMEN:abdomen soft, non-tender and normal bowel sounds BACK: Back symmetric, no curvature. EXTREMITIES:less then 2 second capillary refill, no joint deformities, effusion, or inflammation, no skin discoloration, no cyanosis  NEURO: alert & oriented x 3 with fluent speech, no focal motor/sensory deficits, gait normal    LABORATORY DATA: CBC    Component Value Date/Time   WBC 5.1 12/18/2012 1455   RBC 3.27* 12/18/2012 1455   HGB 8.6* 12/18/2012 1455   HCT 26.9* 12/18/2012 1455   PLT 204 12/18/2012 1455   MCV 82.3 12/18/2012 1455   MCH 26.3 12/18/2012 1455   MCHC 32.0 12/18/2012 1455   RDW 20.2* 12/18/2012 1455   LYMPHSABS 0.2* 12/18/2012 1455   MONOABS 0.4 12/18/2012 1455   EOSABS 0.2 12/18/2012 1455   BASOSABS 0.0 12/18/2012 1455      Chemistry      Component Value Date/Time   NA 135 12/18/2012 1455   K 3.6 12/18/2012 1455   CL 103 12/18/2012 1455   CO2 20 12/18/2012 1455   BUN  30* 12/18/2012 1455   CREATININE 2.48* 12/18/2012 1455      Component Value Date/Time   CALCIUM 8.6 12/18/2012 1455   ALKPHOS 93 12/18/2012 1455   AST 31 12/18/2012 1455   ALT 42* 12/18/2012 1455   BILITOT 0.2* 12/18/2012 1455       RADIOGRAPHIC STUDIES:  12/15/2012  Transthoracic Echocardiography  Patient: Elizbeth, Posa MR #: [id number] Study Date: 12/15/2012 Gender: F Age: 71 Height: 165.1cm Weight: 104.3kg BSA: 2.13m^2 Pt. Status: Room: A220  SONOGRAPHER Karrie Doffing ATTENDING [place], Iva L ORDERING [place], [place] C REFERRING [place], [place] C ADMITTING Vania Rea PERFORMING Delma Freeze Penn cc:  ------------------------------------------------------------ LV EF: 60% - 65%  ------------------------------------------------------------ Indications: CHF - 428.0.  ------------------------------------------------------------ History: PMH: Congestive heart failure. PMH: sepsis, UTI, CKD, non Hodgkin's Lymphoma Risk factors: Hypertension. Diabetes mellitus.  ------------------------------------------------------------ Study Conclusions  - Left ventricle: The cavity size was mildly reduced. There was moderate concentric hypertrophy. Systolic function was normal. The estimated ejection fraction was in the range of 60% to 65%. Wall motion was normal; there were no regional wall motion abnormalities. - Aortic valve: Mildly to moderately calcified annulus. Trileaflet; mildly calcified leaflets. - Mitral valve: Calcified annulus. - Right ventricle: The cavity size was normal. Wall thickness was moderately increased. - Atrial septum: No defect or patent foramen ovale was identified. Transthoracic echocardiography. M-mode,  complete 2D, spectral Doppler, and color Doppler. Height: Height: 165.1cm. Height: 65in. Weight: Weight: 104.3kg. Weight: 229.5lb. Body mass index: BMI: 38.3kg/m^2. Body surface area: BSA: 2.82m^2. Patient status: Inpatient. Location:  Bedside.    ASSESSMENT:  1. 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 2. Hydronephrosis of right kidney secondary to #1.  S/P right double-J stent placement by Dr. Annabell Howells on 11/03/2012 3. Admission to [facility] from 12/12/2012- 12/15/2012 for presumed Sepsis-like picture.  This prompted the addition of Neulasta to her treatment plan beginning with cycle 2 of chemotherapy. 4. Poorly controlled DM 5. Iron deficiency anemia at time of presentation.  Patient Active Problem List   Diagnosis Date Noted  . Chronic kidney disease (CKD), stage IV (severe) 12/13/2012  . Diabetes mellitus type 2 in obese 12/13/2012  . Hypertension 12/13/2012  . Iron deficiency anemia 11/21/2012  . 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 I personally reviewed and went over laboratory results with the patient. 2. I personally reviewed and went over radiographic studies with the patient. 3. Pre-chemotherapy labs as ordered: CBC diff, CMET, LDH, ESR 4. Iron studies in approximately 4 weeks 5. Addition of Neulasta to treatment plan on Day 3. 6. Defer chemotherapy x 7 days due to hospitalization 7. Labs today: CBC diff, CMET, LDH, ESR 8. 2 unit PRBC transfusion on Wednesday 9. Return in 4 weeks for follow-up.  She will require restaging scans likely following cycle 3 of chemotherapy and this will be set-up during her next appt.   All questions were answered. The patient knows to call the clinic with any problems, questions or concerns. We can certainly see the patient much sooner if necessary.  Patient and plan discussed with Dr. Erline Hau and he is in agreement with the aforementioned.   Antha Niday

## 2012-12-18 NOTE — Telephone Encounter (Signed)
Call from Mr. Ziesmer.  Stated that "[name] [name] has not felt good this weekend.  Ran low grade fever on Sunday 99/6 - 99.8.  This morning she was a little nauseated and her blood sugar was 212.  I've given her some nausea medication and she's eating breakfast now."  Has appointment with PA at 3pm and I will discuss with PA if need to do any blood work today and if so will get patient to come in earlier.

## 2012-12-18 NOTE — Patient Instructions (Addendum)
[facility] Discharge Instructions  RECOMMENDATIONS MADE BY THE CONSULTANT AND ANY TEST RESULTS WILL BE SENT TO YOUR REFERRING PHYSICIAN.  EXAM FINDINGS BY THE PHYSICIAN TODAY AND SIGNS OR SYMPTOMS TO REPORT TO CLINIC OR PRIMARY PHYSICIAN: Exam and discussion by PA.  Will hold chemotherapy for an additional week.  Your hemoglobin is low and we will give you some blood on Wednesday.  MEDICATIONS PRESCRIBED:  none    SPECIAL INSTRUCTIONS/FOLLOW-UP: Chemotherapy to be deferred, and bloodTransfusion on Wednesday and follow-up as scheduled.  Thank you for choosing [facility] to provide your oncology and hematology care.  To afford each patient quality time with our providers, please arrive at least 15 minutes before your scheduled appointment time.  With your help, our goal is to use those 15 minutes to complete the necessary work-up to ensure our physicians have the information they need to help with your evaluation and healthcare recommendations.    Effective January 1st, 2014, we ask that you re-schedule your appointment with our physicians should you arrive 10 or more minutes late for your appointment.  We strive to give you quality time with our providers, and arriving late affects you and other patients whose appointments are after yours.    Again, thank you for choosing [facility].  Our hope is that these requests will decrease the amount of time that you wait before being seen by our physicians.       _____________________________________________________________  Should you have questions after your visit to [facility], please contact our office at [phone] between the hours of 8:30 a.m. and 5:00 p.m.  Voicemails left after 4:30 p.m. will not be returned until the following business day.  For prescription refill requests, have your pharmacy contact our office with your prescription refill request.

## 2012-12-20 ENCOUNTER — Encounter (HOSPITAL_BASED_OUTPATIENT_CLINIC_OR_DEPARTMENT_OTHER): Payer: Medicare Other

## 2012-12-20 VITALS — BP 146/53 | HR 64 | Temp 97.3°F | Resp 16

## 2012-12-20 DIAGNOSIS — T451X5A Adverse effect of antineoplastic and immunosuppressive drugs, initial encounter: Secondary | ICD-10-CM

## 2012-12-20 DIAGNOSIS — D6481 Anemia due to antineoplastic chemotherapy: Secondary | ICD-10-CM

## 2012-12-20 MED ORDER — FUROSEMIDE 10 MG/ML IJ SOLN
INTRAMUSCULAR | Status: AC
  Filled 2012-12-20: qty 2

## 2012-12-20 MED ORDER — SODIUM CHLORIDE 0.9 % IV SOLN
250.0000 mL | Freq: Once | INTRAVENOUS | Status: AC
  Administered 2012-12-20: 250 mL via INTRAVENOUS

## 2012-12-20 MED ORDER — FUROSEMIDE 10 MG/ML IJ SOLN
20.0000 mg | Freq: Once | INTRAMUSCULAR | Status: AC
  Administered 2012-12-20: 20 mg via INTRAVENOUS

## 2012-12-20 MED ORDER — HEPARIN SOD (PORK) LOCK FLUSH 100 UNIT/ML IV SOLN
500.0000 [IU] | Freq: Once | INTRAVENOUS | Status: AC
  Administered 2012-12-20: 500 [IU] via INTRAVENOUS
  Filled 2012-12-20: qty 5

## 2012-12-20 MED ORDER — HEPARIN SOD (PORK) LOCK FLUSH 100 UNIT/ML IV SOLN
INTRAVENOUS | Status: AC
  Filled 2012-12-20: qty 5

## 2012-12-21 LAB — TYPE AND SCREEN
Antibody Screen: NEGATIVE
Unit division: 0

## 2012-12-26 ENCOUNTER — Inpatient Hospital Stay (HOSPITAL_COMMUNITY): Payer: Medicare Other

## 2012-12-27 ENCOUNTER — Inpatient Hospital Stay (HOSPITAL_COMMUNITY): Payer: Medicare Other

## 2013-01-01 NOTE — Progress Notes (Signed)
I transfused her.  She is due for chemo tomorrow, so i will see what hgb is then.

## 2013-01-02 ENCOUNTER — Other Ambulatory Visit (HOSPITAL_COMMUNITY): Payer: Self-pay | Admitting: Oncology

## 2013-01-02 ENCOUNTER — Encounter (HOSPITAL_BASED_OUTPATIENT_CLINIC_OR_DEPARTMENT_OTHER): Payer: Medicare Other

## 2013-01-02 DIAGNOSIS — Z5111 Encounter for antineoplastic chemotherapy: Secondary | ICD-10-CM

## 2013-01-02 DIAGNOSIS — C8290 Follicular lymphoma, unspecified, unspecified site: Secondary | ICD-10-CM

## 2013-01-02 DIAGNOSIS — Z5112 Encounter for antineoplastic immunotherapy: Secondary | ICD-10-CM

## 2013-01-02 DIAGNOSIS — C8299 Follicular lymphoma, unspecified, extranodal and solid organ sites: Secondary | ICD-10-CM

## 2013-01-02 LAB — COMPREHENSIVE METABOLIC PANEL
BUN: 27 mg/dL — ABNORMAL HIGH (ref 6–23)
CO2: 24 mEq/L (ref 19–32)
Chloride: 98 mEq/L (ref 96–112)
Creatinine, Ser: 2.43 mg/dL — ABNORMAL HIGH (ref 0.50–1.10)
GFR calc non Af Amer: 19 mL/min — ABNORMAL LOW (ref >90)
Total Bilirubin: 0.2 mg/dL — ABNORMAL LOW (ref 0.3–1.2)

## 2013-01-02 LAB — CBC WITH DIFFERENTIAL/PLATELET
HCT: 34.1 % — ABNORMAL LOW (ref 36.0–46.0)
Hemoglobin: 11.2 g/dL — ABNORMAL LOW (ref 12.0–15.0)
Lymphocytes Relative: 7 % — ABNORMAL LOW (ref 12–46)
Monocytes Absolute: 0.6 10*3/uL (ref 0.1–1.0)
Monocytes Relative: 6 % (ref 3–12)
Neutro Abs: 7.6 10*3/uL (ref 1.7–7.7)
WBC: 9.2 10*3/uL (ref 4.0–10.5)

## 2013-01-02 LAB — SEDIMENTATION RATE: Sed Rate: 78 mm/hr — ABNORMAL HIGH (ref 0–22)

## 2013-01-02 LAB — LACTATE DEHYDROGENASE: LDH: 193 U/L (ref 94–250)

## 2013-01-02 MED ORDER — HEPARIN SOD (PORK) LOCK FLUSH 100 UNIT/ML IV SOLN
500.0000 [IU] | Freq: Once | INTRAVENOUS | Status: AC | PRN
  Administered 2013-01-02: 500 [IU]
  Filled 2013-01-02: qty 5

## 2013-01-02 MED ORDER — SODIUM CHLORIDE 0.9 % IV SOLN
Freq: Once | INTRAVENOUS | Status: AC
  Administered 2013-01-02: 8 mg via INTRAVENOUS
  Filled 2013-01-02: qty 4

## 2013-01-02 MED ORDER — SODIUM CHLORIDE 0.9 % IV SOLN
90.0000 mg/m2 | Freq: Once | INTRAVENOUS | Status: AC
  Administered 2013-01-02: 200 mg via INTRAVENOUS
  Filled 2013-01-02: qty 40

## 2013-01-02 MED ORDER — SODIUM CHLORIDE 0.9 % IV SOLN
Freq: Once | INTRAVENOUS | Status: AC
  Administered 2013-01-02: 11:00:00 via INTRAVENOUS

## 2013-01-02 MED ORDER — SODIUM CHLORIDE 0.9 % IJ SOLN
10.0000 mL | INTRAMUSCULAR | Status: DC | PRN
  Administered 2013-01-02: 10 mL
  Filled 2013-01-02: qty 10

## 2013-01-02 MED ORDER — ACETAMINOPHEN 325 MG PO TABS: 650.0000 mg | ORAL_TABLET | Freq: Once | ORAL | Status: DC

## 2013-01-02 MED ORDER — DIPHENHYDRAMINE HCL 25 MG PO CAPS: 50.0000 mg | ORAL_CAPSULE | Freq: Once | ORAL | Status: DC

## 2013-01-02 MED ORDER — SODIUM CHLORIDE 0.9 % IV SOLN
375.0000 mg/m2 | Freq: Once | INTRAVENOUS | Status: AC
  Administered 2013-01-02: 800 mg via INTRAVENOUS
  Filled 2013-01-02: qty 80

## 2013-01-03 ENCOUNTER — Encounter (HOSPITAL_BASED_OUTPATIENT_CLINIC_OR_DEPARTMENT_OTHER): Payer: Medicare Other

## 2013-01-03 VITALS — BP 165/49 | HR 68 | Temp 97.3°F | Resp 20 | Wt 224.6 lb

## 2013-01-03 DIAGNOSIS — Z5111 Encounter for antineoplastic chemotherapy: Secondary | ICD-10-CM

## 2013-01-03 DIAGNOSIS — C8299 Follicular lymphoma, unspecified, extranodal and solid organ sites: Secondary | ICD-10-CM

## 2013-01-03 DIAGNOSIS — C8290 Follicular lymphoma, unspecified, unspecified site: Secondary | ICD-10-CM

## 2013-01-03 DIAGNOSIS — Z452 Encounter for adjustment and management of vascular access device: Secondary | ICD-10-CM

## 2013-01-03 MED ORDER — BENDAMUSTINE HCL (LYOPHILIZED PWD) CHEMO INJECTION 100MG
90.0000 mg/m2 | Freq: Once | INTRAVENOUS | Status: AC
  Administered 2013-01-03: 200 mg via INTRAVENOUS
  Filled 2013-01-03: qty 40

## 2013-01-03 MED ORDER — ALTEPLASE 2 MG IJ SOLR
INTRAMUSCULAR | Status: AC
  Filled 2013-01-03: qty 2

## 2013-01-03 MED ORDER — SODIUM CHLORIDE 0.9 % IV SOLN
Freq: Once | INTRAVENOUS | Status: AC
  Administered 2013-01-03: 10:00:00 via INTRAVENOUS

## 2013-01-03 MED ORDER — HEPARIN SOD (PORK) LOCK FLUSH 100 UNIT/ML IV SOLN
500.0000 [IU] | Freq: Once | INTRAVENOUS | Status: DC | PRN
  Filled 2013-01-03: qty 5

## 2013-01-03 MED ORDER — SODIUM CHLORIDE 0.9 % IV SOLN
Freq: Once | INTRAVENOUS | Status: AC
  Administered 2013-01-03: 8 mg via INTRAVENOUS
  Filled 2013-01-03: qty 4

## 2013-01-03 MED ORDER — SODIUM CHLORIDE 0.9 % IJ SOLN
10.0000 mL | INTRAMUSCULAR | Status: DC | PRN
  Administered 2013-01-03: 10 mL
  Filled 2013-01-03: qty 10

## 2013-01-03 MED ORDER — ALTEPLASE 2 MG IJ SOLR
2.0000 mg | Freq: Once | INTRAMUSCULAR | Status: AC | PRN
  Administered 2013-01-03: 2 mg
  Filled 2013-01-03: qty 2

## 2013-01-04 ENCOUNTER — Encounter (HOSPITAL_COMMUNITY): Payer: Self-pay | Admitting: *Deleted

## 2013-01-04 ENCOUNTER — Encounter (HOSPITAL_BASED_OUTPATIENT_CLINIC_OR_DEPARTMENT_OTHER): Payer: Medicare Other

## 2013-01-04 ENCOUNTER — Emergency Department (HOSPITAL_COMMUNITY): Payer: Medicare Other

## 2013-01-04 ENCOUNTER — Emergency Department (HOSPITAL_COMMUNITY)
Admission: EM | Admit: 2013-01-04 | Discharge: 2013-01-04 | Disposition: A | Discharge status: Home / Self Care | Payer: Medicare Other | Attending: Emergency Medicine | Admitting: Emergency Medicine

## 2013-01-04 DIAGNOSIS — E1169 Type 2 diabetes mellitus with other specified complication: Secondary | ICD-10-CM | POA: Insufficient documentation

## 2013-01-04 DIAGNOSIS — R739 Hyperglycemia, unspecified: Secondary | ICD-10-CM

## 2013-01-04 DIAGNOSIS — C8290 Follicular lymphoma, unspecified, unspecified site: Secondary | ICD-10-CM

## 2013-01-04 DIAGNOSIS — R05 Cough: Secondary | ICD-10-CM | POA: Insufficient documentation

## 2013-01-04 DIAGNOSIS — R0602 Shortness of breath: Secondary | ICD-10-CM | POA: Insufficient documentation

## 2013-01-04 DIAGNOSIS — Z862 Personal history of diseases of the blood and blood-forming organs and certain disorders involving the immune mechanism: Secondary | ICD-10-CM | POA: Insufficient documentation

## 2013-01-04 DIAGNOSIS — E785 Hyperlipidemia, unspecified: Secondary | ICD-10-CM | POA: Insufficient documentation

## 2013-01-04 DIAGNOSIS — Z5189 Encounter for other specified aftercare: Secondary | ICD-10-CM

## 2013-01-04 DIAGNOSIS — R300 Dysuria: Secondary | ICD-10-CM | POA: Insufficient documentation

## 2013-01-04 DIAGNOSIS — K5900 Constipation, unspecified: Secondary | ICD-10-CM | POA: Insufficient documentation

## 2013-01-04 DIAGNOSIS — R059 Cough, unspecified: Secondary | ICD-10-CM | POA: Insufficient documentation

## 2013-01-04 DIAGNOSIS — C8299 Follicular lymphoma, unspecified, extranodal and solid organ sites: Secondary | ICD-10-CM

## 2013-01-04 DIAGNOSIS — I10 Essential (primary) hypertension: Secondary | ICD-10-CM | POA: Insufficient documentation

## 2013-01-04 DIAGNOSIS — Z794 Long term (current) use of insulin: Secondary | ICD-10-CM | POA: Insufficient documentation

## 2013-01-04 DIAGNOSIS — J3489 Other specified disorders of nose and nasal sinuses: Secondary | ICD-10-CM | POA: Insufficient documentation

## 2013-01-04 DIAGNOSIS — N390 Urinary tract infection, site not specified: Secondary | ICD-10-CM

## 2013-01-04 DIAGNOSIS — R110 Nausea: Secondary | ICD-10-CM | POA: Insufficient documentation

## 2013-01-04 DIAGNOSIS — Z79899 Other long term (current) drug therapy: Secondary | ICD-10-CM | POA: Insufficient documentation

## 2013-01-04 LAB — CBC WITH DIFFERENTIAL/PLATELET
Eosinophils Relative: 0 % (ref 0–5)
Lymphs Abs: 0.5 10*3/uL — ABNORMAL LOW (ref 0.7–4.0)
MCV: 85.6 fL (ref 78.0–100.0)
Monocytes Relative: 4 % (ref 3–12)
Platelets: 259 10*3/uL (ref 150–400)
RBC: 3.74 MIL/uL — ABNORMAL LOW (ref 3.87–5.11)
WBC: 27 10*3/uL — ABNORMAL HIGH (ref 4.0–10.5)

## 2013-01-04 LAB — URINALYSIS, ROUTINE W REFLEX MICROSCOPIC
Nitrite: NEGATIVE
Specific Gravity, Urine: 1.01 (ref 1.005–1.030)
Urobilinogen, UA: 0.2 mg/dL (ref 0.0–1.0)

## 2013-01-04 LAB — BASIC METABOLIC PANEL
GFR calc Af Amer: 17 mL/min — ABNORMAL LOW (ref >90)
GFR calc non Af Amer: 15 mL/min — ABNORMAL LOW (ref >90)
Glucose, Bld: 535 mg/dL — ABNORMAL HIGH (ref 70–99)
Potassium: 4.6 mEq/L (ref 3.5–5.1)
Sodium: 127 mEq/L — ABNORMAL LOW (ref 135–145)

## 2013-01-04 LAB — URINE MICROSCOPIC-ADD ON

## 2013-01-04 MED ORDER — SODIUM CHLORIDE 0.9 % IV BOLUS (SEPSIS)
1000.0000 mL | Freq: Once | INTRAVENOUS | Status: AC
  Administered 2013-01-04: 1000 mL via INTRAVENOUS

## 2013-01-04 MED ORDER — INSULIN ASPART 100 UNIT/ML ~~LOC~~ SOLN
6.0000 [IU] | Freq: Once | SUBCUTANEOUS | Status: AC
  Administered 2013-01-04: 6 [IU] via INTRAVENOUS
  Filled 2013-01-04: qty 1

## 2013-01-04 MED ORDER — SODIUM CHLORIDE 0.9 % IV SOLN
Freq: Once | INTRAVENOUS | Status: AC
  Administered 2013-01-04: 16:00:00 via INTRAVENOUS

## 2013-01-04 MED ORDER — PEGFILGRASTIM INJECTION 6 MG/0.6ML
SUBCUTANEOUS | Status: AC
  Filled 2013-01-04: qty 0.6

## 2013-01-04 MED ORDER — SODIUM CHLORIDE 0.9 % IJ SOLN
10.0000 mL | INTRAMUSCULAR | Status: DC | PRN
  Administered 2013-01-04: 10 mL via INTRAVENOUS
  Filled 2013-01-04: qty 10

## 2013-01-04 MED ORDER — HEPARIN SOD (PORK) LOCK FLUSH 100 UNIT/ML IV SOLN
INTRAVENOUS | Status: AC
  Filled 2013-01-04: qty 5

## 2013-01-04 MED ORDER — HEPARIN SOD (PORK) LOCK FLUSH 100 UNIT/ML IV SOLN
500.0000 [IU] | Freq: Once | INTRAVENOUS | Status: AC
  Administered 2013-01-04: 500 [IU] via INTRAVENOUS
  Filled 2013-01-04: qty 5

## 2013-01-04 MED ORDER — CEPHALEXIN 500 MG PO CAPS: 500.0000 mg | ORAL_CAPSULE | Freq: Three times a day (TID) | ORAL | Status: DC

## 2013-01-04 MED ORDER — PEGFILGRASTIM INJECTION 6 MG/0.6ML
6.0000 mg | Freq: Once | SUBCUTANEOUS | Status: AC
  Administered 2013-01-04: 6 mg via SUBCUTANEOUS

## 2013-01-04 MED ORDER — DEXTROSE 5 % IV SOLN
1.0000 g | Freq: Once | INTRAVENOUS | Status: AC
  Administered 2013-01-04: 1 g via INTRAVENOUS
  Filled 2013-01-04: qty 10

## 2013-01-04 MED ORDER — INSULIN ASPART 100 UNIT/ML ~~LOC~~ SOLN
5.0000 [IU] | Freq: Once | SUBCUTANEOUS | Status: AC
  Administered 2013-01-04: 5 [IU] via SUBCUTANEOUS

## 2013-01-04 NOTE — Progress Notes (Signed)
Re-accessed port. Withdrew 2cc Alteplase plus 10cc blood easily without any resistance. Flushed port per protocol. De-accessed port.Christene Lye presents today for injection per MD orders. Neulasta 6mg  administered SQ in right Abdomen. Administration without incident. Patient tolerated well.

## 2013-01-04 NOTE — ED Provider Notes (Signed)
History    This chart was scribed for Ward Givens, MD by Donne Anon, ED Scribe. This patient was seen in room APA03/APA03 and the patient's care was started at 1519.  CSN: [id number] Arrival date & time 01/04/13  1445  First MD Initiated Contact with Patient 01/04/13 1519     Chief Complaint  Patient presents with  . Hyperglycemia   The history is provided by the patient. No language interpreter was used.   HPI Comments: [name] [name] is a 72 y.o. female with hx of non Hodgkin's lymphoma that was diagnosed about 3 months ago, who presents to the Emergency Department complaining of hyperglycemia. Dr. Mariel Sleet sent her to the ED today. She came to get a Neulasta injection and she was found to have an elevated blood sugar. She is currently going through chemotherapy (began 1st treatment 5 weeks ago, went through another round 2 and 3 days ago) and is on dexamethasone. She was diagnosed with lymphoma 3 months ago and they biopsied a lymph node in her abdomen. She report she manages her DM with pills and insulin and she was diagnosed more than 15 years ago. She reports associated nausea, mild constipation, productive cough with green sputum (onset yesterday), SOB with walking (onset today), rhinorrhea, and dysuria. She is unable to state if she has had a recent fever. She denies chills, vomiting, diarrhea, sore throat, increased frequency, CP, abdominal pain, calf pain or leg swelling.   PCP Dr Denzil Magnuson Oncologist Dr Mariel Sleet   Pt denies tobacco or alcohol use.    Past Medical History  Diagnosis Date  . Hypertension   . Diabetes mellitus, type 2   . Hyperlipidemia   . 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 Iron deficiency anemia 11/21/2012    At time of presentation.  Feraheme 1020 mg on 11/21/12.   Past Surgical History  Procedure Laterality Date  . Cataract extraction    . Cystoscopy w/ ureteral stent placement  Right 11/03/2012    Procedure: CYSTOSCOPY WITH RETROGRADE PYELOGRAM/URETERAL STENT PLACEMENT;  Surgeon: Anner Crete, MD;  Location: [facility];  Service: Urology;  Laterality: Right;  . Portacath placement Left 11/03/2012    Procedure: INSERTION PORT-A-CATH;  Surgeon: Marlane Hatcher, MD;  Location: [facility];  Service: General;  Laterality: Left;  Insertion Port-A-Cath Left Subclavian   Family History  Problem Relation Age of Onset  . Cancer Father   . Cancer Brother    History  Substance Use Topics  . Smoking status: Never Smoker   . Smokeless tobacco: Never Used  . Alcohol Use: No   Lives at home Lives with spouse   OB History   Grav Para Term Preterm Abortions TAB SAB Ect Mult Living                 Review of Systems  Constitutional: Negative for chills.  HENT: Positive for rhinorrhea. Negative for sore throat.   Respiratory: Positive for cough and shortness of breath.   Cardiovascular: Negative for chest pain and leg swelling.  Gastrointestinal: Positive for nausea and constipation. Negative for vomiting, abdominal pain and diarrhea.  Genitourinary: Positive for dysuria. Negative for frequency.  All other systems reviewed and are negative.    Allergies  Review of patient's allergies indicates no known allergies.  Home Medications   Current Outpatient Rx  Name  Route  Sig  Dispense  Refill  . acetaminophen (TYLENOL ARTHRITIS PAIN) 650 MG CR  tablet   Oral   Take 650 mg by mouth every 8 (eight) hours as needed for pain.         Marland Kitchen acyclovir (ZOVIRAX) 400 MG tablet   Oral   Take 1 tablet (400 mg total) by mouth daily.   30 tablet   3   . allopurinol (ZYLOPRIM) 300 MG tablet   Oral   Take 1 tablet (300 mg total) by mouth daily.   30 tablet   3   . atorvastatin (LIPITOR) 80 MG tablet   Oral   Take 80 mg by mouth every morning.         . ciprofloxacin (CIPRO) 500 MG tablet      500 mg daily.          Marland Kitchen dexamethasone (DECADRON) 4 MG tablet   Oral    Take 2 tablets (8 mg total) by mouth 2 (two) times daily with a meal. Take daily starting the day after chemotherapy for 2 days. Take with food.   30 tablet   1   . docusate sodium (COLACE) 100 MG capsule   Oral   Take 100-200 mg by mouth daily.         . furosemide (LASIX) 20 MG tablet   Oral   Take 20 mg by mouth daily.         Marland Kitchen glipiZIDE (GLUCOTROL) 10 MG tablet   Oral   Take 10 mg by mouth 2 (two) times daily before a meal.         . insulin detemir (LEVEMIR) 100 UNIT/ML injection   Subcutaneous   Inject 0.1 mLs (10 Units total) into the skin daily.   10 mL   12   . lidocaine-prilocaine (EMLA) cream               . LORazepam (ATIVAN) 0.5 MG tablet   Oral   Take 0.5 mg by mouth every 4 (four) hours as needed (nausea).          . losartan (COZAAR) 100 MG tablet   Oral   Take 100 mg by mouth every morning.         . Multiple Vitamin (MULTIVITAMIN WITH MINERALS) TABS   Oral   Take 1 tablet by mouth daily.         . ondansetron (ZOFRAN) 8 MG tablet   Oral   Take 1 tablet (8 mg total) by mouth 2 (two) times daily. Take two times a day starting the day after chemo for 2 days. Then take two times a day as needed for nausea or vomiting.   30 tablet   1   . sulfamethoxazole-trimethoprim (BACTRIM DS,SEPTRA DS) 800-160 MG per tablet      Take one tablet on Monday-Wednesday-and Friday for 6 months   30 tablet   2    BP 151/76  Pulse 56  Temp(Src) 96.7 F (35.9 C) (Oral)  Resp 22  Wt 222 lb (100.699 kg)  BMI 36.94 kg/m2  SpO2 98%  Vital signs normal except bradycardia   Physical Exam  Nursing note and vitals reviewed. Constitutional: She is oriented to person, place, and time. She appears well-developed and well-nourished.  Non-toxic appearance. She does not appear ill. No distress.  HENT:  Head: Normocephalic and atraumatic.  Right Ear: External ear normal.  Left Ear: External ear normal.  Nose: Nose normal. No mucosal edema or rhinorrhea.   Mouth/Throat: Mucous membranes are dry. No dental abscesses or edematous.  Eyes: Conjunctivae and  EOM are normal. Pupils are equal, round, and reactive to light.  Neck: Normal range of motion and full passive range of motion without pain. Neck supple.  Cardiovascular: Regular rhythm and normal heart sounds.  Bradycardia present.  Exam reveals no gallop and no friction rub.   No murmur heard. Pulmonary/Chest: Effort normal and breath sounds normal. No respiratory distress. She has no wheezes. She has no rhonchi. She has no rales. She exhibits no tenderness and no crepitus.  Abdominal: Soft. Normal appearance and bowel sounds are normal. She exhibits no distension. There is no tenderness. There is no rebound and no guarding.  Musculoskeletal: Normal range of motion. She exhibits no edema and no tenderness.  Moves all extremities well.   Neurological: She is alert and oriented to person, place, and time. She has normal strength. No cranial nerve deficit.  Skin: Skin is warm, dry and intact. No rash noted. No erythema. No pallor.  Psychiatric: She has a normal mood and affect. Her speech is normal and behavior is normal. Her mood appears not anxious.    ED Course  Procedures (including critical care time)  Medications  0.9 %  sodium chloride infusion ( Intravenous Stopped 01/04/13 2139)  insulin aspart (novoLOG) injection 6 Units (6 Units Intravenous Given 01/04/13 1757)  sodium chloride 0.9 % bolus 1,000 mL (0 mLs Intravenous Stopped 01/04/13 2139)  cefTRIAXone (ROCEPHIN) 1 g in dextrose 5 % 50 mL IVPB (0 g Intravenous Stopped 01/04/13 1920)  insulin aspart (novoLOG) injection 6 Units (6 Units Intravenous Given 01/04/13 2018)  insulin aspart (novoLOG) injection 5 Units (5 Units Subcutaneous Given 01/04/13 2154)    DIAGNOSTIC STUDIES: Oxygen Saturation is 98% on RA, normal by my interpretation.    COORDINATION OF CARE: 3:21 PM Discussed treatment plan which includes CXR, labs, urinalysis, IV  fluids  with pt at bedside and pt agreed to plan.   6:09 PM Rechecked pt. Discussed labs. I will prescribe antibiotics for her UTI. Will give IV fluids and insulin for her hyperglycemia.  7:33 PM Rechecked pt. Informed of 433 glucose level. I will order more insulin.  PT given IV insulin until her CBG got into the 300's then she was given [place] insulin. She was given IV rocephin for her UTI.    Results for orders placed during the hospital encounter of 01/04/13  CBC WITH DIFFERENTIAL      Result Value Range   WBC 27.0 (*) 4.0 - 10.5 K/uL   RBC 3.74 (*) 3.87 - 5.11 MIL/uL   Hemoglobin 10.6 (*) 12.0 - 15.0 g/dL   HCT 54.0 (*) 98.1 - 19.1 %   MCV 85.6  78.0 - 100.0 fL   MCH 28.3  26.0 - 34.0 pg   MCHC 33.1  30.0 - 36.0 g/dL   RDW 47.8 (*) 29.5 - 62.1 %   Platelets 259  150 - 400 K/uL   Neutrophils Relative % 94 (*) 43 - 77 %   Lymphocytes Relative 2 (*) 12 - 46 %   Monocytes Relative 4  3 - 12 %   Eosinophils Relative 0  0 - 5 %   Basophils Relative 0  0 - 1 %   Neutro Abs 25.4 (*) 1.7 - 7.7 K/uL   Lymphs Abs 0.5 (*) 0.7 - 4.0 K/uL   Monocytes Absolute 1.1 (*) 0.1 - 1.0 K/uL   Eosinophils Absolute 0.0  0.0 - 0.7 K/uL   Basophils Absolute 0.0  0.0 - 0.1 K/uL   RBC Morphology ELLIPTOCYTES  WBC Morphology WHITE COUNT CONFIRMED ON SMEAR     Smear Review LARGE PLATELETS PRESENT    BASIC METABOLIC PANEL      Result Value Range   Sodium 127 (*) 135 - 145 mEq/L   Potassium 4.6  3.5 - 5.1 mEq/L   Chloride 89 (*) 96 - 112 mEq/L   CO2 22  19 - 32 mEq/L   Glucose, Bld 535 (*) 70 - 99 mg/dL   BUN 51 (*) 6 - 23 mg/dL   Creatinine, Ser 1.61 (*) 0.50 - 1.10 mg/dL   Calcium 9.0  8.4 - 09.6 mg/dL   GFR calc non Af Amer 15 (*) >90 mL/min   GFR calc Af Amer 17 (*) >90 mL/min  URINALYSIS, ROUTINE W REFLEX MICROSCOPIC      Result Value Range   Color, Urine YELLOW  YELLOW   APPearance CLOUDY (*) CLEAR   Specific Gravity, Urine 1.010  1.005 - 1.030   pH 6.0  5.0 - 8.0   Glucose, UA >1000 (*)  NEGATIVE mg/dL   Hgb urine dipstick SMALL (*) NEGATIVE   Bilirubin Urine NEGATIVE  NEGATIVE   Ketones, ur NEGATIVE  NEGATIVE mg/dL   Protein, ur NEGATIVE  NEGATIVE mg/dL   Urobilinogen, UA 0.2  0.0 - 1.0 mg/dL   Nitrite NEGATIVE  NEGATIVE   Leukocytes, UA SMALL (*) NEGATIVE  URINE MICROSCOPIC-ADD ON      Result Value Range   WBC, UA TOO NUMEROUS TO COUNT  <3 WBC/hpf   RBC / HPF 3-6  <3 RBC/hpf   Bacteria, UA FEW (*) RARE  GLUCOSE, CAPILLARY      Result Value Range   Glucose-Capillary 433 (*) 70 - 99 mg/dL  GLUCOSE, CAPILLARY      Result Value Range   Glucose-Capillary 360 (*) 70 - 99 mg/dL   Laboratory interpretation all normal except leukocytosis (elevated for several reasons, the patient is on Decadron while she's on her chemotherapy, she also received a Neulasta injection today), hyperglycemia, stable renal insufficiency, compensatory hyponatremia from the hyperglycemia   Dg Chest 2 View  01/04/2013   *RADIOLOGY REPORT*  Clinical Data: Cough and weakness.  History of non-Hodgkin's lymphoma.  CHEST - 2 VIEW  Comparison: 12/12/2012  Findings: There is stable positioning of a left Port-A-Cath with the tip at the origin of the SVC.  Lungs show stable scattered areas of parenchymal scarring bilaterally without evidence of pulmonary infiltrate or edema.  No nodules or effusions are identified.  Heart size and mediastinal contours are stable. Degenerative changes are present in the thoracic spine.  IMPRESSION: No active disease.   Original Report Authenticated By: Irish Lack, M.D.     1. Hyperglycemia   2. UTI (urinary tract infection)     Discharge Medication List as of 01/04/2013  9:16 PM    START taking these medications   Details  cephALEXin (KEFLEX) 500 MG capsule Take 1 capsule (500 mg total) by mouth 3 (three) times daily., Starting 01/04/2013, Until Discontinued, Print        Plan discharge   Devoria Albe, MD, FACEP   MDM   I personally performed the services  described in this documentation, which was scribed in my presence. The recorded information has been reviewed and considered.  Devoria Albe, MD, Armando Gang    Ward Givens, MD 01/04/13 [phone]

## 2013-01-04 NOTE — ED Notes (Signed)
Sent here by Dr. Jerelyn Scott - on chemo and dexamethasone and having uncontrolled CBG's.  C/o visual disturbances this morning. Denies pain .

## 2013-01-05 ENCOUNTER — Telehealth (HOSPITAL_COMMUNITY): Payer: Self-pay

## 2013-01-05 LAB — GLUCOSE, CAPILLARY

## 2013-01-05 NOTE — Telephone Encounter (Signed)
Call from Mr. Gavin, stated that [name] [name] was seen in ED and they were told to call and get an appointment to be seen in the [facility] regarding her elevated blood sugar.  Discussed with PA and recommended that patient see her PCP that manages her diabetes.  Husband stated they will get an appointment to be seen.

## 2013-01-06 LAB — URINE CULTURE
Colony Count: NO GROWTH
Culture: NO GROWTH

## 2013-01-09 LAB — CULTURE, BLOOD (SINGLE)

## 2013-01-16 ENCOUNTER — Ambulatory Visit (HOSPITAL_COMMUNITY): Payer: Medicare Other | Admitting: Oncology

## 2013-01-23 ENCOUNTER — Inpatient Hospital Stay (HOSPITAL_COMMUNITY): Payer: Medicare Other

## 2013-01-23 ENCOUNTER — Encounter (HOSPITAL_COMMUNITY): Payer: Self-pay | Admitting: Oncology

## 2013-01-23 ENCOUNTER — Encounter (HOSPITAL_COMMUNITY): Payer: Medicare Other | Attending: Oncology | Admitting: Oncology

## 2013-01-23 VITALS — BP 105/64 | HR 85 | Temp 97.0°F | Resp 16 | Wt 217.3 lb

## 2013-01-23 DIAGNOSIS — D509 Iron deficiency anemia, unspecified: Secondary | ICD-10-CM

## 2013-01-23 DIAGNOSIS — C8290 Follicular lymphoma, unspecified, unspecified site: Secondary | ICD-10-CM

## 2013-01-23 DIAGNOSIS — C8299 Follicular lymphoma, unspecified, extranodal and solid organ sites: Secondary | ICD-10-CM | POA: Insufficient documentation

## 2013-01-23 LAB — COMPREHENSIVE METABOLIC PANEL
Albumin: 2.9 g/dL — ABNORMAL LOW (ref 3.5–5.2)
Alkaline Phosphatase: 112 U/L (ref 39–117)
BUN: 40 mg/dL — ABNORMAL HIGH (ref 6–23)
Chloride: 94 mEq/L — ABNORMAL LOW (ref 96–112)
Creatinine, Ser: 2.67 mg/dL — ABNORMAL HIGH (ref 0.50–1.10)
GFR calc Af Amer: 19 mL/min — ABNORMAL LOW (ref >90)
GFR calc non Af Amer: 17 mL/min — ABNORMAL LOW (ref >90)
Glucose, Bld: 339 mg/dL — ABNORMAL HIGH (ref 70–99)
Potassium: 3.9 mEq/L (ref 3.5–5.1)
Total Bilirubin: 0.5 mg/dL (ref 0.3–1.2)

## 2013-01-23 LAB — CBC WITH DIFFERENTIAL/PLATELET
Basophils Relative: 0 % (ref 0–1)
HCT: 32.6 % — ABNORMAL LOW (ref 36.0–46.0)
Hemoglobin: 10.9 g/dL — ABNORMAL LOW (ref 12.0–15.0)
Lymphs Abs: 0.3 10*3/uL — ABNORMAL LOW (ref 0.7–4.0)
MCH: 28.8 pg (ref 26.0–34.0)
MCHC: 33.4 g/dL (ref 30.0–36.0)
Monocytes Absolute: 0.3 10*3/uL (ref 0.1–1.0)
Monocytes Relative: 9 % (ref 3–12)
Neutro Abs: 2.9 10*3/uL (ref 1.7–7.7)
RBC: 3.79 MIL/uL — ABNORMAL LOW (ref 3.87–5.11)

## 2013-01-23 MED ORDER — DEXAMETHASONE 4 MG PO TABS: ORAL_TABLET | ORAL | Status: DC

## 2013-01-23 NOTE — Patient Instructions (Addendum)
[facility] Discharge Instructions  RECOMMENDATIONS MADE BY THE CONSULTANT AND ANY TEST RESULTS WILL BE SENT TO YOUR REFERRING PHYSICIAN.  Decrease Dexamethasone to 4 mg daily for 2 days starting the day after chemotherapy. We will get you scheduled for a CT scan of brain. Continue chemotherapy as scheduled. Keep MD appointment in 4 weeks as already scheduled.  Thank you for choosing [facility] to provide your oncology and hematology care.  To afford each patient quality time with our providers, please arrive at least 15 minutes before your scheduled appointment time.  With your help, our goal is to use those 15 minutes to complete the necessary work-up to ensure our physicians have the information they need to help with your evaluation and healthcare recommendations.    Effective January 1st, 2014, we ask that you re-schedule your appointment with our physicians should you arrive 10 or more minutes late for your appointment.  We strive to give you quality time with our providers, and arriving late affects you and other patients whose appointments are after yours.    Again, thank you for choosing [facility].  Our hope is that these requests will decrease the amount of time that you wait before being seen by our physicians.       _____________________________________________________________  Should you have questions after your visit to [facility], please contact our office at [phone] between the hours of 8:30 a.m. and 5:00 p.m.  Voicemails left after 4:30 p.m. will not be returned until the following business day.  For prescription refill requests, have your pharmacy contact our office with your prescription refill request.

## 2013-01-23 NOTE — Progress Notes (Signed)
[name] Hector, MD [address] [place] [address]  Follicular lymphoma - Plan: phenazopyridine (PYRIDIUM) 200 MG tablet, dexamethasone (DECADRON) 4 MG tablet, CBC with Differential, Comprehensive metabolic panel, CT Head Wo Contrast, CBC with Differential, Comprehensive metabolic panel  Iron deficiency anemia, unspecified - Plan: Iron and TIBC, Ferritin  CURRENT THERAPY: S/P cycle 2 of Bendamustine D1 and D2 and Rituxan on D1 starting on 11/28/2012  INTERVAL HISTORY: [name] [name] 72 y.o. 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   The patient is tolerating chemotherapy well. She denies any nausea or vomiting or other side effects associated with bendamustine and Rituxan.  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 She is a booklet of her glucose over the past few days it appears as though her average approximately 250. I suspect she does not abide by a strict diabetic diet and this education was provided to her today.  She complains of fatigue and decrease in energy. This too I suspect is diabetes related although chemotherapy and/or her diagnosis of follicular lymphoma may play a part in this. Unfortunately from a cancer standpoint, but nothing we can do at this point time regarding her energy level.  She does admit that she spends more than 50% per day in a supine or reclining position at home. She is not very active at home.  I appreciate her change in mental status. As wonder if this is secondary to her diabetes and/or depression. She is noted to be tearful today during conversation. She answers approximate 90% of all questions correctly but has difficulty identifying the year  properly and her son's birthday. She is able to identify the [place] as president, the month, recent holiday, her birthday, etc.   With his change in mental status, will perform laboratory work today including a CBC differential, and complete that on panel.. I'll get her set up for a CT of brain in the very near future.  This imaging study will have to be performed without contrast due to her renal issues.  From a hematologic and oncologic standpoint, she is tolerating therapy well and ROS questioning is negative.  She was strongly urged him remain in close contact with her primary care physician who is managing her diabetes  Past Medical History  Diagnosis Date  . Hypertension   . Diabetes mellitus, type 2   . Hyperlipidemia   . 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 Iron deficiency anemia 11/21/2012    At time of presentation.  Feraheme 1020 mg on 11/21/12.    has Follicular lymphoma; Iron deficiency anemia; Chronic kidney disease (CKD), stage IV (severe); Diabetes mellitus type 2 in obese; and Hypertension on her problem list.     has No Known Allergies.  Ms. Dickenson had no medications administered during this visit.  Past Surgical History  Procedure Laterality Date  . Cataract extraction    . Cystoscopy w/ ureteral stent placement Right 11/03/2012    Procedure: CYSTOSCOPY WITH RETROGRADE PYELOGRAM/URETERAL STENT PLACEMENT;  Surgeon: Anner Crete, MD;  Location: [facility];  Service: Urology;  Laterality: Right;  . Portacath placement Left 11/03/2012    Procedure: INSERTION PORT-A-CATH;  Surgeon: Marlane Hatcher, MD;  Location: [facility];  Service: General;  Laterality: Left;  Insertion Port-A-Cath Left Subclavian    Denies any headaches, dizziness, double vision, fevers, chills, night sweats, nausea, vomiting, diarrhea, constipation, chest pain, heart palpitations, shortness of breath, blood in stool, black  tarry stool, urinary pain, urinary burning, urinary frequency, hematuria.   PHYSICAL EXAMINATION  ECOG PERFORMANCE STATUS: 3 - Symptomatic, >50% confined to bed  Filed Vitals:   01/23/13 1449  BP: 105/64  Pulse: 85  Temp: 97 F (36.1 C)  Resp: 16    GENERAL:alert, no distress, well nourished, well developed, comfortable, cooperative, obese, smiling and and tearful at times during conversation SKIN: skin color, texture, turgor are normal, no rashes or significant lesions HEAD: Normocephalic, No masses, lesions, tenderness or abnormalities EYES: normal, PERRLA, EOMI, Conjunctiva are pink and non-injected EARS: External ears normal OROPHARYNX:mucous membranes are moist  NECK: supple, no adenopathy, thyroid normal size, non-tender, without nodularity, no stridor, non-tender, trachea midline LYMPH:  no palpable lymphadenopathy BREAST:not examined LUNGS: clear to auscultation  HEART: regular rate & rhythm, no murmurs, no gallops, S1 normal and S2 normal ABDOMEN:abdomen soft, non-tender, obese and normal bowel sounds BACK: Back symmetric, no curvature. EXTREMITIES:less then 2 second capillary refill, no joint deformities, effusion, or inflammation, no edema, no skin discoloration, no clubbing, no cyanosis  NEURO: alert & oriented x 3 with fluent speech, the patient's affect is flattened and tearful at times during discussion. She is unable to identify today's year, or the birthday of her son. She does correctly identify the month of the year, her birthday, Fourth of July holiday, president of [place], etc.   LABORATORY DATA: CBC    Component Value Date/Time   WBC 27.0* 01/04/2013 1541   RBC 3.74* 01/04/2013 1541   HGB 10.6* 01/04/2013 1541   HCT 32.0* 01/04/2013 1541   PLT 259 01/04/2013 1541   MCV 85.6 01/04/2013 1541   MCH 28.3 01/04/2013 1541   MCHC 33.1 01/04/2013 1541   RDW 19.0* 01/04/2013 1541   LYMPHSABS 0.5* 01/04/2013 1541   MONOABS 1.1* 01/04/2013 1541   EOSABS 0.0  01/04/2013 1541   BASOSABS 0.0 01/04/2013 1541      Chemistry      Component Value Date/Time   NA 127* 01/04/2013 1541   K 4.6 01/04/2013 1541   CL 89* 01/04/2013 1541   CO2 22 01/04/2013 1541   BUN 51* 01/04/2013 1541   CREATININE 2.94* 01/04/2013 1541      Component Value Date/Time   CALCIUM 9.0 01/04/2013 1541   ALKPHOS 132* 01/02/2013 0846   AST 16 01/02/2013 0846   ALT 15 01/02/2013 0846   BILITOT 0.2* 01/02/2013 0846      Results for AHUVA, POYNOR (MRN [id number]) as of 01/23/2013 14:46  Ref. Range 01/02/2013 08:46  LDH Latest Range: 94-250 U/L 193     RADIOGRAPHIC STUDIES:  01/04/2013  *RADIOLOGY REPORT*  Clinical Data: Cough and weakness. History of non-Hodgkin's  lymphoma.  CHEST - 2 VIEW  Comparison: 12/12/2012  Findings: There is stable positioning of a left Port-A-Cath with  the tip at the origin of the SVC. Lungs show stable scattered  areas of parenchymal scarring bilaterally without evidence of  pulmonary infiltrate or edema. No nodules or effusions are  identified. Heart size and mediastinal contours are stable.  Degenerative changes are present in the thoracic spine.  IMPRESSION:  No active disease.  Original Report Authenticated By: Irish Lack, M.D.     ASSESSMENT:  1. 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  2. Hydronephrosis of right kidney secondary to #1. S/P right double-J stent placement by Dr. Annabell Howells on 11/03/2012  3. Admission to [facility] from 12/12/2012- 12/15/2012 for presumed Sepsis-like picture. This prompted the addition of Neulasta to her treatment plan beginning with cycle 2 of chemotherapy.  4. Poorly controlled DM, followed by PCP 5. Iron deficiency anemia at time of presentation. 6. Mental status changes, decreased mental acuity ?diabetes related?  Patient Active Problem List   Diagnosis Date Noted  . Chronic kidney disease (CKD),  stage IV (severe) 12/13/2012  . Diabetes mellitus type 2 in obese 12/13/2012  . Hypertension 12/13/2012  . Iron deficiency anemia 11/21/2012  . 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 I personally reviewed and went over laboratory results with the patient. 2. I personally reviewed and went over radiographic studies with the patient. 3. Pre-chemotherapy labs as ordered: CBC diff, CMET, LDH, ESR 4. Decrease decadron to 4 mg (instead of 8 mg) following chemotherapy for antiemetic.  5. Patient education regarding diabetic neuropathy 6. Labs today: CBC diff, CMET 7. CT brain without contrast this week or next week.  8. Financial counselor spoke with patient and husband about medical bills.  9. Restaging scans will be performed following completion of therapy. PET scan will be required due to poor renal function 10. Return in 4 weeks as scheduled for follow-up.    THERAPY PLAN:  From a cancer standpoint, she is tolerating therapy well. I suspect her biggest problem is her diabetes which is managed by her primary care physician. She was encouraged to remain in close contact with a primary care physician so he can alter her diabetic medications appropriately. We'll move on with cycle chemotherapy. We'll get her set up for a CT scan of head, but is very rare for follicular lymphoma to present itself in the brain. We'll see her back in 4 weeks as scheduled for followup.  At the completion of therapy, we'll restage her with a PET scan.  All questions were answered. The patient knows to call the clinic with any problems, questions or concerns. We can certainly see the patient much sooner if necessary.  Patient and plan discussed with Dr. Erline Hau and he is in agreement with the aforementioned.   Zameer Borman

## 2013-01-24 ENCOUNTER — Emergency Department (HOSPITAL_COMMUNITY)
Admission: EM | Admit: 2013-01-24 | Discharge: 2013-01-24 | Disposition: A | Discharge status: Home / Self Care | Payer: Medicare Other | Attending: Emergency Medicine | Admitting: Emergency Medicine

## 2013-01-24 ENCOUNTER — Encounter (HOSPITAL_COMMUNITY): Payer: Self-pay | Admitting: *Deleted

## 2013-01-24 ENCOUNTER — Inpatient Hospital Stay (HOSPITAL_COMMUNITY): Payer: Medicare Other

## 2013-01-24 ENCOUNTER — Telehealth (HOSPITAL_COMMUNITY): Payer: Self-pay

## 2013-01-24 DIAGNOSIS — E785 Hyperlipidemia, unspecified: Secondary | ICD-10-CM | POA: Insufficient documentation

## 2013-01-24 DIAGNOSIS — R110 Nausea: Secondary | ICD-10-CM | POA: Insufficient documentation

## 2013-01-24 DIAGNOSIS — E860 Dehydration: Secondary | ICD-10-CM | POA: Insufficient documentation

## 2013-01-24 DIAGNOSIS — R5381 Other malaise: Secondary | ICD-10-CM | POA: Insufficient documentation

## 2013-01-24 DIAGNOSIS — E871 Hypo-osmolality and hyponatremia: Secondary | ICD-10-CM | POA: Insufficient documentation

## 2013-01-24 DIAGNOSIS — R3989 Other symptoms and signs involving the genitourinary system: Secondary | ICD-10-CM | POA: Insufficient documentation

## 2013-01-24 DIAGNOSIS — Z9221 Personal history of antineoplastic chemotherapy: Secondary | ICD-10-CM | POA: Insufficient documentation

## 2013-01-24 DIAGNOSIS — R634 Abnormal weight loss: Secondary | ICD-10-CM | POA: Insufficient documentation

## 2013-01-24 DIAGNOSIS — R5383 Other fatigue: Secondary | ICD-10-CM | POA: Insufficient documentation

## 2013-01-24 DIAGNOSIS — R630 Anorexia: Secondary | ICD-10-CM

## 2013-01-24 DIAGNOSIS — Z862 Personal history of diseases of the blood and blood-forming organs and certain disorders involving the immune mechanism: Secondary | ICD-10-CM | POA: Insufficient documentation

## 2013-01-24 DIAGNOSIS — E1169 Type 2 diabetes mellitus with other specified complication: Secondary | ICD-10-CM | POA: Insufficient documentation

## 2013-01-24 DIAGNOSIS — Z79899 Other long term (current) drug therapy: Secondary | ICD-10-CM | POA: Insufficient documentation

## 2013-01-24 DIAGNOSIS — I10 Essential (primary) hypertension: Secondary | ICD-10-CM | POA: Insufficient documentation

## 2013-01-24 DIAGNOSIS — Z87898 Personal history of other specified conditions: Secondary | ICD-10-CM | POA: Insufficient documentation

## 2013-01-24 DIAGNOSIS — Z794 Long term (current) use of insulin: Secondary | ICD-10-CM | POA: Insufficient documentation

## 2013-01-24 DIAGNOSIS — R739 Hyperglycemia, unspecified: Secondary | ICD-10-CM

## 2013-01-24 LAB — IRON AND TIBC
Iron: 34 ug/dL — ABNORMAL LOW (ref 42–135)
TIBC: 222 ug/dL — ABNORMAL LOW (ref 250–470)

## 2013-01-24 LAB — BASIC METABOLIC PANEL
CO2: 25 mEq/L (ref 19–32)
Calcium: 9.5 mg/dL (ref 8.4–10.5)
Chloride: 97 mEq/L (ref 96–112)
Creatinine, Ser: 2.66 mg/dL — ABNORMAL HIGH (ref 0.50–1.10)
Glucose, Bld: 196 mg/dL — ABNORMAL HIGH (ref 70–99)

## 2013-01-24 LAB — CBC
HCT: 32 % — ABNORMAL LOW (ref 36.0–46.0)
Hemoglobin: 10.5 g/dL — ABNORMAL LOW (ref 12.0–15.0)
MCH: 28.6 pg (ref 26.0–34.0)
MCV: 87.2 fL (ref 78.0–100.0)
Platelets: 164 10*3/uL (ref 150–400)
RBC: 3.67 MIL/uL — ABNORMAL LOW (ref 3.87–5.11)
WBC: 4 10*3/uL (ref 4.0–10.5)

## 2013-01-24 LAB — FERRITIN: Ferritin: 2178 ng/mL — ABNORMAL HIGH (ref 10–291)

## 2013-01-24 MED ORDER — SODIUM CHLORIDE 0.9 % IV BOLUS (SEPSIS)
1000.0000 mL | Freq: Once | INTRAVENOUS | Status: AC
  Administered 2013-01-24: 1000 mL via INTRAVENOUS

## 2013-01-24 NOTE — ED Provider Notes (Signed)
History  This chart was scribed for Ward Givens, MD by Bennett Scrape, ED Scribe. This patient was seen in room APA02/APA02 and the patient's care was started at 2:17 PM.  CSN: [id number] Arrival date & time 01/24/13  1256  None    Chief Complaint  Patient presents with  . Abnormal Lab    Patient is a 72 y.o. female presenting with weakness. The history is provided by the patient. No language interpreter was used.  Weakness This is a recurrent problem. The current episode started more than 1 week ago. The problem occurs constantly. The problem has not changed since onset.Pertinent negatives include no abdominal pain. Nothing aggravates the symptoms. Nothing relieves the symptoms. She has tried nothing for the symptoms.    HPI Comments: [name] [name] is a 72 y.o. female who presents to the Emergency Department complaining of 2 months of constant generalized weakness with associated nausea since starting chemo treatments for lymphoma. Last treatment was 3 weeks ago. She had routine blood work drawn yesterday that showed hyperglycemia (339) and hyponatremia (128). She was told to come to the ED for evaluation. Her CBG around 8 AM this morning was in the 200s. Husband states that CBGs in the 200s have been normal since she started chemotherapy. She reports that she is urinating less than normal and has lost 10 lbs but husband reports that she has had a decreased appetite since starting the chemo. Pt denies lightheadedness, emesis, diarrhea and constipation as associated symptoms.   Pt denies smoking and alcohol use.  PCP is Dr. Lysbeth Galas   Past Medical History  Diagnosis Date  . Hypertension   . Diabetes mellitus, type 2   . Hyperlipidemia   . 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 Iron deficiency anemia 11/21/2012    At time of presentation.  Feraheme 1020 mg on 11/21/12.   Past Surgical History  Procedure Laterality Date   . Cataract extraction    . Cystoscopy w/ ureteral stent placement Right 11/03/2012    Procedure: CYSTOSCOPY WITH RETROGRADE PYELOGRAM/URETERAL STENT PLACEMENT;  Surgeon: Anner Crete, MD;  Location: [facility];  Service: Urology;  Laterality: Right;  . Portacath placement Left 11/03/2012    Procedure: INSERTION PORT-A-CATH;  Surgeon: Marlane Hatcher, MD;  Location: [facility];  Service: General;  Laterality: Left;  Insertion Port-A-Cath Left Subclavian   Family History  Problem Relation Age of Onset  . Cancer Father   . Cancer Brother    History  Substance Use Topics  . Smoking status: Never Smoker   . Smokeless tobacco: Never Used  . Alcohol Use: No  lives at home Lives with spouse   No OB history provided.   Review of Systems  Constitutional: Negative for fever.  Gastrointestinal: Positive for nausea. Negative for vomiting, abdominal pain and diarrhea.  Genitourinary: Positive for decreased urine volume. Negative for difficulty urinating.  Neurological: Positive for weakness. Negative for light-headedness.  All other systems reviewed and are negative.    Allergies  Review of patient's allergies indicates no known allergies.  Home Medications   Current Outpatient Rx  Name  Route  Sig  Dispense  Refill  . acetaminophen (TYLENOL ARTHRITIS PAIN) 650 MG CR tablet   Oral   Take 650 mg by mouth every 8 (eight) hours as needed for pain.         Marland Kitchen acyclovir (ZOVIRAX) 400 MG tablet   Oral   Take 1 tablet (400  mg total) by mouth daily.   30 tablet   3   . allopurinol (ZYLOPRIM) 300 MG tablet   Oral   Take 1 tablet (300 mg total) by mouth daily.   30 tablet   3   . atorvastatin (LIPITOR) 80 MG tablet   Oral   Take 80 mg by mouth every morning.         Marland Kitchen dexamethasone (DECADRON) 4 MG tablet      Take daily starting the day after chemotherapy for 2 days. Take with food.   30 tablet   1   . docusate sodium (COLACE) 100 MG capsule   Oral   Take 100-200 mg by mouth  daily.         . furosemide (LASIX) 20 MG tablet   Oral   Take 20 mg by mouth 2 (two) times daily.          Marland Kitchen glipiZIDE (GLUCOTROL) 10 MG tablet   Oral   Take 10 mg by mouth 2 (two) times daily before a meal.         . insulin detemir (LEVEMIR) 100 UNIT/ML injection   Subcutaneous   Inject 40 Units into the skin 2 (two) times daily.         Marland Kitchen lidocaine-prilocaine (EMLA) cream   Topical   Apply 1 application topically as needed. 1 hour prior to Chemo         . LORazepam (ATIVAN) 0.5 MG tablet   Oral   Take 0.5 mg by mouth every 4 (four) hours as needed (nausea).          . losartan (COZAAR) 100 MG tablet   Oral   Take 100 mg by mouth every morning.         . Multiple Vitamin (MULTIVITAMIN WITH MINERALS) TABS   Oral   Take 1 tablet by mouth daily.         . ondansetron (ZOFRAN) 8 MG tablet   Oral   Take 1 tablet (8 mg total) by mouth 2 (two) times daily. Take two times a day starting the day after chemo for 2 days. Then take two times a day as needed for nausea or vomiting.   30 tablet   1   . phenazopyridine (PYRIDIUM) 200 MG tablet   Oral   Take 200 mg by mouth 3 (three) times daily as needed for pain.         Marland Kitchen sulfamethoxazole-trimethoprim (BACTRIM DS,SEPTRA DS) 800-160 MG per tablet      Take one tablet on Monday-Wednesday-and Friday for 6 months   30 tablet   2    Triage Vitals: BP 129/54  Pulse 89  Temp(Src) 97.7 F (36.5 C) (Oral)  Resp 17  Ht 5\' 5"  (1.651 m)  Wt 217 lb (98.431 kg)  BMI 36.11 kg/m2  SpO2 99%  Vital signs normal    Physical Exam  Nursing note and vitals reviewed. Constitutional: She is oriented to person, place, and time. She appears well-developed and well-nourished.  Non-toxic appearance. She does not appear ill. No distress.  HENT:  Head: Normocephalic and atraumatic.  Right Ear: External ear normal.  Left Ear: External ear normal.  Nose: Nose normal. No mucosal edema or rhinorrhea.  Mouth/Throat: Mucous  membranes are normal. No dental abscesses or edematous.  Dry mucus membranes  Eyes: Conjunctivae and EOM are normal. Pupils are equal, round, and reactive to light.  Neck: Normal range of motion and full passive range of motion without  pain. Neck supple.  Cardiovascular: Normal rate, regular rhythm and normal heart sounds.  Exam reveals no gallop and no friction rub.   No murmur heard. Pulmonary/Chest: Effort normal and breath sounds normal. No respiratory distress. She has no wheezes. She has no rhonchi. She has no rales. She exhibits no tenderness and no crepitus.  Abdominal: Soft. Normal appearance and bowel sounds are normal. She exhibits no distension. There is no tenderness. There is no rebound and no guarding.  Musculoskeletal: Normal range of motion. She exhibits no edema and no tenderness.  Moves all extremities well.   Neurological: She is alert and oriented to person, place, and time. She has normal strength. No cranial nerve deficit.  Skin: Skin is warm, dry and intact. No rash noted. No erythema. No pallor.  Psychiatric: She has a normal mood and affect. Her speech is normal and behavior is normal. Her mood appears not anxious.    ED Course  Procedures (including critical care time)  Medications  sodium chloride 0.9 % bolus 1,000 mL (1,000 mLs Intravenous New Bag/Given 01/24/13 1445)    DIAGNOSTIC STUDIES: Oxygen Saturation is 99% on room air, normal by my interpretation.    COORDINATION OF CARE: 2:22 PM-Discussed treatment plan which includes IV fluids with pt at bedside and pt agreed to plan. Advised husband that he can give the pt boost or glucerna to help with the weight loss.  3:58 PM-Pt rechecked and feels improved with fluids listed above. Pt will be discharged after finishing IV fluids.  16:45 IV bolus almost in, is starting to have urine output, does not feel like she needs a second liter.   1. Dehydration   2. Hyponatremia   3. Hyperglycemia without ketosis    4. Loss of appetite   5. Weight loss      Plan discharge   Devoria Albe, MD, FACEP    MDM patient given a liter of normal saline which will hopefully improve her symptoms of dehydration. We also discussed other things she can do while she is getting her chemotherapy.    I personally performed the services described in this documentation, which was scribed in my presence. The recorded information has been reviewed and considered.  Devoria Albe, MD, Armando Gang    Ward Givens, MD 01/24/13 [phone]

## 2013-01-24 NOTE — Telephone Encounter (Signed)
Per husband, [name] [name] is not feeling well this morning.  Informed results of sodium and blood sugar and instructed that the abnormalities are diabetes related and that she either needs to see her PCP Dr. Lysbeth Galas or go to the ED for assistance today.  Husband verbalized understanding of instructions.

## 2013-01-24 NOTE — Telephone Encounter (Signed)
Message copied by Evelena Leyden on Wed Jan 24, 2013 10:09 AM ------      Message from: Ellouise Newer      Created: Wed Jan 24, 2013  9:55 AM       Call patient.  How is she doing?  If no better, she needs to report to ED or call her PCP today.  I have faxed her lab work to PCP.  She has some lab abnormalities that are diabetes related and these need to be addressed soon.  ------

## 2013-01-24 NOTE — ED Notes (Signed)
Pt states she had blood work obtained yesterday and was called stating that glucose was up and sodium was low. Pt states generalized weakness. AD.

## 2013-01-30 ENCOUNTER — Encounter (HOSPITAL_BASED_OUTPATIENT_CLINIC_OR_DEPARTMENT_OTHER): Payer: Medicare Other

## 2013-01-30 VITALS — BP 146/50 | HR 91 | Temp 98.5°F | Resp 18 | Wt 218.6 lb

## 2013-01-30 DIAGNOSIS — Z5112 Encounter for antineoplastic immunotherapy: Secondary | ICD-10-CM

## 2013-01-30 DIAGNOSIS — C8299 Follicular lymphoma, unspecified, extranodal and solid organ sites: Secondary | ICD-10-CM

## 2013-01-30 DIAGNOSIS — C8290 Follicular lymphoma, unspecified, unspecified site: Secondary | ICD-10-CM

## 2013-01-30 LAB — CBC WITH DIFFERENTIAL/PLATELET
Basophils Absolute: 0 10*3/uL (ref 0.0–0.1)
HCT: 29.5 % — ABNORMAL LOW (ref 36.0–46.0)
Hemoglobin: 9.8 g/dL — ABNORMAL LOW (ref 12.0–15.0)
Lymphocytes Relative: 18 % (ref 12–46)
Lymphs Abs: 0.8 10*3/uL (ref 0.7–4.0)
Monocytes Absolute: 0.4 10*3/uL (ref 0.1–1.0)
Monocytes Relative: 10 % (ref 3–12)
Neutro Abs: 2.9 10*3/uL (ref 1.7–7.7)
RBC: 3.32 MIL/uL — ABNORMAL LOW (ref 3.87–5.11)
RDW: 21.6 % — ABNORMAL HIGH (ref 11.5–15.5)
WBC: 4.3 10*3/uL (ref 4.0–10.5)

## 2013-01-30 LAB — COMPREHENSIVE METABOLIC PANEL
AST: 21 U/L (ref 0–37)
CO2: 25 mEq/L (ref 19–32)
Chloride: 98 mEq/L (ref 96–112)
Creatinine, Ser: 2.19 mg/dL — ABNORMAL HIGH (ref 0.50–1.10)
GFR calc non Af Amer: 21 mL/min — ABNORMAL LOW (ref >90)
Glucose, Bld: 305 mg/dL — ABNORMAL HIGH (ref 70–99)
Total Bilirubin: 0.3 mg/dL (ref 0.3–1.2)

## 2013-01-30 LAB — SEDIMENTATION RATE: Sed Rate: 60 mm/hr — ABNORMAL HIGH (ref 0–22)

## 2013-01-30 MED ORDER — RITUXIMAB CHEMO INJECTION 10 MG/ML
375.0000 mg/m2 | Freq: Once | INTRAVENOUS | Status: AC
  Administered 2013-01-30: 800 mg via INTRAVENOUS
  Filled 2013-01-30: qty 80

## 2013-01-30 MED ORDER — HEPARIN SOD (PORK) LOCK FLUSH 100 UNIT/ML IV SOLN
500.0000 [IU] | Freq: Once | INTRAVENOUS | Status: AC | PRN
  Administered 2013-01-30: 500 [IU]
  Filled 2013-01-30: qty 5

## 2013-01-30 MED ORDER — HEPARIN SOD (PORK) LOCK FLUSH 100 UNIT/ML IV SOLN
INTRAVENOUS | Status: AC
  Filled 2013-01-30: qty 5

## 2013-01-30 MED ORDER — DIPHENHYDRAMINE HCL 25 MG PO CAPS
50.0000 mg | ORAL_CAPSULE | Freq: Once | ORAL | Status: AC
  Administered 2013-01-30: 50 mg via ORAL

## 2013-01-30 MED ORDER — SODIUM CHLORIDE 0.9 % IV SOLN
90.0000 mg/m2 | Freq: Once | INTRAVENOUS | Status: AC
  Administered 2013-01-30: 200 mg via INTRAVENOUS
  Filled 2013-01-30: qty 40

## 2013-01-30 MED ORDER — ACETAMINOPHEN 325 MG PO TABS
650.0000 mg | ORAL_TABLET | Freq: Once | ORAL | Status: AC
  Administered 2013-01-30: 650 mg via ORAL

## 2013-01-30 MED ORDER — ACETAMINOPHEN 325 MG PO TABS
ORAL_TABLET | ORAL | Status: AC
  Filled 2013-01-30: qty 2

## 2013-01-30 MED ORDER — DIPHENHYDRAMINE HCL 25 MG PO CAPS
ORAL_CAPSULE | ORAL | Status: AC
  Filled 2013-01-30: qty 2

## 2013-01-30 MED ORDER — SODIUM CHLORIDE 0.9 % IV SOLN
Freq: Once | INTRAVENOUS | Status: AC
  Administered 2013-01-30: 09:00:00 via INTRAVENOUS

## 2013-01-30 MED ORDER — SODIUM CHLORIDE 0.9 % IV SOLN
Freq: Once | INTRAVENOUS | Status: AC
  Administered 2013-01-30: 8 mg via INTRAVENOUS
  Filled 2013-01-30: qty 4

## 2013-01-31 ENCOUNTER — Encounter (HOSPITAL_BASED_OUTPATIENT_CLINIC_OR_DEPARTMENT_OTHER): Payer: Medicare Other

## 2013-01-31 VITALS — BP 156/66 | HR 76 | Temp 97.6°F | Resp 20

## 2013-01-31 DIAGNOSIS — C8290 Follicular lymphoma, unspecified, unspecified site: Secondary | ICD-10-CM

## 2013-01-31 DIAGNOSIS — Z5111 Encounter for antineoplastic chemotherapy: Secondary | ICD-10-CM

## 2013-01-31 DIAGNOSIS — C8299 Follicular lymphoma, unspecified, extranodal and solid organ sites: Secondary | ICD-10-CM

## 2013-01-31 MED ORDER — SODIUM CHLORIDE 0.9 % IV SOLN
90.0000 mg/m2 | Freq: Once | INTRAVENOUS | Status: AC
  Administered 2013-01-31: 200 mg via INTRAVENOUS
  Filled 2013-01-31: qty 40

## 2013-01-31 MED ORDER — HEPARIN SOD (PORK) LOCK FLUSH 100 UNIT/ML IV SOLN
INTRAVENOUS | Status: AC
  Filled 2013-01-31: qty 5

## 2013-01-31 MED ORDER — SODIUM CHLORIDE 0.9 % IJ SOLN
10.0000 mL | INTRAMUSCULAR | Status: DC | PRN
  Administered 2013-01-31: 10 mL
  Filled 2013-01-31: qty 10

## 2013-01-31 MED ORDER — HEPARIN SOD (PORK) LOCK FLUSH 100 UNIT/ML IV SOLN
500.0000 [IU] | Freq: Once | INTRAVENOUS | Status: AC | PRN
  Administered 2013-01-31: 500 [IU]
  Filled 2013-01-31: qty 5

## 2013-01-31 MED ORDER — SODIUM CHLORIDE 0.9 % IV SOLN
Freq: Once | INTRAVENOUS | Status: AC
  Administered 2013-01-31: 10:00:00 via INTRAVENOUS

## 2013-01-31 MED ORDER — SODIUM CHLORIDE 0.9 % IV SOLN
Freq: Once | INTRAVENOUS | Status: AC
  Administered 2013-01-31: 8 mg via INTRAVENOUS
  Filled 2013-01-31: qty 4

## 2013-02-01 ENCOUNTER — Ambulatory Visit (HOSPITAL_COMMUNITY)
Admission: RE | Admit: 2013-02-01 | Discharge: 2013-02-01 | Disposition: A | Discharge status: Home / Self Care | Payer: Medicare Other | Source: Ambulatory Visit | Attending: Oncology | Admitting: Oncology

## 2013-02-01 ENCOUNTER — Encounter (HOSPITAL_BASED_OUTPATIENT_CLINIC_OR_DEPARTMENT_OTHER): Payer: Medicare Other

## 2013-02-01 VITALS — BP 110/58 | HR 74 | Temp 98.0°F | Resp 16

## 2013-02-01 DIAGNOSIS — R4182 Altered mental status, unspecified: Secondary | ICD-10-CM | POA: Insufficient documentation

## 2013-02-01 DIAGNOSIS — C8299 Follicular lymphoma, unspecified, extranodal and solid organ sites: Secondary | ICD-10-CM | POA: Insufficient documentation

## 2013-02-01 DIAGNOSIS — Z5189 Encounter for other specified aftercare: Secondary | ICD-10-CM

## 2013-02-01 DIAGNOSIS — C8290 Follicular lymphoma, unspecified, unspecified site: Secondary | ICD-10-CM

## 2013-02-01 MED ORDER — PEGFILGRASTIM INJECTION 6 MG/0.6ML
6.0000 mg | Freq: Once | SUBCUTANEOUS | Status: AC
  Administered 2013-02-01: 6 mg via SUBCUTANEOUS

## 2013-02-01 MED ORDER — PEGFILGRASTIM INJECTION 6 MG/0.6ML
SUBCUTANEOUS | Status: AC
  Filled 2013-02-01: qty 0.6

## 2013-02-01 NOTE — Progress Notes (Signed)
[name] [name] presents today for injection per MD orders. Neulasta 6mg  administered SQ in right Abdomen. Administration without incident. Patient tolerated well.

## 2013-02-07 ENCOUNTER — Emergency Department (HOSPITAL_COMMUNITY): Payer: Medicare Other

## 2013-02-07 ENCOUNTER — Encounter (HOSPITAL_COMMUNITY): Payer: Self-pay | Admitting: Emergency Medicine

## 2013-02-07 ENCOUNTER — Inpatient Hospital Stay (HOSPITAL_COMMUNITY): Payer: Medicare Other

## 2013-02-07 ENCOUNTER — Inpatient Hospital Stay (HOSPITAL_COMMUNITY)
Admission: EM | Admit: 2013-02-07 | Discharge: 2013-02-08 | DRG: 690 | Disposition: A | Discharge status: Home / Self Care | Payer: Medicare Other | Attending: Internal Medicine | Admitting: Internal Medicine

## 2013-02-07 DIAGNOSIS — N390 Urinary tract infection, site not specified: Principal | ICD-10-CM | POA: Diagnosis present

## 2013-02-07 DIAGNOSIS — I129 Hypertensive chronic kidney disease with stage 1 through stage 4 chronic kidney disease, or unspecified chronic kidney disease: Secondary | ICD-10-CM | POA: Diagnosis present

## 2013-02-07 DIAGNOSIS — Z794 Long term (current) use of insulin: Secondary | ICD-10-CM

## 2013-02-07 DIAGNOSIS — E1169 Type 2 diabetes mellitus with other specified complication: Secondary | ICD-10-CM | POA: Diagnosis present

## 2013-02-07 DIAGNOSIS — D72829 Elevated white blood cell count, unspecified: Secondary | ICD-10-CM | POA: Diagnosis present

## 2013-02-07 DIAGNOSIS — Z9221 Personal history of antineoplastic chemotherapy: Secondary | ICD-10-CM

## 2013-02-07 DIAGNOSIS — N12 Tubulo-interstitial nephritis, not specified as acute or chronic: Secondary | ICD-10-CM

## 2013-02-07 DIAGNOSIS — E785 Hyperlipidemia, unspecified: Secondary | ICD-10-CM | POA: Diagnosis present

## 2013-02-07 DIAGNOSIS — Z87440 Personal history of urinary (tract) infections: Secondary | ICD-10-CM

## 2013-02-07 DIAGNOSIS — N184 Chronic kidney disease, stage 4 (severe): Secondary | ICD-10-CM | POA: Diagnosis present

## 2013-02-07 DIAGNOSIS — Z79899 Other long term (current) drug therapy: Secondary | ICD-10-CM

## 2013-02-07 DIAGNOSIS — N1330 Unspecified hydronephrosis: Secondary | ICD-10-CM | POA: Diagnosis present

## 2013-02-07 DIAGNOSIS — I10 Essential (primary) hypertension: Secondary | ICD-10-CM | POA: Diagnosis present

## 2013-02-07 DIAGNOSIS — T451X5A Adverse effect of antineoplastic and immunosuppressive drugs, initial encounter: Secondary | ICD-10-CM

## 2013-02-07 DIAGNOSIS — C8299 Follicular lymphoma, unspecified, extranodal and solid organ sites: Secondary | ICD-10-CM | POA: Diagnosis present

## 2013-02-07 DIAGNOSIS — E669 Obesity, unspecified: Secondary | ICD-10-CM

## 2013-02-07 DIAGNOSIS — D509 Iron deficiency anemia, unspecified: Secondary | ICD-10-CM | POA: Diagnosis present

## 2013-02-07 DIAGNOSIS — C8290 Follicular lymphoma, unspecified, unspecified site: Secondary | ICD-10-CM

## 2013-02-07 DIAGNOSIS — K5900 Constipation, unspecified: Secondary | ICD-10-CM | POA: Diagnosis present

## 2013-02-07 DIAGNOSIS — C8236 Follicular lymphoma grade IIIa, intrapelvic lymph nodes: Secondary | ICD-10-CM | POA: Diagnosis present

## 2013-02-07 DIAGNOSIS — E119 Type 2 diabetes mellitus without complications: Secondary | ICD-10-CM

## 2013-02-07 DIAGNOSIS — R748 Abnormal levels of other serum enzymes: Secondary | ICD-10-CM | POA: Diagnosis present

## 2013-02-07 DIAGNOSIS — E162 Hypoglycemia, unspecified: Secondary | ICD-10-CM | POA: Diagnosis present

## 2013-02-07 LAB — CBC WITH DIFFERENTIAL/PLATELET
Eosinophils Relative: 0 % (ref 0–5)
HCT: 29.9 % — ABNORMAL LOW (ref 36.0–46.0)
Lymphocytes Relative: 9 % — ABNORMAL LOW (ref 12–46)
Lymphs Abs: 1.2 10*3/uL (ref 0.7–4.0)
MCV: 91.7 fL (ref 78.0–100.0)
Monocytes Absolute: 1 10*3/uL (ref 0.1–1.0)
RBC: 3.26 MIL/uL — ABNORMAL LOW (ref 3.87–5.11)
RDW: 23.4 % — ABNORMAL HIGH (ref 11.5–15.5)
WBC: 14.5 10*3/uL — ABNORMAL HIGH (ref 4.0–10.5)

## 2013-02-07 LAB — LIPASE, BLOOD: Lipase: 139 U/L — ABNORMAL HIGH (ref 11–59)

## 2013-02-07 LAB — TSH: TSH: 2.836 u[IU]/mL (ref 0.350–4.500)

## 2013-02-07 LAB — COMPREHENSIVE METABOLIC PANEL
BUN: 35 mg/dL — ABNORMAL HIGH (ref 6–23)
CO2: 25 mEq/L (ref 19–32)
Chloride: 98 mEq/L (ref 96–112)
Creatinine, Ser: 2.54 mg/dL — ABNORMAL HIGH (ref 0.50–1.10)
GFR calc non Af Amer: 18 mL/min — ABNORMAL LOW (ref >90)
Glucose, Bld: 216 mg/dL — ABNORMAL HIGH (ref 70–99)
Total Bilirubin: 0.3 mg/dL (ref 0.3–1.2)

## 2013-02-07 LAB — URINALYSIS, ROUTINE W REFLEX MICROSCOPIC
Ketones, ur: NEGATIVE mg/dL
Nitrite: NEGATIVE
Protein, ur: 100 mg/dL — AB

## 2013-02-07 LAB — URINE MICROSCOPIC-ADD ON

## 2013-02-07 LAB — GLUCOSE, CAPILLARY: Glucose-Capillary: 176 mg/dL — ABNORMAL HIGH (ref 70–99)

## 2013-02-07 MED ORDER — DEXTROSE 5 % IV SOLN
1.0000 g | INTRAVENOUS | Status: DC
  Administered 2013-02-07: 1 g via INTRAVENOUS
  Filled 2013-02-07 (×2): qty 10

## 2013-02-07 MED ORDER — LORAZEPAM 0.5 MG PO TABS
0.5000 mg | ORAL_TABLET | ORAL | Status: DC | PRN
  Administered 2013-02-08: 0.5 mg via ORAL
  Filled 2013-02-07: qty 1

## 2013-02-07 MED ORDER — ACYCLOVIR 400 MG PO TABS
400.0000 mg | ORAL_TABLET | Freq: Every day | ORAL | Status: DC
  Filled 2013-02-07 (×2): qty 1

## 2013-02-07 MED ORDER — MILK AND MOLASSES ENEMA: Freq: Once | RECTAL | Status: DC

## 2013-02-07 MED ORDER — SODIUM CHLORIDE 0.9 % IV BOLUS (SEPSIS)
1000.0000 mL | Freq: Once | INTRAVENOUS | Status: AC
  Administered 2013-02-07: 1000 mL via INTRAVENOUS

## 2013-02-07 MED ORDER — FUROSEMIDE 20 MG PO TABS
20.0000 mg | ORAL_TABLET | Freq: Two times a day (BID) | ORAL | Status: DC
  Administered 2013-02-08: 20 mg via ORAL
  Filled 2013-02-07: qty 1

## 2013-02-07 MED ORDER — SODIUM CHLORIDE 0.9 % IJ SOLN: 10.0000 mL | INTRAMUSCULAR | Status: DC | PRN

## 2013-02-07 MED ORDER — INSULIN DETEMIR 100 UNIT/ML ~~LOC~~ SOLN
SUBCUTANEOUS | Status: AC
  Filled 2013-02-07: qty 1

## 2013-02-07 MED ORDER — HEPARIN SOD (PORK) LOCK FLUSH 100 UNIT/ML IV SOLN: 250.0000 [IU] | INTRAVENOUS | Status: DC | PRN

## 2013-02-07 MED ORDER — ACETAMINOPHEN 325 MG PO TABS: 650.0000 mg | ORAL_TABLET | Freq: Four times a day (QID) | ORAL | Status: DC | PRN

## 2013-02-07 MED ORDER — ACETAMINOPHEN 650 MG RE SUPP: 650.0000 mg | Freq: Four times a day (QID) | RECTAL | Status: DC | PRN

## 2013-02-07 MED ORDER — DEXTROSE 5 % IV SOLN
1.0000 g | Freq: Once | INTRAVENOUS | Status: AC
  Administered 2013-02-07: 1 g via INTRAVENOUS
  Filled 2013-02-07: qty 10

## 2013-02-07 MED ORDER — ONDANSETRON HCL 4 MG PO TABS: 4.0000 mg | ORAL_TABLET | Freq: Four times a day (QID) | ORAL | Status: DC | PRN

## 2013-02-07 MED ORDER — ATORVASTATIN CALCIUM 40 MG PO TABS
80.0000 mg | ORAL_TABLET | Freq: Every morning | ORAL | Status: DC
  Administered 2013-02-08: 80 mg via ORAL
  Filled 2013-02-07: qty 2

## 2013-02-07 MED ORDER — INSULIN ASPART 100 UNIT/ML ~~LOC~~ SOLN
0.0000 [IU] | Freq: Three times a day (TID) | SUBCUTANEOUS | Status: DC
  Administered 2013-02-08: 3 [IU] via SUBCUTANEOUS

## 2013-02-07 MED ORDER — SODIUM CHLORIDE 0.9 % IJ SOLN: 3.0000 mL | INTRAMUSCULAR | Status: DC | PRN

## 2013-02-07 MED ORDER — SODIUM CHLORIDE 0.9 % IJ SOLN
3.0000 mL | Freq: Two times a day (BID) | INTRAMUSCULAR | Status: DC
  Administered 2013-02-08: 3 mL via INTRAVENOUS

## 2013-02-07 MED ORDER — INSULIN DETEMIR 100 UNIT/ML ~~LOC~~ SOLN
35.0000 [IU] | Freq: Two times a day (BID) | SUBCUTANEOUS | Status: DC
  Administered 2013-02-07 – 2013-02-08 (×2): 35 [IU] via SUBCUTANEOUS
  Filled 2013-02-07 (×4): qty 0.35

## 2013-02-07 MED ORDER — DEXTROSE 5 % IV SOLN
INTRAVENOUS | Status: AC
  Filled 2013-02-07: qty 10

## 2013-02-07 MED ORDER — SODIUM CHLORIDE 0.9 % IV SOLN
INTRAVENOUS | Status: AC
  Administered 2013-02-07: 18:00:00 via INTRAVENOUS

## 2013-02-07 MED ORDER — ONDANSETRON HCL 4 MG/2ML IJ SOLN: 4.0000 mg | Freq: Four times a day (QID) | INTRAMUSCULAR | Status: DC | PRN

## 2013-02-07 MED ORDER — ENOXAPARIN SODIUM 40 MG/0.4ML ~~LOC~~ SOLN: 40.0000 mg | SUBCUTANEOUS | Status: DC

## 2013-02-07 MED ORDER — ALLOPURINOL 300 MG PO TABS
300.0000 mg | ORAL_TABLET | Freq: Every day | ORAL | Status: DC
  Administered 2013-02-08: 300 mg via ORAL
  Filled 2013-02-07: qty 1

## 2013-02-07 NOTE — ED Notes (Signed)
Pt c/o constipation x 1 week and low blood sugars x 2 days. cbg-174 upon arrival. Pt also c/o some nausea and weakness x 1 week.

## 2013-02-07 NOTE — ED Notes (Signed)
Report given to [facility]. Ready to receive patient.

## 2013-02-07 NOTE — ED Provider Notes (Addendum)
CSN: [id number]     Arrival date & time 02/07/13  1121 History     First MD Initiated Contact with Patient 02/07/13 1202     Chief Complaint  Patient presents with  . Hypoglycemia  . Constipation   (Consider location/radiation/quality/duration/timing/severity/associated sxs/prior Treatment) Patient is a 72 y.o. female presenting with constipation. The history is provided by the patient.  Constipation Time since last bowel movement:  1 week Timing:  Constant Progression:  Unchanged Chronicity:  New Stool description:  Small Relieved by:  Nothing Ineffective treatments:  Stool softeners and enemas Associated symptoms: abdominal pain and dysuria   Associated symptoms: no fever and no vomiting     Past Medical History  Diagnosis Date  . Hypertension   . Diabetes mellitus, type 2   . Hyperlipidemia   . 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 Iron deficiency anemia 11/21/2012    At time of presentation.  Feraheme 1020 mg on 11/21/12.   Past Surgical History  Procedure Laterality Date  . Cataract extraction    . Cystoscopy w/ ureteral stent placement Right 11/03/2012    Procedure: CYSTOSCOPY WITH RETROGRADE PYELOGRAM/URETERAL STENT PLACEMENT;  Surgeon: Anner Crete, MD;  Location: [facility];  Service: Urology;  Laterality: Right;  . Portacath placement Left 11/03/2012    Procedure: INSERTION PORT-A-CATH;  Surgeon: Marlane Hatcher, MD;  Location: [facility];  Service: General;  Laterality: Left;  Insertion Port-A-Cath Left Subclavian   Family History  Problem Relation Age of Onset  . Cancer Father   . Cancer Brother    History  Substance Use Topics  . Smoking status: Never Smoker   . Smokeless tobacco: Never Used  . Alcohol Use: No   OB History   Grav Para Term Preterm Abortions TAB SAB Ect Mult Living                 Review of Systems  Constitutional: Positive for chills. Negative for fever.   Gastrointestinal: Positive for abdominal pain and constipation. Negative for vomiting and blood in stool.  Genitourinary: Positive for dysuria.  Neurological: Positive for weakness (generalized).  All other systems reviewed and are negative.    Allergies  Review of patient's allergies indicates no known allergies.  Home Medications   Current Outpatient Rx  Name  Route  Sig  Dispense  Refill  . acetaminophen (TYLENOL) 325 MG tablet   Oral   Take 650 mg by mouth every 6 (six) hours as needed for pain.         Marland Kitchen acyclovir (ZOVIRAX) 400 MG tablet   Oral   Take 1 tablet (400 mg total) by mouth daily.   30 tablet   3   . allopurinol (ZYLOPRIM) 300 MG tablet   Oral   Take 1 tablet (300 mg total) by mouth daily.   30 tablet   3   . atorvastatin (LIPITOR) 80 MG tablet   Oral   Take 80 mg by mouth every morning.         Marland Kitchen dexamethasone (DECADRON) 4 MG tablet   Oral   Take 4 mg by mouth 2 (two) times daily with a meal. Take daily starting the day after chemotherapy for 2 days. Take with food.         . docusate sodium (COLACE) 100 MG capsule   Oral   Take 100-200 mg by mouth 2 (two) times daily as needed for constipation.          Marland Kitchen  furosemide (LASIX) 20 MG tablet   Oral   Take 20 mg by mouth 2 (two) times daily.          Marland Kitchen glipiZIDE (GLUCOTROL XL) 10 MG 24 hr tablet   Oral   Take 10 mg by mouth 2 (two) times daily.         . insulin detemir (LEVEMIR) 100 UNIT/ML injection   Subcutaneous   Inject 40 Units into the skin 2 (two) times daily.         Marland Kitchen lidocaine-prilocaine (EMLA) cream   Topical   Apply 1 application topically as needed. 1 hour prior to Chemo         . LORazepam (ATIVAN) 0.5 MG tablet   Oral   Take 0.5 mg by mouth every 4 (four) hours as needed (nausea).          . Multiple Vitamin (MULTIVITAMIN WITH MINERALS) TABS   Oral   Take 1 tablet by mouth daily.         . ondansetron (ZOFRAN) 8 MG tablet   Oral   Take 1 tablet (8 mg  total) by mouth 2 (two) times daily. Take two times a day starting the day after chemo for 2 days. Then take two times a day as needed for nausea or vomiting.   30 tablet   1   . phenazopyridine (PYRIDIUM) 200 MG tablet   Oral   Take 200 mg by mouth 3 (three) times daily as needed for pain.         Marland Kitchen PRESCRIPTION MEDICATION   Intravenous   Inject 1 application into the vein See admin instructions. Pt has chemo tx done once a month on Tuesday and Wednesday. Pt will be due next week for. Pt is follow by Dr. Dellis Anes         . sulfamethoxazole-trimethoprim (BACTRIM DS,SEPTRA DS) 800-160 MG per tablet   Oral   Take 1 tablet by mouth daily. Take one tablet on Monday-Wednesday-and Friday for 6 months          BP 155/58  Pulse 96  Temp(Src) 98.5 F (36.9 C) (Oral)  SpO2 95% Physical Exam  Vitals reviewed. Constitutional: She is oriented to person, place, and time. She appears well-developed and well-nourished.  HENT:  Head: Normocephalic and atraumatic.  Right Ear: External ear normal.  Left Ear: External ear normal.  Nose: Nose normal.  Eyes: Right eye exhibits no discharge. Left eye exhibits no discharge.  Cardiovascular: Normal rate, regular rhythm and normal heart sounds.   Pulmonary/Chest: Effort normal and breath sounds normal.  Abdominal: Soft. There is tenderness (right sided). There is CVA tenderness (right).  Genitourinary: Rectum normal. Guaiac negative stool.  Small amt of soft stool in vault, no impaction  Neurological: She is alert and oriented to person, place, and time.  Skin: Skin is warm and dry.    ED Course   Procedures (including critical care time)  Labs Reviewed  GLUCOSE, CAPILLARY - Abnormal; Notable for the following:    Glucose-Capillary 174 (*)    All other components within normal limits  CBC WITH DIFFERENTIAL - Abnormal; Notable for the following:    WBC 14.5 (*)    RBC 3.26 (*)    Hemoglobin 9.7 (*)    HCT 29.9 (*)    RDW 23.4  (*)    Neutrophils Relative % 84 (*)    Neutro Abs 12.1 (*)    Lymphocytes Relative 9 (*)    All other components within  normal limits  COMPREHENSIVE METABOLIC PANEL - Abnormal; Notable for the following:    Sodium 133 (*)    Glucose, Bld 216 (*)    BUN 35 (*)    Creatinine, Ser 2.54 (*)    Total Protein 5.6 (*)    Albumin 2.6 (*)    Alkaline Phosphatase 182 (*)    GFR calc non Af Amer 18 (*)    GFR calc Af Amer 21 (*)    All other components within normal limits  LIPASE, BLOOD - Abnormal; Notable for the following:    Lipase 139 (*)    All other components within normal limits  URINALYSIS, ROUTINE W REFLEX MICROSCOPIC - Abnormal; Notable for the following:    APPearance CLOUDY (*)    Hgb urine dipstick SMALL (*)    Protein, ur 100 (*)    Leukocytes, UA MODERATE (*)    All other components within normal limits  URINE MICROSCOPIC-ADD ON - Abnormal; Notable for the following:    Squamous Epithelial / LPF FEW (*)    Bacteria, UA MANY (*)    All other components within normal limits  URINE CULTURE  OCCULT BLOOD, POC DEVICE   Ct Abdomen Pelvis Wo Contrast  02/07/2013   *RADIOLOGY REPORT*  Clinical Data: Lymphoma, right flank pain, nausea, weakness, history of right ureteral stent placement  CT ABDOMEN AND PELVIS WITHOUT CONTRAST  Technique:  Multidetector CT imaging of the abdomen and pelvis was performed following the standard protocol without intravenous contrast.  Comparison: 09/22/2012, 11/07/2012  Findings: Minor basilar atelectasis versus scarring.  No lower lobe pneumonia, collapse or consolidation.  No pericardial or pleural effusion.  No significant hiatal hernia.  Abdomen:  Small dependent layering gallstones noted.  Gallbladder is otherwise collapsed.  No biliary dilatation or obstruction. Liver, biliary system, pancreas, spleen, and adrenal glands are within normal limits for noncontrast study.  Central mesenteric fat stranding noted about the mesenteric artery and vein  compatible with chronic panniculitis.  Right kidney demonstrates mild chronic hydronephrosis and hydroureter.  Right ureteral stent extends from the right UPJ into the bladder distally.  Left kidney and ureter demonstrate no acute obstruction or urinary tract calculus.  Negative for bowel obstruction, dilatation, ileus, or free air.  No abdominal free fluid, fluid collection, hemorrhage, or abscess.  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  Previously this measured 10.7 x 6.5 cm.  No pelvic free fluid, fluid collection, hemorrhage, new adenopathy, abscess, inguinal abnormality, or hernia.  Uterus and adnexa normal in size for age.  Minor diverticulosis of the sigmoid.  No acute distal bowel process.  Bones appear osteopenic.  Degenerative changes of the lumbar spine and SI joints.  IMPRESSION: Interval decrease in size of the paraspinous/presacral nodal mass encasing the IVC and aortic bifurcations.  Stable chronic mild hydronephrosis and hydroureter on the right side despite an indwelling right ureteral stent.  No acute obstructing urinary tract calculus demonstrated.  Cholelithiasis  Chronic central mesenteric panniculitis  Sigmoid diverticulosis   Original Report Authenticated By: Judie Petit. Shick, M.D.   1. UTI (urinary tract infection)   2. Pyelonephritis     MDM  72 yo female with multiple complaints, including nocturnal hypoglycemia (likely from increasing levemir as outpatient for hyperglycemia), constipation and general weakness. Found to have UTI, and with CVA tenderness and WBC, is most likely pyelo. D/w Dr. Laverle Patter (urology) due to her right sided stent, recommends  treating UTI and urology will follow for possible removal. Feel she needs fluids and IV abx, will admit to hospitalist.   Audree Camel, MD 02/07/13 1539

## 2013-02-07 NOTE — H&P (Addendum)
[facility] History and Physical  AMBIKA [name] WJX:[id number] DOB: 09-20-1940 DOA: 02/07/2013  Referring physician: Dr. Criss Alvine, ER physician PCP: [name], MD  Specialists:   Chief Complaint: Generalized weakness  HPI: [name] [name] is a 72 y.o. female with multiple medical problems including lymphoma on active chemotherapy. She also has recurrent urinary tract infections with chronic hydronephrosis and right ureter stent placement. The patient is a very poor historian. She reports that she's had increasing generalized weakness over the past several days. She is also been waking up at night and "not feeling right". She's had hot flashes, sweats. She checks her blood sugars and finds it to be in the 60s. She reports having a fever as several days ago. She also describes significant dysuria. She has been constipated and denies any diarrhea. She's not having her vomiting but does feel nauseous. She has diffuse abdominal pain.  She presents to the emergency room where CT scan of the abdomen and pelvis shows interval decrease in the size of the paraspinous/presacral nodal mass encasing the IVC and aorta bifurcations. Her urine  Is indicative of a urinary tract infection. She also has a significant leukocytosis.multiple medical problems, she has been referred for admission.   Review of Systems:  Pertinent positives as per history of present illness, otherwise negative  Past Medical History  Diagnosis Date  . Hypertension   . Diabetes mellitus, type 2   . Hyperlipidemia   . 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 Iron deficiency anemia 11/21/2012    At time of presentation.  Feraheme 1020 mg on 11/21/12.   Past Surgical History  Procedure Laterality Date  . Cataract extraction    . Cystoscopy w/ ureteral stent placement Right 11/03/2012    Procedure: CYSTOSCOPY WITH RETROGRADE PYELOGRAM/URETERAL STENT PLACEMENT;   Surgeon: Anner Crete, MD;  Location: [facility];  Service: Urology;  Laterality: Right;  . Portacath placement Left 11/03/2012    Procedure: INSERTION PORT-A-CATH;  Surgeon: Marlane Hatcher, MD;  Location: [facility];  Service: General;  Laterality: Left;  Insertion Port-A-Cath Left Subclavian   Social History:  reports that she has never smoked. She has never used smokeless tobacco. She reports that she does not drink alcohol or use illicit drugs.   No Known Allergies  Family History  Problem Relation Age of Onset  . Cancer Father   . Cancer Brother     Prior to Admission medications   Medication Sig Start Date End Date Taking? Authorizing Provider  acetaminophen (TYLENOL) 325 MG tablet Take 650 mg by mouth every 6 (six) hours as needed for pain.   Yes Historical Provider, MD  acyclovir (ZOVIRAX) 400 MG tablet Take 1 tablet (400 mg total) by mouth daily. 11/21/12  Yes Randall An, MD  allopurinol (ZYLOPRIM) 300 MG tablet Take 1 tablet (300 mg total) by mouth daily. 11/21/12  Yes Randall An, MD  atorvastatin (LIPITOR) 80 MG tablet Take 80 mg by mouth every morning.   Yes Historical Provider, MD  dexamethasone (DECADRON) 4 MG tablet Take 4 mg by mouth 2 (two) times daily with a meal. Take daily starting the day after chemotherapy for 2 days. Take with food. 01/23/13  Yes Maurine Minister Kefalas, PA-C  docusate sodium (COLACE) 100 MG capsule Take 100-200 mg by mouth 2 (two) times daily as needed for constipation.    Yes Historical Provider, MD  furosemide (LASIX) 20 MG tablet Take 20 mg by mouth  2 (two) times daily.    Yes Historical Provider, MD  glipiZIDE (GLUCOTROL XL) 10 MG 24 hr tablet Take 10 mg by mouth 2 (two) times daily.   Yes Historical Provider, MD  insulin detemir (LEVEMIR) 100 UNIT/ML injection Inject 40 Units into the skin 2 (two) times daily.   Yes Historical Provider, MD  lidocaine-prilocaine (EMLA) cream Apply 1 application topically as needed. 1 hour prior to Chemo 11/21/12   Yes Historical Provider, MD  LORazepam (ATIVAN) 0.5 MG tablet Take 0.5 mg by mouth every 4 (four) hours as needed (nausea).    Yes Historical Provider, MD  Multiple Vitamin (MULTIVITAMIN WITH MINERALS) TABS Take 1 tablet by mouth daily.   Yes Historical Provider, MD  ondansetron (ZOFRAN) 8 MG tablet Take 1 tablet (8 mg total) by mouth 2 (two) times daily. Take two times a day starting the day after chemo for 2 days. Then take two times a day as needed for nausea or vomiting. 11/21/12  Yes Randall An, MD  phenazopyridine (PYRIDIUM) 200 MG tablet Take 200 mg by mouth 3 (three) times daily as needed for pain.   Yes Historical Provider, MD  PRESCRIPTION MEDICATION Inject 1 application into the vein See admin instructions. Pt has chemo tx done once a month on Tuesday and Wednesday. Pt will be due next week for. Pt is follow by Dr. Dellis Anes   Yes Historical Provider, MD  sulfamethoxazole-trimethoprim (BACTRIM DS,SEPTRA DS) 800-160 MG per tablet Take 1 tablet by mouth daily. Take one tablet on Monday-Wednesday-and Friday for 6 months 11/22/12  Yes Ellouise Newer, PA-C   Physical Exam: Filed Vitals:   02/07/13 1140 02/07/13 1351 02/07/13 1724  BP: 155/58 170/58 169/73  Pulse: 96 97 86  Temp: 98.5 F (36.9 C)  98.4 F (36.9 C)  TempSrc: Oral  Oral  Resp:  22 20  Height:   5\' 5"  (1.651 m)  Weight:   98.884 kg (218 lb)  SpO2: 95% 97% 98%     General:  No acute distress  Eyes: Pupils are equal round react to light  ENT: Mucous membranes are moist  Neck: Supple  Cardiovascular: S1, S2, regular rate and rhythm  Respiratory: Clear to auscultation bilaterally  Abdomen: Soft, mild tenderness diffusely, positive bowel sounds  Skin: No rashes  Musculoskeletal: 2+ edema lower extremities bilaterally  Psychiatric: Slow to respond, but normal affect and cooperative with exam  Neurologic: Grossly intact, nonfocal  Labs on Admission:  Basic Metabolic Panel:  Recent Labs Lab  02/07/13 1232  NA 133*  K 4.2  CL 98  CO2 25  GLUCOSE 216*  BUN 35*  CREATININE 2.54*  CALCIUM 8.5   Liver Function Tests:  Recent Labs Lab 02/07/13 1232  AST 20  ALT 15  ALKPHOS 182*  BILITOT 0.3  PROT 5.6*  ALBUMIN 2.6*    Recent Labs Lab 02/07/13 1232  LIPASE 139*   No results found for this basename: AMMONIA,  in the last 168 hours CBC:  Recent Labs Lab 02/07/13 1232  WBC 14.5*  NEUTROABS 12.1*  HGB 9.7*  HCT 29.9*  MCV 91.7  PLT 223   Cardiac Enzymes: No results found for this basename: CKTOTAL, CKMB, CKMBINDEX, TROPONINI,  in the last 168 hours  BNP (last 3 results)  Recent Labs  12/01/12 1150  PROBNP 4827.0*   CBG:  Recent Labs Lab 02/07/13 1132  GLUCAP 174*    Radiological Exams on Admission: Ct Abdomen Pelvis Wo Contrast  02/07/2013   *RADIOLOGY  REPORT*  Clinical Data: Lymphoma, right flank pain, nausea, weakness, history of right ureteral stent placement  CT ABDOMEN AND PELVIS WITHOUT CONTRAST  Technique:  Multidetector CT imaging of the abdomen and pelvis was performed following the standard protocol without intravenous contrast.  Comparison: 09/22/2012, 11/07/2012  Findings: Minor basilar atelectasis versus scarring.  No lower lobe pneumonia, collapse or consolidation.  No pericardial or pleural effusion.  No significant hiatal hernia.  Abdomen:  Small dependent layering gallstones noted.  Gallbladder is otherwise collapsed.  No biliary dilatation or obstruction. Liver, biliary system, pancreas, spleen, and adrenal glands are within normal limits for noncontrast study.  Central mesenteric fat stranding noted about the mesenteric artery and vein compatible with chronic panniculitis.  Right kidney demonstrates mild chronic hydronephrosis and hydroureter.  Right ureteral stent extends from the right UPJ into the bladder distally.  Left kidney and ureter demonstrate no acute obstruction or urinary tract calculus.  Negative for bowel obstruction,  dilatation, ileus, or free air.  No abdominal free fluid, fluid collection, hemorrhage, or abscess.  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  Previously this measured 10.7 x 6.5 cm.  No pelvic free fluid, fluid collection, hemorrhage, new adenopathy, abscess, inguinal abnormality, or hernia.  Uterus and adnexa normal in size for age.  Minor diverticulosis of the sigmoid.  No acute distal bowel process.  Bones appear osteopenic.  Degenerative changes of the lumbar spine and SI joints.  IMPRESSION: Interval decrease in size of the paraspinous/presacral nodal mass encasing the IVC and aortic bifurcations.  Stable chronic mild hydronephrosis and hydroureter on the right side despite an indwelling right ureteral stent.  No acute obstructing urinary tract calculus demonstrated.  Cholelithiasis  Chronic central mesenteric panniculitis  Sigmoid diverticulosis   Original Report Authenticated By: Judie Petit. Miles Costain, M.D.    EKG: Independently reviewed. No acute ST or T changes  Assessment/Plan Active Problems:   Follicular lymphoma   Chronic kidney disease (CKD), stage IV (severe)   Diabetes mellitus type 2 in obese   Hypertension   UTI (urinary tract infection)   Leukocytosis, unspecified   Hypoglycemia   1.  urinary tract infection. We'll start the patient empirically on Rocephin. Urine culture has been sent and will need followup. 2. Leukocytosis, likely secondary to urinary tract infection. We'll continue to follow. 3. Hypoglycemia in setting of diabetes. She reports that her insulin was recently adjusted. It also appears that she is on a sulfonylurea. With her history of chronic kidney disease, this may not be the best choice. We will hold her sulfonylurea and decrease her insulin dose. Monitor blood sugars. 4. Chronic kindey disease stage IV. Creatinine/GFR appears to be at baseline. 5. Iron  deficiency anemia. Followed by hematology. Hemoglobin appears to be near baseline.  6. Elevated lipase level. Patient does not have any clinical evidence or radiographic evidence of acute pancreatitis. We'll continue to follow clinically. 7. Constipation. She's been taking MiraLAX and docusate at home without any relief. She'll be provided with an enema.  Code Status: full code Family Communication: discussed with husband at bedside Disposition Plan: pending hospital course  Time spent:  [facility] Pager [phone]  If 7PM-7AM, please contact night-coverage [email or web address] Password [facility] 02/07/2013, 6:25 PM

## 2013-02-08 LAB — URINE CULTURE

## 2013-02-08 LAB — CBC
Platelets: 211 10*3/uL (ref 150–400)
RBC: 3.24 MIL/uL — ABNORMAL LOW (ref 3.87–5.11)
WBC: 12 10*3/uL — ABNORMAL HIGH (ref 4.0–10.5)

## 2013-02-08 LAB — BASIC METABOLIC PANEL
Calcium: 8.8 mg/dL (ref 8.4–10.5)
GFR calc non Af Amer: 20 mL/min — ABNORMAL LOW (ref >90)
Sodium: 134 mEq/L — ABNORMAL LOW (ref 135–145)

## 2013-02-08 LAB — GLUCOSE, CAPILLARY

## 2013-02-08 LAB — HEMOGLOBIN A1C: Mean Plasma Glucose: 226 mg/dL — ABNORMAL HIGH (ref <117)

## 2013-02-08 MED ORDER — ACYCLOVIR 200 MG PO CAPS
400.0000 mg | ORAL_CAPSULE | Freq: Every day | ORAL | Status: DC
  Administered 2013-02-08: 400 mg via ORAL
  Filled 2013-02-08: qty 2

## 2013-02-08 MED ORDER — ENOXAPARIN SODIUM 30 MG/0.3ML ~~LOC~~ SOLN: 30.0000 mg | SUBCUTANEOUS | Status: DC

## 2013-02-08 MED ORDER — CIPROFLOXACIN HCL 500 MG PO TABS: 250.0000 mg | ORAL_TABLET | Freq: Two times a day (BID) | ORAL | Status: DC

## 2013-02-08 MED ORDER — INSULIN DETEMIR 100 UNIT/ML ~~LOC~~ SOLN: 30.0000 [IU] | Freq: Two times a day (BID) | SUBCUTANEOUS | Status: DC

## 2013-02-08 NOTE — Progress Notes (Signed)
INITIAL NUTRITION ASSESSMENT  DOCUMENTATION CODES Per approved criteria  -Obesity Unspecified   INTERVENTION: RD will follow for nutrition care and re-visit oral supplement offer if pt meal intake is not meeting her energy -protein goals.  NUTRITION DIAGNOSIS: Inadequate oral intake related to early satiety as evidenced by 13# wt loss x 60 day and pt reported  diet hx.    Goal: Pt to meet >/= 90% of their estimated nutrition needs   Monitor:  Po intake, labs and wt trends  Reason for Assessment: Malnutrition Screen Score = 2  72 y.o. female  Admitting c/o generalized weakness    ASSESSMENT: Pt hx includes Lymphoma, recurrent UTI's, DM, HLD. She reports that she has been receiving chemotherapy treatments since May 2014. Husband is present and says that he has been preparing most of their meals. Weight hx shows unintentional wt loss 13#, 6% x 60 days trending towards clinical significance. Husband says pt is eating less than usual due to "getting full more quickly than she used to". Inadequate oral intake due to early satiety and suspect pt is becoming malnourished. We discussed the importance of adequate nutrition intake encouraged her to add oral nutrition to supplement her daily provision of macro/micro-nutrients but she refuses and would prefer to meet her nutrition goals via meal intake.  Height: Ht Readings from Last 1 Encounters:  02/07/13 5\' 5"  (1.651 m)    Weight: Wt Readings from Last 1 Encounters:  02/07/13 218 lb (98.884 kg)    Ideal Body Weight: 125# (56.8 kg)  % Ideal Body Weight: 174%  Wt Readings from Last 10 Encounters:  02/07/13 218 lb (98.884 kg)  01/30/13 218 lb 9.6 oz (99.156 kg)  01/24/13 217 lb (98.431 kg)  01/23/13 217 lb 4.8 oz (98.567 kg)  01/04/13 222 lb (100.699 kg)  01/03/13 224 lb 9.6 oz (101.878 kg)  12/18/12 230 lb 4.8 oz (104.463 kg)  12/15/12 210 lb 8.6 oz (95.5 kg)  11/29/12 233 lb (105.688 kg)  11/28/12 231 lb 6.4 oz (104.962 kg)     Usual Body Weight: 220-230#  % Usual Body Weight: 99%  BMI:  Body mass index is 36.28 kg/(m^2).Obesity Class II  Estimated Nutritional Needs: Kcal: 1400-1650 Protein: 75-85 gr Fluid: >2000 ml/day  Skin: No issues noted  Diet Order: Carb Control  EDUCATION NEEDS: -Education needs addressed   Intake/Output Summary (Last 24 hours) at 02/08/13 1031 Last data filed at 02/08/13 0700  Gross per 24 hour  Intake      0 ml  Output    303 ml  Net   -303 ml    Last BM: 02/06/13  Constipation, enema ordered  Labs:   Recent Labs Lab 02/07/13 1232 02/08/13 0559  NA 133* 134*  K 4.2 4.1  CL 98 104  CO2 25 21  BUN 35* 30*  CREATININE 2.54* 2.26*  CALCIUM 8.5 8.8  GLUCOSE 216* 65*    CBG (last 3)   Recent Labs  02/07/13 2050 02/08/13 0750 02/08/13 0836  GLUCAP 176* 67* 118*    Scheduled Meds: . acyclovir  400 mg Oral Daily  . allopurinol  300 mg Oral Daily  . atorvastatin  80 mg Oral q morning - 10a  . cefTRIAXone (ROCEPHIN)  IV  1 g Intravenous Q24H  . enoxaparin (LOVENOX) injection  30 mg Subcutaneous Q24H  . furosemide  20 mg Oral BID  . insulin aspart  0-15 Units Subcutaneous TID WC  . insulin detemir  35 Units Subcutaneous BID  . milk  and molasses   Rectal Once  . sodium chloride  3 mL Intravenous Q12H    Continuous Infusions:   Past Medical History  Diagnosis Date  . Hypertension   . Diabetes mellitus, type 2   . Hyperlipidemia   . 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 Iron deficiency anemia 11/21/2012    At time of presentation.  Feraheme 1020 mg on 11/21/12.    Past Surgical History  Procedure Laterality Date  . Cataract extraction    . Cystoscopy w/ ureteral stent placement Right 11/03/2012    Procedure: CYSTOSCOPY WITH RETROGRADE PYELOGRAM/URETERAL STENT PLACEMENT;  Surgeon: Anner Crete, MD;  Location: [facility];  Service: Urology;  Laterality: Right;  . Portacath  placement Left 11/03/2012    Procedure: INSERTION PORT-A-CATH;  Surgeon: Marlane Hatcher, MD;  Location: [facility];  Service: General;  Laterality: Left;  Insertion Port-A-Cath Left Subclavian    Royann Shivers MS,RD,LDN,CSG Office: [phone] Pager: [phone]

## 2013-02-08 NOTE — Progress Notes (Signed)
UR Chart Review Completed  

## 2013-02-08 NOTE — Plan of Care (Signed)
Problem: Discharge Progression Outcomes Goal: Discharge plan in place and appropriate Discussed DC plan

## 2013-02-08 NOTE — Discharge Summary (Signed)
Physician Discharge Summary  [name] [name] UJW:[id number] DOB: 12/31/1940 DOA: 02/07/2013  PCP: [name], MD  Admit date: 02/07/2013 Discharge date: 02/08/2013  Time spent: 40 minutes  Recommendations for Outpatient Follow-up:  1. Follow up with urology in the outpatient setting  Discharge Diagnoses:  Active Problems:   Follicular lymphoma   Chronic kidney disease (CKD), stage IV (severe)   Diabetes mellitus type 2 in obese   Hypertension   UTI (urinary tract infection)   Leukocytosis, unspecified   Hypoglycemia   Discharge Condition: improved  Diet recommendation: low salt, low carb  Filed Weights   02/07/13 1724  Weight: 98.884 kg (218 lb)    History of present illness:  [name] [name] is a 72 y.o. female with multiple medical problems including lymphoma on active chemotherapy. She also has recurrent urinary tract infections with chronic hydronephrosis and right ureter stent placement. The patient is a very poor historian. She reports that she's had increasing generalized weakness over the past several days. She is also been waking up at night and "not feeling right". She's had hot flashes, sweats. She checks her blood sugars and finds it to be in the 60s. She reports having a fever as several days ago. She also describes significant dysuria. She has been constipated and denies any diarrhea. She's not having her vomiting but does feel nauseous. She has diffuse abdominal pain. She presents to the emergency room where CT scan of the abdomen and pelvis shows interval decrease in the size of the paraspinous/presacral nodal mass encasing the IVC and aorta bifurcations. Her urine Is indicative of a urinary tract infection. She also has a significant leukocytosis.multiple medical problems, she has been referred for admission.   Hospital Course:  This patient was admitted to the hospital with urinary tract infection.  She has multiple medical problems including CKD 4, chronic  right sided hydronephrosis with right ureter stent placement, follicular lymphoma currently on active treatments.  She was found to have a positive UA and was empirically started on rocephin. CT of the abdomen and pelvis indicated stable chronic mild hydronephrosis and hydroureter on the right side despite indwelling right ureteral stent.  There was also mention of no obstructing urinary tract calculus.  This was discussed by EDP with patient's urologist and treatment of UTI was recommended, without any further urology procedures at this time.  Patient is clinically improved, is afebrile and leukocytosis is improving.  She can be transitioned to po cipro.  Urine culture is pending at the time of discharge.  Patient was also found to be hyoglycemic in the mornings.  Her levemir has been decreased in dosing and glipizide has been discontinued.  She can follow up with her primary care physician regarding further adjustments.  Procedures:  none  Consultations:  none  Discharge Exam: Filed Vitals:   02/07/13 1140 02/07/13 1351 02/07/13 1724 02/07/13 2053  BP: 155/58 170/58 169/73 150/75  Pulse: 96 97 86 96  Temp: 98.5 F (36.9 C)  98.4 F (36.9 C) 98.5 F (36.9 C)  TempSrc: Oral  Oral Oral  Resp:  22 20 20   Height:   5\' 5"  (1.651 m)   Weight:   98.884 kg (218 lb)   SpO2: 95% 97% 98%     General: NAD Cardiovascular: s1 s2 rrr Respiratory: cta b  Discharge Instructions  Discharge Orders   Future Appointments Provider Department Dept Phone   02/22/2013 11:30 AM Claudia Desanctis [facility] CENTER [phone]   02/27/2013 8:45  AM [facility] Team B [facility] CANCER CENTER [phone]   02/28/2013 9:45 AM [facility] Team A [place] [facility]ENTER [phone]   03/01/2013 10:30 AM [facility] Chair 7 [facility] CANCER CENTER [phone]   Future Orders Complete By Expires     Call MD for:  extreme fatigue  As directed     Call MD for:  persistant dizziness or light-headedness  As  directed     Call MD for:  temperature >100.4  As directed     Diet - low sodium heart healthy  As directed     Diet Carb Modified  As directed     Face-to-face encounter (required for Medicare/Medicaid patients)  As directed     Comments:      I MEMON,JEHANZEB certify that this patient is under my care and that I, or a nurse practitioner or physician's assistant working with me, had a face-to-face encounter that meets the physician face-to-face encounter requirements with this patient on 02/08/2013. The encounter with the patient was in whole, or in part for the following medical condition(s) which is the primary reason for home health care (List medical condition): admitted with UTI and deconditioning, would benefit from physical therapy    Questions:      The encounter with the patient was in whole, or in part, for the following medical condition, which is the primary reason for home health care:  uti, deconditioning    I certify that, based on my findings, the following services are medically necessary home health services:  Physical therapy    My clinical findings support the need for the above services:  Unable to leave home safely without assistance and/or assistive device    Further, I certify that my clinical findings support that this patient is homebound due to:  Unable to leave home safely without assistance    Reason for Medically Necessary Home Health Services:  Therapy- Investment banker, operational, Patent examiner    Home Health  As directed     Questions:      To provide the following care/treatments:  PT    Increase activity slowly  As directed         Medication List    STOP taking these medications       glipiZIDE 10 MG 24 hr tablet  Commonly known as:  GLUCOTROL XL      TAKE these medications       acetaminophen 325 MG tablet  Commonly known as:  TYLENOL  Take 650 mg by mouth every 6 (six) hours as needed for pain.     acyclovir 400 MG tablet  Commonly  known as:  ZOVIRAX  Take 1 tablet (400 mg total) by mouth daily.     allopurinol 300 MG tablet  Commonly known as:  ZYLOPRIM  Take 1 tablet (300 mg total) by mouth daily.     atorvastatin 80 MG tablet  Commonly known as:  LIPITOR  Take 80 mg by mouth every morning.     ciprofloxacin 500 MG tablet  Commonly known as:  CIPRO  Take 0.5 tablets (250 mg total) by mouth 2 (two) times daily.     dexamethasone 4 MG tablet  Commonly known as:  DECADRON  Take 4 mg by mouth 2 (two) times daily with a meal. Take daily starting the day after chemotherapy for 2 days. Take with food.     docusate sodium 100 MG capsule  Commonly known as:  COLACE  Take 100-200 mg  by mouth 2 (two) times daily as needed for constipation.     furosemide 20 MG tablet  Commonly known as:  LASIX  Take 20 mg by mouth 2 (two) times daily.     insulin detemir 100 UNIT/ML injection  Commonly known as:  LEVEMIR  Inject 0.3 mLs (30 Units total) into the skin 2 (two) times daily.     lidocaine-prilocaine cream  Commonly known as:  EMLA  Apply 1 application topically as needed. 1 hour prior to Chemo     LORazepam 0.5 MG tablet  Commonly known as:  ATIVAN  Take 0.5 mg by mouth every 4 (four) hours as needed (nausea).     multivitamin with minerals Tabs  Take 1 tablet by mouth daily.     ondansetron 8 MG tablet  Commonly known as:  ZOFRAN  Take 1 tablet (8 mg total) by mouth 2 (two) times daily. Take two times a day starting the day after chemo for 2 days. Then take two times a day as needed for nausea or vomiting.     phenazopyridine 200 MG tablet  Commonly known as:  PYRIDIUM  Take 200 mg by mouth 3 (three) times daily as needed for pain.     PRESCRIPTION MEDICATION  Inject 1 application into the vein See admin instructions. Pt has chemo tx done once a month on Tuesday and Wednesday. Pt will be due next week for. Pt is follow by Dr. Dellis Anes     sulfamethoxazole-trimethoprim 800-160 MG per tablet   Commonly known as:  BACTRIM DS,SEPTRA DS  Take 1 tablet by mouth daily. Take one tablet on Monday-Wednesday-and Friday for 6 months       No Known Allergies     Follow-up Information   Follow up with Anner Crete, MD. (in next 1-2 weeks)    Contact information:   [address] [place] [address] [phone]       Follow up with [name], MD. Schedule an appointment as soon as possible for a visit in 2 weeks.   Contact information:   [address] [facility] [address] [phone]        The results of significant diagnostics from this hospitalization (including imaging, microbiology, ancillary and laboratory) are listed below for reference.    Significant Diagnostic Studies: Ct Abdomen Pelvis Wo Contrast  02/07/2013   *RADIOLOGY REPORT*  Clinical Data: Lymphoma, right flank pain, nausea, weakness, history of right ureteral stent placement  CT ABDOMEN AND PELVIS WITHOUT CONTRAST  Technique:  Multidetector CT imaging of the abdomen and pelvis was performed following the standard protocol without intravenous contrast.  Comparison: 09/22/2012, 11/07/2012  Findings: Minor basilar atelectasis versus scarring.  No lower lobe pneumonia, collapse or consolidation.  No pericardial or pleural effusion.  No significant hiatal hernia.  Abdomen:  Small dependent layering gallstones noted.  Gallbladder is otherwise collapsed.  No biliary dilatation or obstruction. Liver, biliary system, pancreas, spleen, and adrenal glands are within normal limits for noncontrast study.  Central mesenteric fat stranding noted about the mesenteric artery and vein compatible with chronic panniculitis.  Right kidney demonstrates mild chronic hydronephrosis and hydroureter.  Right ureteral stent extends from the right UPJ into the bladder distally.  Left kidney and ureter demonstrate no acute obstruction or urinary tract calculus.  Negative for bowel obstruction, dilatation, ileus, or free  air.  No abdominal free fluid, fluid collection, hemorrhage, or abscess.  Pelvis: Chronic ill-defined paraspinous/presacral mass encasing the IVC and aortic bifurcations  is smaller in size measuring 8.6 x 4.2 cm at a similar level.  Previously this measured 10.7 x 6.5 cm.  No pelvic free fluid, fluid collection, hemorrhage, new adenopathy, abscess, inguinal abnormality, or hernia.  Uterus and adnexa normal in size for age.  Minor diverticulosis of the sigmoid.  No acute distal bowel process.  Bones appear osteopenic.  Degenerative changes of the lumbar spine and SI joints.  IMPRESSION: Interval decrease in size of the paraspinous/presacral nodal mass encasing the IVC and aortic bifurcations.  Stable chronic mild hydronephrosis and hydroureter on the right side despite an indwelling right ureteral stent.  No acute obstructing urinary tract calculus demonstrated.  Cholelithiasis  Chronic central mesenteric panniculitis  Sigmoid diverticulosis   Original Report Authenticated By: Judie Petit. Miles Costain, M.D.   Ct Head Wo Contrast  02/01/2013   *RADIOLOGY REPORT*  Clinical Data: Mental status changes.  Follicular lymphoma.  CT HEAD WITHOUT CONTRAST  Technique:  Contiguous axial images were obtained from the base of the skull through the vertex without contrast.  Comparison: None.  Findings: There is no acute intracranial hemorrhage, infarction, or mass lesion.  Brain parenchyma is normal.  No ventricular dilatation.  No osseous abnormality.  The adjacent soft tissues are normal.  IMPRESSION: Normal exam.   Original Report Authenticated By: Francene Boyers, M.D.   Portable Chest 1 View  02/07/2013   *RADIOLOGY REPORT*  Clinical Data: Leukocytosis.  PORTABLE CHEST - 1 VIEW  Comparison: 01/04/2013  Findings: Left subclavian Port-A-Cath unchanged.  Lungs are somewhat hypoinflated without consolidation or effusion.  There is mild stable cardiomegaly.  Remainder of the exam is unchanged.  IMPRESSION: Hypoinflation without acute  cardiopulmonary disease.  Left subclavian Port-A-Cath unchanged.   Original Report Authenticated By: Elberta Fortis, M.D.    Microbiology: No results found for this or any previous visit (from the past 240 hour(s)).   Labs: Basic Metabolic Panel:  Recent Labs Lab 02/07/13 1232 02/08/13 0559  NA 133* 134*  K 4.2 4.1  CL 98 104  CO2 25 21  GLUCOSE 216* 65*  BUN 35* 30*  CREATININE 2.54* 2.26*  CALCIUM 8.5 8.8   Liver Function Tests:  Recent Labs Lab 02/07/13 1232  AST 20  ALT 15  ALKPHOS 182*  BILITOT 0.3  PROT 5.6*  ALBUMIN 2.6*    Recent Labs Lab 02/07/13 1232  LIPASE 139*   No results found for this basename: AMMONIA,  in the last 168 hours CBC:  Recent Labs Lab 02/07/13 1232 02/08/13 0559  WBC 14.5* 12.0*  NEUTROABS 12.1*  --   HGB 9.7* 9.6*  HCT 29.9* 29.7*  MCV 91.7 91.7  PLT 223 211   Cardiac Enzymes: No results found for this basename: CKTOTAL, CKMB, CKMBINDEX, TROPONINI,  in the last 168 hours BNP: BNP (last 3 results)  Recent Labs  12/01/12 1150  PROBNP 4827.0*   CBG:  Recent Labs Lab 02/07/13 1132 02/07/13 2050 02/08/13 0750 02/08/13 0836 02/08/13 1126  GLUCAP 174* 176* 67* 118* 196*       Signed:  MEMON,JEHANZEB  [facility] 02/08/2013, 1:14 PM

## 2013-02-08 NOTE — Discharge Summary (Signed)
Pt stated she was ready to go home and pain was under control. Pt was educated on DC papers with husband in room.  Pt's IV was removed prior to DC and she was also give script for antibiotic.  Pt was wheeled to car by PCT and family.

## 2013-02-08 NOTE — Care Management Note (Signed)
    Page 1 of 1   02/08/2013     1:31:09 PM   CARE MANAGEMENT NOTE 02/08/2013  Patient:  [name] [name], [name] [name]   Account Number:  [id number]  Date Initiated:  02/08/2013  Documentation initiated by:  Rosemary Holms  Subjective/Objective Assessment:   Pt admitted from home with spouse. PT recommended HH PT. Pt and spouse chose [facility]. [facility] notified.     Action/Plan:   Anticipated DC Date:  02/08/2013   Anticipated DC Plan:  HOME W HOME HEALTH SERVICES      DC Planning Services  CM consult      Choice offered to / List presented to:          [facility] arranged  HH-2 PT      [facility] agency  [facility]   Status of service:  Completed, signed off Medicare Important Message given?  NA - LOS <3 / Initial given by admissions (If response is "NO", the following Medicare IM given date fields will be blank) Date Medicare IM given:   Date Additional Medicare IM given:    Discharge Disposition:  HOME W HOME HEALTH SERVICES  Per UR Regulation:    If discussed at Long Length of Stay Meetings, dates discussed:    Comments:  02/08/13 Rosemary Holms RN BSN CM

## 2013-02-08 NOTE — Evaluation (Signed)
Physical Therapy Evaluation Patient Details Name: [name] [name] MRN: [id number] DOB: May 10, 1941 Today's Date: 02/08/2013 Time: 1308-6578 PT Time Calculation (min): 53 min  PT Assessment / Plan / Recommendation History of Present Illness  Pt is admitted with UTI.  She states that she has been weaker over the past few days...has been receiving chemotherapy for treatment of Lymphoma.  She also states that she fell out of bed on 02-05-13 and has had bilateral knee pain since that time.  Clinical Impression   Pt was seen for evaluation.  She is very cooperative, reports being very sedentary at home.  She states that she has had intermittent knee problems chronically and now this recent fall OOB has exacerbated knee pain again.  Her gait is much more stable using a walker rather than a cane for gait.  She feels more secure with a walker and agrees to use her walker at home.  I would recommend HHPT at d/c for instructing pt in how to increase activity level as well as for home safety evaluation.    PT Assessment  Patient needs continued PT services    Follow Up Recommendations  Home health PT    Does the patient have the potential to tolerate intense rehabilitation    no  Barriers to Discharge        Equipment Recommendations  None recommended by PT    Recommendations for Other Services     Frequency Min 3X/week    Precautions / Restrictions Precautions Precautions: Fall Restrictions Weight Bearing Restrictions: No   Pertinent Vitals/Pain       Mobility  Bed Mobility Bed Mobility: Supine to Sit Supine to Sit: 6: Modified independent (Device/Increase time);HOB elevated Transfers Transfers: Sit to Stand;Stand to Sit Sit to Stand: 5: Supervision;With upper extremity assist;From bed Stand to Sit: 5: Supervision;With upper extremity assist;To chair/3-in-1 Ambulation/Gait Ambulation/Gait Assistance: 4: Min guard Ambulation Distance (Feet): 40 Feet (also 20' with  walker) Assistive device: Straight cane Ambulation/Gait Assistance Details: gait with cane is very labored and her posture is in trunk flexion...she c/o knee pain with gait.Marland KitchenMarland KitchenI have recommended that she use a walker.Marland KitchenMarland KitchenI demonstrated walker pattern of gait and then had her walk with it...she felt much more secure and gait was much more fluid Gait Pattern: Antalgic;Decreased stance time - left;Trunk flexed;Decreased hip/knee flexion - right;Decreased hip/knee flexion - left Gait velocity: very slow with a cane, WNL using a walker Stairs: No Wheelchair Mobility Wheelchair Mobility: No    Exercises General Exercises - Lower Extremity Long Arc Quad: AROM;Both;10 reps;Seated Heel Slides: AROM;Both;5 reps;Supine   PT Diagnosis: Difficulty walking;Abnormality of gait;Generalized weakness  PT Problem List: Decreased activity tolerance;Decreased strength;Decreased mobility;Decreased knowledge of use of DME;Obesity;Pain PT Treatment Interventions: DME instruction;Gait training;Therapeutic exercise;Patient/family education     PT Goals(Current goals can be found in the care plan section) Acute Rehab PT Goals Patient Stated Goal: return home PT Goal Formulation: With patient/family Time For Goal Achievement: 02/22/13 Potential to Achieve Goals: Good  Visit Information  Last PT Received On: 02/08/13 History of Present Illness: Pt is admitted with UTI.  She states that she has been weaker over the past few days...has been receiving chemotherapy for treatment of Lymphoma.  She also states that she fell out of bed on 02-05-13 and has had bilateral knee pain since that time.       Prior Functioning  Home Living Family/patient expects to be discharged to:: Private residence Living Arrangements: Spouse/significant other Available Help at Discharge: Family Type of Home: House  Home Access: Stairs to enter Entrance Stairs-Number of Steps: 2 Entrance Stairs-Rails: None Home Layout: One level Home  Equipment: Walker - 2 wheels;Cane - single point;Bedside commode Prior Function Level of Independence: Independent with assistive device(s) Comments: pt began using a cane over the past few weeks Communication Communication: No difficulties    Cognition  Cognition Arousal/Alertness: Awake/alert Behavior During Therapy: WFL for tasks assessed/performed Overall Cognitive Status: Within Functional Limits for tasks assessed    Extremity/Trunk Assessment Lower Extremity Assessment Lower Extremity Assessment: Generalized weakness Cervical / Trunk Assessment Cervical / Trunk Assessment: Normal   Balance Balance Balance Assessed: No (WNL by functional observation)  End of Session PT - End of Session Equipment Utilized During Treatment: Gait belt Activity Tolerance: Patient limited by fatigue Patient left: with call bell/phone within reach;with chair alarm set;with family/visitor present Nurse Communication: Mobility status  GP     Myrlene Broker L 02/08/2013, 10:36 AM

## 2013-02-08 NOTE — Plan of Care (Signed)
BS was 67  - Pt given 8oz OJ and BS will be rechecked to confirm it increased within range. BS was rechecked and was 118.

## 2013-02-13 ENCOUNTER — Ambulatory Visit (HOSPITAL_COMMUNITY): Payer: Medicare Other | Admitting: Oncology

## 2013-02-20 ENCOUNTER — Ambulatory Visit (HOSPITAL_COMMUNITY): Payer: Medicare Other

## 2013-02-20 ENCOUNTER — Inpatient Hospital Stay (HOSPITAL_COMMUNITY): Payer: Medicare Other

## 2013-02-21 ENCOUNTER — Inpatient Hospital Stay (HOSPITAL_COMMUNITY): Payer: Medicare Other

## 2013-02-22 ENCOUNTER — Encounter (HOSPITAL_COMMUNITY): Payer: Self-pay | Admitting: Oncology

## 2013-02-22 ENCOUNTER — Encounter (HOSPITAL_COMMUNITY): Payer: Medicare Other | Attending: Oncology | Admitting: Oncology

## 2013-02-22 VITALS — BP 129/77 | HR 87 | Temp 97.8°F | Resp 18 | Wt 222.8 lb

## 2013-02-22 DIAGNOSIS — C8299 Follicular lymphoma, unspecified, extranodal and solid organ sites: Secondary | ICD-10-CM | POA: Insufficient documentation

## 2013-02-22 DIAGNOSIS — C8290 Follicular lymphoma, unspecified, unspecified site: Secondary | ICD-10-CM

## 2013-02-22 DIAGNOSIS — N184 Chronic kidney disease, stage 4 (severe): Secondary | ICD-10-CM

## 2013-02-22 DIAGNOSIS — D509 Iron deficiency anemia, unspecified: Secondary | ICD-10-CM

## 2013-02-22 NOTE — Patient Instructions (Addendum)
[facility] Discharge Instructions  RECOMMENDATIONS MADE BY THE CONSULTANT AND ANY TEST RESULTS WILL BE SENT TO YOUR REFERRING PHYSICIAN.  We will continue with current chemotherapy plan.  Our goal is to give you 6 complete cycles of chemotherapy. Follow up with Urologist. Return to see MD in 4 weeks. Report any issues/concerns to [place] as needed.  Thank you for choosing [name] to provide your oncology and hematology care.  To afford each patient quality time with our providers, please arrive at least 15 minutes before your scheduled appointment time.  With your help, our goal is to use those 15 minutes to complete the necessary work-up to ensure our physicians have the information they need to help with your evaluation and healthcare recommendations.    Effective January 1st, 2014, we ask that you re-schedule your appointment with our physicians should you arrive 10 or more minutes late for your appointment.  We strive to give you quality time with our providers, and arriving late affects you and other patients whose appointments are after yours.    Again, thank you for choosing [facility].  Our hope is that these requests will decrease the amount of time that you wait before being seen by our physicians.       _____________________________________________________________  Should you have questions after your visit to [facility], please contact our office at [phone] between the hours of 8:30 a.m. and 5:00 p.m.  Voicemails left after 4:30 p.m. will not be returned until the following business day.  For prescription refill requests, have your pharmacy contact our office with your prescription refill request.

## 2013-02-22 NOTE — Progress Notes (Signed)
[name] Hector, MD [address] [place] [address]  Follicular lymphoma  CURRENT THERAPY: S/P 3 cycles of Rituxan/Bendamustine starting on 11/28/2012.  INTERVAL HISTORY: [name] [name] 72 y.o. 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   Unfortunately, the patient was admitted to the Hospital for UTI.  She has completed her course of antibiotics.  She as admitted on 02/07/2013 and discharged on 02/08/2013.  She was advised to follow-up with urologist in the outpatient setting. She has an upcoming appointment tomorrow.  I have encouraged her to make this appointment.  She may be a candidate for prophylactic antibiotics for UTI depending on evaluation by urologist.  She also reports to me that she has urgency incontinence.  I have asked her to discuss that with her urologist.    She reports that she is doing well.  She admits to feeling week and tired x 1 week after chemotherapy, but recovers completely prior to her next infusion.  She thus far is tolerating chemotherapy with respect to side effects of therapy.  She admits that her overall energy is much improved since beginning therapy and she believes she is much more stable on her feet.  However, this is her second UTI which she is at increased risk for simply for her poorly controlled diabetes, in addition to her Follicular lymphoma and its treatment.   I have encouraged her to continue follow-up with PCP and urologist as directed.   We will see her back in 4 weeks and she will move on with cycle 4 of chemotherapy as scheduled.    Past Medical History  Diagnosis Date  . Hypertension   . Diabetes mellitus, type 2   . Hyperlipidemia   . 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 Iron deficiency anemia 11/21/2012    At  time of presentation.  Feraheme 1020 mg on 11/21/12.    has Follicular lymphoma; Iron deficiency anemia; Chronic kidney disease (CKD), stage IV (severe); Diabetes mellitus type 2 in obese; Hypertension; UTI (urinary tract infection); Leukocytosis, unspecified; and Hypoglycemia on her problem list.     has No Known Allergies.  [name] had no medications administered during this visit.  Past Surgical History  Procedure Laterality Date  . Cataract extraction    . Cystoscopy w/ ureteral stent placement Right 11/03/2012    Procedure: CYSTOSCOPY WITH RETROGRADE PYELOGRAM/URETERAL STENT PLACEMENT;  Surgeon: Anner Crete, MD;  Location: [facility];  Service: Urology;  Laterality: Right;  . Portacath placement Left 11/03/2012    Procedure: INSERTION PORT-A-CATH;  Surgeon: Marlane Hatcher, MD;  Location: [facility];  Service: General;  Laterality: Left;  Insertion Port-A-Cath Left Subclavian    Denies any headaches, dizziness, double vision, fevers, chills, night sweats, nausea, vomiting, diarrhea, constipation, chest pain, heart palpitations, shortness of breath, blood in stool, black tarry stool, hematuria.   PHYSICAL EXAMINATION  ECOG PERFORMANCE STATUS: 2 - Symptomatic, <50% confined to bed  Filed Vitals:   02/22/13 1144  BP: 129/77  Pulse: 87  Temp: 97.8 F (36.6 C)  Resp: 18    GENERAL:alert, no distress, well nourished, well developed, comfortable, cooperative, obese and smiling SKIN: skin color, texture, turgor are normal, no rashes or significant lesions HEAD: Normocephalic, No masses, lesions, tenderness or abnormalities EYES: normal, PERRLA, EOMI, Conjunctiva are pink and non-injected EARS: External ears normal OROPHARYNX:mucous  membranes are moist  NECK: supple, no adenopathy, thyroid normal size, non-tender, without nodularity, no stridor, non-tender, trachea midline LYMPH:  no palpable lymphadenopathy BREAST:not examined LUNGS: clear to auscultation and percussion HEART:  regular rate & rhythm, no murmurs, no gallops, S1 normal and S2 normal ABDOMEN:abdomen soft, non-tender, obese and normal bowel sounds BACK: Back symmetric, no curvature. EXTREMITIES:less then 2 second capillary refill, no joint deformities, effusion, or inflammation, no skin discoloration, no clubbing, no cyanosis, positive findings:  edema + 2+ pitting edema in ankle B/L  NEURO: alert & oriented x 3 with fluent speech, no focal motor/sensory deficits   LABORATORY DATA: CBC    Component Value Date/Time   WBC 12.0* 02/08/2013 0559   RBC 3.24* 02/08/2013 0559   HGB 9.6* 02/08/2013 0559   HCT 29.7* 02/08/2013 0559   PLT 211 02/08/2013 0559   MCV 91.7 02/08/2013 0559   MCH 29.6 02/08/2013 0559   MCHC 32.3 02/08/2013 0559   RDW 23.4* 02/08/2013 0559   LYMPHSABS 1.2 02/07/2013 1232   MONOABS 1.0 02/07/2013 1232   EOSABS 0.0 02/07/2013 1232   BASOSABS 0.1 02/07/2013 1232      Chemistry      Component Value Date/Time   NA 134* 02/08/2013 0559   K 4.1 02/08/2013 0559   CL 104 02/08/2013 0559   CO2 21 02/08/2013 0559   BUN 30* 02/08/2013 0559   CREATININE 2.26* 02/08/2013 0559      Component Value Date/Time   CALCIUM 8.8 02/08/2013 0559   ALKPHOS 182* 02/07/2013 1232   AST 20 02/07/2013 1232   ALT 15 02/07/2013 1232   BILITOT 0.3 02/07/2013 1232        ASSESSMENT:  1. 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  2. Hydronephrosis of right kidney secondary to #1. S/P right double-J stent placement by Dr. Annabell Howells on 11/03/2012  3. Admission to [facility] from 12/12/2012- 12/15/2012 for presumed Sepsis-like picture. This prompted the addition of Neulasta to her treatment plan beginning with cycle 2 of chemotherapy.  4. Poorly controlled DM, followed by PCP  5. Iron deficiency anemia at time of presentation.   Patient Active Problem List   Diagnosis Date Noted  . UTI (urinary tract infection) 02/07/2013   . Leukocytosis, unspecified 02/07/2013  . Hypoglycemia 02/07/2013  . Chronic kidney disease (CKD), stage IV (severe) 12/13/2012  . Diabetes mellitus type 2 in obese 12/13/2012  . Hypertension 12/13/2012  . Iron deficiency anemia 11/21/2012  . 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 I personally reviewed and went over laboratory results with the patient.  2. I personally reviewed and went over radiographic studies with the patient.  3. Pre-chemotherapy labs as ordered: CBC diff, CMET, LDH, ESR  4. Restaging scans will be performed following completion of therapy. PET scan will be required due to poor renal function  5. Recommend follow-up with urologist for recurrent UTI and urgency incontinence 6. Continue follow-up with PCP 7. Return in 4 weeks as scheduled for follow-up.   THERAPY PLAN:  We will plan for 6 cycles of chemotherapy followed by a repeat PET scan.  At that time, we will consider a maintenance regimen.  All questions were answered. The patient knows to call the clinic with any problems, questions or concerns. We can certainly see the patient much sooner if necessary.  Patient and plan discussed with Dr. Benita Gutter and he is in agreement with the aforementioned.   [name] [name]

## 2013-02-23 ENCOUNTER — Ambulatory Visit (INDEPENDENT_AMBULATORY_CARE_PROVIDER_SITE_OTHER): Payer: Medicare Other | Admitting: Urology

## 2013-02-23 DIAGNOSIS — N3941 Urge incontinence: Secondary | ICD-10-CM

## 2013-02-23 DIAGNOSIS — R82998 Other abnormal findings in urine: Secondary | ICD-10-CM

## 2013-02-23 DIAGNOSIS — N135 Crossing vessel and stricture of ureter without hydronephrosis: Secondary | ICD-10-CM

## 2013-02-23 MED ORDER — ACETAMINOPHEN 325 MG PO TABS
ORAL_TABLET | ORAL | Status: AC
  Filled 2013-02-23: qty 2

## 2013-02-23 MED ORDER — DIPHENHYDRAMINE HCL 25 MG PO CAPS
ORAL_CAPSULE | ORAL | Status: AC
  Filled 2013-02-23: qty 2

## 2013-02-27 ENCOUNTER — Encounter (HOSPITAL_BASED_OUTPATIENT_CLINIC_OR_DEPARTMENT_OTHER): Payer: Medicare Other

## 2013-02-27 VITALS — BP 149/52 | HR 93 | Temp 98.2°F | Resp 20 | Wt 222.8 lb

## 2013-02-27 DIAGNOSIS — Z452 Encounter for adjustment and management of vascular access device: Secondary | ICD-10-CM

## 2013-02-27 DIAGNOSIS — Z5111 Encounter for antineoplastic chemotherapy: Secondary | ICD-10-CM

## 2013-02-27 DIAGNOSIS — C8290 Follicular lymphoma, unspecified, unspecified site: Secondary | ICD-10-CM

## 2013-02-27 DIAGNOSIS — C8293 Follicular lymphoma, unspecified, intra-abdominal lymph nodes: Secondary | ICD-10-CM

## 2013-02-27 LAB — CBC WITH DIFFERENTIAL/PLATELET
Basophils Relative: 0 % (ref 0–1)
HCT: 28.4 % — ABNORMAL LOW (ref 36.0–46.0)
Hemoglobin: 9.1 g/dL — ABNORMAL LOW (ref 12.0–15.0)
Lymphs Abs: 1.9 10*3/uL (ref 0.7–4.0)
MCHC: 32 g/dL (ref 30.0–36.0)
Monocytes Absolute: 0.7 10*3/uL (ref 0.1–1.0)
Monocytes Relative: 12 % (ref 3–12)
Neutro Abs: 3 10*3/uL (ref 1.7–7.7)
RBC: 2.9 MIL/uL — ABNORMAL LOW (ref 3.87–5.11)

## 2013-02-27 LAB — COMPREHENSIVE METABOLIC PANEL
Albumin: 2.8 g/dL — ABNORMAL LOW (ref 3.5–5.2)
Alkaline Phosphatase: 101 U/L (ref 39–117)
BUN: 23 mg/dL (ref 6–23)
CO2: 24 mEq/L (ref 19–32)
Chloride: 100 mEq/L (ref 96–112)
Creatinine, Ser: 2.46 mg/dL — ABNORMAL HIGH (ref 0.50–1.10)
GFR calc Af Amer: 21 mL/min — ABNORMAL LOW (ref >90)
GFR calc non Af Amer: 18 mL/min — ABNORMAL LOW (ref >90)
Glucose, Bld: 252 mg/dL — ABNORMAL HIGH (ref 70–99)
Potassium: 4.3 mEq/L (ref 3.5–5.1)
Total Bilirubin: 0.3 mg/dL (ref 0.3–1.2)

## 2013-02-27 LAB — LACTATE DEHYDROGENASE: LDH: 166 U/L (ref 94–250)

## 2013-02-27 LAB — SEDIMENTATION RATE: Sed Rate: 56 mm/hr — ABNORMAL HIGH (ref 0–22)

## 2013-02-27 MED ORDER — ALTEPLASE 2 MG IJ SOLR
2.0000 mg | Freq: Once | INTRAMUSCULAR | Status: AC | PRN
  Administered 2013-02-27: 2 mg
  Filled 2013-02-27: qty 2

## 2013-02-27 MED ORDER — DIPHENHYDRAMINE HCL 25 MG PO CAPS: 50.0000 mg | ORAL_CAPSULE | Freq: Once | ORAL | Status: DC

## 2013-02-27 MED ORDER — ACETAMINOPHEN 325 MG PO TABS: 650.0000 mg | ORAL_TABLET | Freq: Once | ORAL | Status: DC

## 2013-02-27 MED ORDER — ALTEPLASE 2 MG IJ SOLR
INTRAMUSCULAR | Status: AC
  Filled 2013-02-27: qty 2

## 2013-02-27 MED ORDER — SODIUM CHLORIDE 0.9 % IV SOLN
Freq: Once | INTRAVENOUS | Status: AC
  Administered 2013-02-27: 8 mg via INTRAVENOUS
  Filled 2013-02-27: qty 4

## 2013-02-27 MED ORDER — SODIUM CHLORIDE 0.9 % IV SOLN
90.0000 mg/m2 | Freq: Once | INTRAVENOUS | Status: AC
  Administered 2013-02-27: 200 mg via INTRAVENOUS
  Filled 2013-02-27: qty 40

## 2013-02-27 MED ORDER — SODIUM CHLORIDE 0.9 % IV SOLN
Freq: Once | INTRAVENOUS | Status: AC
  Administered 2013-02-27: 11:00:00 via INTRAVENOUS

## 2013-02-27 MED ORDER — SODIUM CHLORIDE 0.9 % IV SOLN
375.0000 mg/m2 | Freq: Once | INTRAVENOUS | Status: AC
  Administered 2013-02-27: 800 mg via INTRAVENOUS
  Filled 2013-02-27: qty 80

## 2013-02-27 NOTE — Progress Notes (Signed)
1030 Patient reports taking 2 Tylenol PM at home prior to appointment today. Alteplase 2 cc instill into port at end of infusions. Dressing to cover port access labeled DO NOT FLUSH! Alteplase instilled at 1600 on 02/27/13.

## 2013-02-28 ENCOUNTER — Encounter (HOSPITAL_BASED_OUTPATIENT_CLINIC_OR_DEPARTMENT_OTHER): Payer: Medicare Other

## 2013-02-28 VITALS — BP 141/56 | HR 87 | Temp 97.8°F | Resp 18

## 2013-02-28 DIAGNOSIS — C8290 Follicular lymphoma, unspecified, unspecified site: Secondary | ICD-10-CM

## 2013-02-28 DIAGNOSIS — C8299 Follicular lymphoma, unspecified, extranodal and solid organ sites: Secondary | ICD-10-CM

## 2013-02-28 DIAGNOSIS — Z5111 Encounter for antineoplastic chemotherapy: Secondary | ICD-10-CM

## 2013-02-28 MED ORDER — SODIUM CHLORIDE 0.9 % IV SOLN
90.0000 mg/m2 | Freq: Once | INTRAVENOUS | Status: AC
  Administered 2013-02-28: 200 mg via INTRAVENOUS
  Filled 2013-02-28: qty 40

## 2013-02-28 MED ORDER — HEPARIN SOD (PORK) LOCK FLUSH 100 UNIT/ML IV SOLN
500.0000 [IU] | Freq: Once | INTRAVENOUS | Status: AC | PRN
  Administered 2013-02-28: 500 [IU]
  Filled 2013-02-28: qty 5

## 2013-02-28 MED ORDER — SODIUM CHLORIDE 0.9 % IV SOLN
Freq: Once | INTRAVENOUS | Status: AC
  Administered 2013-02-28: 8 mg via INTRAVENOUS
  Filled 2013-02-28: qty 4

## 2013-02-28 MED ORDER — SODIUM CHLORIDE 0.9 % IV SOLN
Freq: Once | INTRAVENOUS | Status: AC
  Administered 2013-02-28: 10:00:00 via INTRAVENOUS

## 2013-02-28 MED ORDER — SODIUM CHLORIDE 0.9 % IJ SOLN
10.0000 mL | INTRAMUSCULAR | Status: DC | PRN
  Administered 2013-02-28: 10 mL
  Filled 2013-02-28: qty 10

## 2013-02-28 MED ORDER — HEPARIN SOD (PORK) LOCK FLUSH 100 UNIT/ML IV SOLN
INTRAVENOUS | Status: AC
  Filled 2013-02-28: qty 5

## 2013-02-28 NOTE — Progress Notes (Signed)
Easily able to withdraw 10 cc blood from port after Alteplase instilled after therapy yesterday.

## 2013-03-01 ENCOUNTER — Encounter (HOSPITAL_BASED_OUTPATIENT_CLINIC_OR_DEPARTMENT_OTHER): Payer: Medicare Other

## 2013-03-01 VITALS — BP 133/50 | HR 51 | Temp 97.5°F | Resp 18

## 2013-03-01 DIAGNOSIS — C8290 Follicular lymphoma, unspecified, unspecified site: Secondary | ICD-10-CM

## 2013-03-01 DIAGNOSIS — C8299 Follicular lymphoma, unspecified, extranodal and solid organ sites: Secondary | ICD-10-CM

## 2013-03-01 DIAGNOSIS — Z5189 Encounter for other specified aftercare: Secondary | ICD-10-CM

## 2013-03-01 MED ORDER — PEGFILGRASTIM INJECTION 6 MG/0.6ML
6.0000 mg | Freq: Once | SUBCUTANEOUS | Status: AC
  Administered 2013-03-01: 6 mg via SUBCUTANEOUS

## 2013-03-01 MED ORDER — PEGFILGRASTIM INJECTION 6 MG/0.6ML
SUBCUTANEOUS | Status: AC
  Filled 2013-03-01: qty 0.6

## 2013-03-01 NOTE — Progress Notes (Signed)
[name] [name] presents today for injection per MD orders. Tolerated chemo without problems. Denies nausea or vomiting. Chief c/o shakiness and minor hiccoughs.\[id number]\  Neulasta 6mg  administered SQ in left Abdomen. Administration without incident. Patient tolerated well.

## 2013-03-02 ENCOUNTER — Other Ambulatory Visit: Payer: Self-pay | Admitting: Urology

## 2013-03-13 ENCOUNTER — Encounter (HOSPITAL_COMMUNITY): Payer: Self-pay | Admitting: Pharmacy Technician

## 2013-03-15 NOTE — Patient Instructions (Addendum)
SHERON TALLMAN  03/15/2013   Your procedure is scheduled on:   03/23/2013  Report to [facility] at 1030  AM.  Call this number if you have problems the morning of surgery: [phone]   Remember:   Do not eat food or drink liquids after midnight.   Take these medicines the morning of surgery with A SIP OF WATER: allopurinol, decadron, zofran  Do not wear jewelry, make-up or nail polish.  Do not wear lotions, powders, or perfumes.   Do not shave 48 hours prior to surgery. Men may shave face and neck.  Do not bring valuables to the hospital.  [facility] is not responsible  for any belongings or valuables.  Contacts, dentures or bridgework may not be worn into surgery.  Leave suitcase in the car. After surgery it may be brought to your room.  For patients admitted to the hospital, checkout time is 11:00 AM the day of discharge.   Patients discharged the day of surgery will not be allowed to drive home.  Name and phone number of your driver: family  Special Instructions: Shower using CHG 2 nights before surgery and the night before surgery.  If you shower the day of surgery use CHG.  Use special wash - you have one bottle of CHG for all showers.  You should use approximately 1/3 of the bottle for each shower.   Please read over the following fact sheets that you were given: Pain Booklet, Coughing and Deep Breathing, Surgical Site Infection Prevention, Anesthesia Post-op Instructions and Care and Recovery After Surgery Cystoscopy Cystoscopy is a procedure that is used to help your caregiver diagnose and sometimes treat conditions that affect your lower urinary tract. Your lower urinary tract includes your bladder and the tube through which urine passes from your bladder out of your body (urethra). Cystoscopy is performed with a thin, tube-shaped instrument (cystoscope). The cystoscope has lenses and a light at the end so that your caregiver can see inside your bladder. The cystoscope is  inserted at the entrance of your urethra. Your caregiver guides it through your urethra and into your bladder. There are two main types of cystoscopy:  Flexible cystoscopy (with a flexible cystoscope).  Rigid cystoscopy (with a rigid cystoscope). Cystoscopy may be recommended for many conditions, including:  Urinary tract infections.  Blood in your urine (hematuria).  Loss of bladder control (urinary incontinence) or overactive bladder.  Unusual cells found in a urine sample.  Urinary blockage.  Painful urination. Cystoscopy may also be done to remove a sample of your tissue to be checked under a microscope (biopsy). It may also be done to remove or destroy bladder stones. LET YOUR CAREGIVER KNOW ABOUT:  Allergies to food or medicine.  Medicines taken, including vitamins, herbs, eyedrops, over-the-counter medicines, and creams.  Use of steroids (by mouth or creams).  Previous problems with anesthetics or numbing medicines.  History of bleeding problems or blood clots.  Previous surgery.  Other health problems, including diabetes and kidney problems.  Possibility of pregnancy, if this applies. PROCEDURE The area around the opening to your urethra will be cleaned. A medicine to numb your urethra (local anesthetic) is used. If a tissue sample or stone is removed during the procedure, you may be given a medicine to make you sleep (general anesthetic). Your caregiver will gently insert the tip of the cystoscope into your urethra. The cystoscope will be slowly glided through your urethra and into your bladder. Sterile  fluid will flow through the cystoscope and into your bladder. The fluid will expand and stretch your bladder. This gives your caregiver a better view of your bladder walls. The procedure lasts about 15 20 minutes. AFTER THE PROCEDURE If a local anesthetic is used, you will be allowed to go home as soon as you are ready. If a general anesthetic is used, you will be  taken to a recovery area until you are stable. You may have temporary bleeding and burning on urination. Document Released: 06/25/2000 Document Revised: 03/22/2012 Document Reviewed: 12/20/2011 [facility] Patient Information 2014 [place], [place]. PATIENT INSTRUCTIONS POST-ANESTHESIA  IMMEDIATELY FOLLOWING SURGERY:  Do not drive or operate machinery for the first twenty four hours after surgery.  Do not make any important decisions for twenty four hours after surgery or while taking narcotic pain medications or sedatives.  If you develop intractable nausea and vomiting or a severe headache please notify your doctor immediately.  FOLLOW-UP:  Please make an appointment with your surgeon as instructed. You do not need to follow up with anesthesia unless specifically instructed to do so.  WOUND CARE INSTRUCTIONS (if applicable):  Keep a dry clean dressing on the anesthesia/puncture wound site if there is drainage.  Once the wound has quit draining you may leave it open to air.  Generally you should leave the bandage intact for twenty four hours unless there is drainage.  If the epidural site drains for more than 36-48 hours please call the anesthesia department.  QUESTIONS?:  Please feel free to call your physician or the hospital operator if you have any questions, and they will be happy to assist you.

## 2013-03-16 ENCOUNTER — Encounter (HOSPITAL_COMMUNITY)
Admission: RE | Admit: 2013-03-16 | Discharge: 2013-03-16 | Disposition: A | Discharge status: Home / Self Care | Payer: Medicare Other | Source: Ambulatory Visit | Attending: Urology | Admitting: Urology

## 2013-03-16 ENCOUNTER — Encounter (HOSPITAL_COMMUNITY): Payer: Self-pay

## 2013-03-16 DIAGNOSIS — Z01812 Encounter for preprocedural laboratory examination: Secondary | ICD-10-CM | POA: Insufficient documentation

## 2013-03-16 LAB — BASIC METABOLIC PANEL
BUN: 26 mg/dL — ABNORMAL HIGH (ref 6–23)
CO2: 24 mEq/L (ref 19–32)
Calcium: 9.9 mg/dL (ref 8.4–10.5)
Creatinine, Ser: 2.23 mg/dL — ABNORMAL HIGH (ref 0.50–1.10)
GFR calc non Af Amer: 21 mL/min — ABNORMAL LOW (ref >90)
Glucose, Bld: 287 mg/dL — ABNORMAL HIGH (ref 70–99)

## 2013-03-20 NOTE — H&P (Signed)
lems 1. Chronic Renal Failure 585.9 2. CT Scan Retroperitoneum Soft Tissue Mass 3. Hydronephrosis On The Right 591 4. 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 Pyuria 791.9 6. Urge Incontinence Of Urine 788.31 7. 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 Dalesandro returns today in f/u.  she had a right stent placement for hydro from a retroperitoneal non-hodgkin's lymphoma in 4/14.   She has been getting treatment for the lymphoma and has 3 treatments left.  She has some terminal dysuria and she has urgency  with incontinence.  She has nocturia q1hr.  She has a possible UTI today.   Her most recent CT was on 7/30 and the mass was smaller at 8.6cm which is down from 10.5cm.  The stent was in good position and the hydronephrosis had improved.    Her most recent Cr in 7/14 was 2.25.  She hasn't had that much benefit from the stent from a renal function standpoint.   A urine culture on 7/30 for [facility] in the urine had multiple species.  She doesn't appear to have had any issues with neutropenia.   Past Medical History 1. History of  Arthritis V13.4 2. History of  Diabetes Mellitus 250.00 3. History of  Hypercholesterolemia 272.0 4. History of  Hypertension 401.9  Surgical History 1. History of  Cystoscopy With Insertion Of Ureteral Stent Right 2. History of  Thyroid Surgery Substernal Thyroidectomy Partial  Current Meds 1. Acetaminophen CAPS; Therapy: (Recorded:15Aug2014) to 2. Acyclovir 400 MG Oral Tablet; Therapy: (Recorded:15Aug2014) to 3. Allopurinol 300 MG Oral Tablet; Therapy: (Recorded:15Aug2014) to 4. Dexamethasone 4 MG Oral Tablet; Therapy: (Recorded:15Aug2014) to 5. Furosemide 20 MG Oral Tablet; Therapy: (Recorded:02Dec2008) to 6. GlipiZIDE 10 MG Oral Tablet; Therapy: (Recorded:15Aug2014) to 7. Lipitor 40 MG Oral Tablet; Therapy: (Recorded:02Dec2008) to 8. LORazepam 0.5 MG Oral Tablet; Therapy: (Recorded:15Aug2014)  to 9. Multiple Vitamin TABS; Therapy: (Recorded:15Aug2014) to 10. Ondansetron 8 MG Oral Tablet Dispersible; Therapy: (Recorded:15Aug2014) to 11. Phenazopyridine HCl 200 MG Oral Tablet; TAKE 1 TABLET BY MOUTH 3 TIMES A DAY AS   NEEDED; Therapy: 09May2014 to (Evaluate:08Jun2014)  Requested for: 09May2014; Last   Rx:09May2014  Allergies 1. No Known Drug Allergies  Family History 1. Family history of  Coronary Artery Disease 2. Family history of  Death In The Family Father 3. Family history of  Death In The Family Mother 4. Family history of  Family Health Status Number Of Children 5. Family history of  Lung Cancer V16.1  Social History 1. Caffeine Use 2. Marital History - Currently Married 3. Never A Smoker 4. Occupation: Retired Nurse, children's  5. Alcohol Use 6. Tobacco Use   Past and social history reviewed and updated.   Review of Systems  Genitourinary: urinary urgency, dysuria and incontinence.  Constitutional: night sweats, but no fever.  Cardiovascular: no chest pain.  Respiratory: no shortness of breath.    Vitals Vital Signs [Data Includes: Last 1 Day]  15Aug2014 10:09AM  Blood Pressure: 117 / 69 Temperature: 97.9 F Heart Rate: 97  Physical Exam Constitutional: Well nourished and well developed . No acute distress.  Pulmonary: No respiratory distress and normal respiratory rhythm and effort.  Cardiovascular: Heart rate and rhythm are normal . No peripheral edema.  Abdomen: The abdomen is obese. Mild tenderness in the RLQ is present. mild right CVA tenderness.    Results/Data Urine [Data Includes: Last 1 Day]   15Aug2014  COLOR YELLOW   APPEARANCE CLOUDY   SPECIFIC GRAVITY 1.015  pH 6.5   GLUCOSE NEG mg/dL  BILIRUBIN NEG   KETONE NEG mg/dL  BLOOD SMALL   PROTEIN 100 mg/dL  UROBILINOGEN 0.2 mg/dL  NITRITE NEG   LEUKOCYTE ESTERASE LARGE   SQUAMOUS EPITHELIAL/HPF RARE   WBC TNTC WBC/hpf  RBC 3-6 RBC/hpf  BACTERIA FEW   CRYSTALS NONE SEEN   CASTS NONE  SEEN    Old records or history reviewed: I have reviewed notes from [place] from 7/30.  The following images/tracing/specimen were independently visualized:  I have reviewed her recent CT films and report.  The following clinical lab reports were reviewed:  I have reviewed her recent labs and cultures. Her cultures have been negative or multiple species.    Assessment 1. Chronic Renal Failure 585.9 2. 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 Urge Incontinence Of Urine 788.31 4. Hydronephrosis On The Right 591   She has stable CRI despite stenting. The lymphoma has regressed some on therapy but not resolved. She has pyuria with irritative symptoms and incontience but her cultures have been negative and I think this is from the stent.   Plan Urge Incontinence Of Urine (788.31)  1. VESIcare 5 MG Oral Tablet; Take 1 tablet daily; Therapy: 15Aug2014 to (Evaluate:12Sep2014);  Last Rx:15Aug2014 2. UA With REFLEX  Done: 15Aug2014 09:52AM   Urine culture today. I will try her on Vesicare for the UUI.  I have reviewed the side effects.  She needs her stent changes and I will set that up in the near future.  The risks were reviewed.   Discussion/Summary  CC: Dellis Anes PA and Dr. Ardeen Garland.

## 2013-03-20 NOTE — Pre-Procedure Instructions (Signed)
Dr Jayme Cloud aware of abnormal labs, no orders given.

## 2013-03-22 ENCOUNTER — Encounter (HOSPITAL_COMMUNITY): Payer: Self-pay

## 2013-03-22 ENCOUNTER — Encounter (HOSPITAL_COMMUNITY): Payer: Medicare Other | Attending: Oncology

## 2013-03-22 VITALS — BP 137/72 | HR 92 | Temp 98.6°F | Resp 18 | Wt 221.5 lb

## 2013-03-22 DIAGNOSIS — C8589 Other specified types of non-Hodgkin lymphoma, extranodal and solid organ sites: Secondary | ICD-10-CM

## 2013-03-22 DIAGNOSIS — C8299 Follicular lymphoma, unspecified, extranodal and solid organ sites: Secondary | ICD-10-CM | POA: Insufficient documentation

## 2013-03-22 DIAGNOSIS — C8290 Follicular lymphoma, unspecified, unspecified site: Secondary | ICD-10-CM

## 2013-03-22 NOTE — Patient Instructions (Addendum)
[facility] Discharge Instructions  RECOMMENDATIONS MADE BY THE CONSULTANT AND ANY TEST RESULTS WILL BE SENT TO YOUR REFERRING PHYSICIAN.  EXAM FINDINGS BY THE PHYSICIAN TODAY AND SIGNS OR SYMPTOMS TO REPORT TO CLINIC OR PRIMARY PHYSICIAN: Exam and discussion by Dr. Sharia Reeve.  You are doing better.  We will continue with your chemotherapy and will recheck your scans a few weeks after you are finished with therapy.  MEDICATIONS PRESCRIBED:  none  INSTRUCTIONS GIVEN AND DISCUSSED: Report uncontrolled nausea, vomiting, pain, increased shortness of breath or other problems.  SPECIAL INSTRUCTIONS/FOLLOW-UP: Follow-up in October.  Thank you for choosing [facility] to provide your oncology and hematology care.  To afford each patient quality time with our providers, please arrive at least 15 minutes before your scheduled appointment time.  With your help, our goal is to use those 15 minutes to complete the necessary work-up to ensure our physicians have the information they need to help with your evaluation and healthcare recommendations.    Effective January 1st, 2014, we ask that you re-schedule your appointment with our physicians should you arrive 10 or more minutes late for your appointment.  We strive to give you quality time with our providers, and arriving late affects you and other patients whose appointments are after yours.    Again, thank you for choosing [facility].  Our hope is that these requests will decrease the amount of time that you wait before being seen by our physicians.       _____________________________________________________________  Should you have questions after your visit to [facility], please contact our office at [phone] between the hours of 8:30 a.m. and 5:00 p.m.  Voicemails left after 4:30 p.m. will not be returned until the following business day.  For prescription refill requests, have your  pharmacy contact our office with your prescription refill request.

## 2013-03-22 NOTE — Progress Notes (Signed)
[facility] [phone]   [phone]  OFFICE PROGRESS NOTE  [name] Hector, MD [address] [place] [address]  DIAGNOSIS: Non-Hodgkin's lymphoma, follicular center cell type. Grade indeterminate.  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 Diagnosis was made on 10/11/2012.  I don't see any bone morrow  aspiration and biopsy report . She began chemotherapy on 11/28/2012 and as at 03/01/2013 patient has successfully completed 4 cycles of treatment with Bendamustine / Rituximab. She tells me that initiated she had many side effects such as nausea , vomiting and  Diarrhea but these subsequently  these results or improved.  She denies any rash.  Presently she is eating well denies  fever or night sweats or lymphadenopathy. She was accompanied by her husband.  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 Previously this measured 10.7 x 6.5 cm"  He has chronic low back pain which is essentially unchanged.  She also has right hydronephrosis and ureteral stent and scheduled to see our urologist tomorrow. Pre admit note reviewed. She also does have Urge incontinence of urine.  She returns to the clinic today for scheduled follow up today.  She is scheduled for her cycle #5 of chemotherapy tomorrow.   MEDICAL HISTORY: Past Medical History  Diagnosis Date  . Hypertension   . Diabetes mellitus, type 2   . Hyperlipidemia   . Iron deficiency anemia 11/21/2012    At time of presentation.  Feraheme 1020 mg on 11/21/12.  . Follicular lymphoma  11/20/2012    Right ureteral obstruction by lymphadenopathy-> right hydronephrosis    ALLERGIES:  has No Known Allergies.  MEDICATIONS:  Current Outpatient Prescriptions  Medication Sig Dispense Refill  . acetaminophen (TYLENOL) 325 MG tablet Take 650 mg by mouth every 6 (six) hours as needed for pain.      Marland Kitchen acyclovir (ZOVIRAX) 400 MG tablet Take 1 tablet (400 mg total) by mouth daily.  30 tablet  3  . allopurinol (ZYLOPRIM) 300 MG tablet Take 1 tablet (300 mg total) by mouth daily.  30 tablet  3  . atorvastatin (LIPITOR) 80 MG tablet Take 80 mg by mouth every morning.      Marland Kitchen dexamethasone (DECADRON) 4 MG tablet Take 4 mg by mouth 2 (two) times daily with a meal. Take daily starting the day after chemotherapy for 2 days. Take with food.      . insulin detemir (LEVEMIR) 100 UNIT/ML injection Inject 0.3 mLs (30 Units total) into the skin 2 (two) times daily.  10 mL  12  . lidocaine-prilocaine (EMLA) cream Apply 1 application topically as needed. 1 hour prior to Chemo      . Multiple Vitamin (MULTIVITAMIN WITH MINERALS) TABS Take 1 tablet by mouth daily.      . ondansetron (ZOFRAN) 8 MG tablet Take 1 tablet (8 mg total) by mouth 2 (two) times daily. Take two times a day starting the day after chemo for 2 days. Then take two times a day as needed for nausea or vomiting.  30 tablet  1  . pegfilgrastim (NEULASTA) 6 MG/0.6ML injection Inject 6 mg into the skin every 21 (  twenty-one) days.      . phenazopyridine (PYRIDIUM) 200 MG tablet Take 200 mg by mouth 3 (three) times daily as needed for pain.      Marland Kitchen PRESCRIPTION MEDICATION Inject 1 application into the vein See admin instructions. Pt has chemo tx done once a month on Tuesday and Wednesday. Pt will be due next week for. Pt is follow by Dr. Dellis Anes      . sulfamethoxazole-trimethoprim (BACTRIM DS,SEPTRA DS) 800-160 MG per tablet Take 1 tablet by mouth daily. Take one tablet on Monday-Wednesday-and Friday for 6 months       No current  facility-administered medications for this visit.    SURGICAL HISTORY:  Past Surgical History  Procedure Laterality Date  . Cataract extraction    . Cystoscopy w/ ureteral stent placement Right 11/03/2012    Procedure: CYSTOSCOPY WITH RETROGRADE PYELOGRAM/URETERAL STENT PLACEMENT;  Surgeon: Anner Crete, MD;  Location: [facility];  Service: Urology;  Laterality: Right;  . Portacath placement Left 11/03/2012    Procedure: INSERTION PORT-A-CATH;  Surgeon: Marlane Hatcher, MD;  Location: [facility];  Service: General;  Laterality: Left;  Insertion Port-A-Cath Left Subclavian  . Appendectomy       REVIEW OF SYSTEMS: 14 point review of system is as in the history above otherwise negative.  PHYSICAL EXAMINATION:  Blood pressure 137/72, pulse 92, temperature 98.6 F (37 C), temperature source Oral, resp. rate 18, weight 221 lb 8 oz (100.472 kg). GENERAL: No acute distress. Severely obese. SKIN:  No rashes or significant lesions . No ecchymosis or petechial rash. HEAD: Normocephalic, No masses, lesions, tenderness or abnormalities  EYES: Conjunctiva are pink and non-injected and no jaundice ENT: External ears normal ,lips, buccal mucosa, and tongue normal and mucous membranes are moist . No evidence of thrush. LYMPH: No palpable lymphadenopathy, in the neck, supraclavicular areas or axilla. No inguinal lymphadenopathy. LUNGS: Clear to auscultation , no crackles or wheezes HEART: regular rate & rhythm, no murmurs, no gallops, S1 normal and S2 normal and no S3. ABDOMEN: Abdomen soft, non-tender, no masses or organomegaly and no hepatosplenomegaly palpable MSK: No CVA tenderness and no significant tenderness on percussion of the back or rib cage. EXTREMITIES:bilateral 1-2+ pitting edema with chronic stasis changes especially in the lower half of the legs NEURO: Alert & oriented .     LABORATORY DATA: Lab Results  Component Value Date   WBC 5.7 02/27/2013   HGB 9.8* 03/16/2013   HCT 29.5* 03/16/2013    MCV 97.9 02/27/2013   PLT 211 02/27/2013      Chemistry      Component Value Date/Time   NA 133* 03/16/2013 1240   K 5.1 03/16/2013 1240   CL 99 03/16/2013 1240   CO2 24 03/16/2013 1240   BUN 26* 03/16/2013 1240   CREATININE 2.23* 03/16/2013 1240      Component Value Date/Time   CALCIUM 9.9 03/16/2013 1240   ALKPHOS 101 02/27/2013 0913   AST 15 02/27/2013 0913   ALT 11 02/27/2013 0913   BILITOT 0.3 02/27/2013 0913       RADIOGRAPHIC STUDIES: No results found.   ASSESSMENT:  Follicular lymphoma status post 4/6 cycles of bendamustine/Rituxan . She does have history of right hydronephrosis form obstruction the right ureter related to the cancer.  She seems to be tolerating treatment very well.  She has chronic back pain which is suspect is musculoskeletal.   PLAN:  1. Follow up with urology as scheduled. 2. Continue chemotherapy as scheduled.  Cycle 5 scheduled next week. 3. Return to clinic in 5 weeks which will be cycle #6/6 4. Repeat PET/CT scan in 6-8 weeks after completing chemotherapy to evaluate response.    All questions were satisfactorily answered. Patient knows to call if  any concern arises.  I spent more than 50 % counseling the patient face to face. The total time spent in the appointment was 30 minutes.   Sherral Hammers, MD FACP. Hematology/Oncology.

## 2013-03-23 ENCOUNTER — Other Ambulatory Visit: Payer: Self-pay | Admitting: Urology

## 2013-03-23 ENCOUNTER — Ambulatory Visit (HOSPITAL_COMMUNITY): Payer: Medicare Other

## 2013-03-23 ENCOUNTER — Encounter (HOSPITAL_COMMUNITY)
Admission: RE | Disposition: A | Discharge status: Home / Self Care | Payer: Medicare Other | Source: Ambulatory Visit | Attending: Urology

## 2013-03-23 ENCOUNTER — Encounter (HOSPITAL_COMMUNITY): Payer: Self-pay | Admitting: *Deleted

## 2013-03-23 ENCOUNTER — Ambulatory Visit (HOSPITAL_COMMUNITY)
Admission: RE | Admit: 2013-03-23 | Discharge: 2013-03-23 | Disposition: A | Discharge status: Home / Self Care | Payer: Medicare Other | Source: Ambulatory Visit | Attending: Urology | Admitting: Urology

## 2013-03-23 ENCOUNTER — Encounter (HOSPITAL_COMMUNITY): Payer: Self-pay | Admitting: Anesthesiology

## 2013-03-23 ENCOUNTER — Ambulatory Visit (HOSPITAL_COMMUNITY): Payer: Medicare Other | Admitting: Anesthesiology

## 2013-03-23 DIAGNOSIS — C8590 Non-Hodgkin lymphoma, unspecified, unspecified site: Secondary | ICD-10-CM

## 2013-03-23 DIAGNOSIS — Z01812 Encounter for preprocedural laboratory examination: Secondary | ICD-10-CM | POA: Insufficient documentation

## 2013-03-23 DIAGNOSIS — E119 Type 2 diabetes mellitus without complications: Secondary | ICD-10-CM | POA: Insufficient documentation

## 2013-03-23 DIAGNOSIS — Z466 Encounter for fitting and adjustment of urinary device: Secondary | ICD-10-CM | POA: Insufficient documentation

## 2013-03-23 DIAGNOSIS — C8589 Other specified types of non-Hodgkin lymphoma, extranodal and solid organ sites: Secondary | ICD-10-CM | POA: Insufficient documentation

## 2013-03-23 DIAGNOSIS — I10 Essential (primary) hypertension: Secondary | ICD-10-CM | POA: Insufficient documentation

## 2013-03-23 DIAGNOSIS — N1330 Unspecified hydronephrosis: Secondary | ICD-10-CM | POA: Insufficient documentation

## 2013-03-23 DIAGNOSIS — N135 Crossing vessel and stricture of ureter without hydronephrosis: Secondary | ICD-10-CM | POA: Insufficient documentation

## 2013-03-23 HISTORY — PX: CYSTOSCOPY W/ URETERAL STENT PLACEMENT: SHX1429

## 2013-03-23 LAB — GLUCOSE, CAPILLARY: Glucose-Capillary: 237 mg/dL — ABNORMAL HIGH (ref 70–99)

## 2013-03-23 SURGERY — CYSTOSCOPY, FLEXIBLE, WITH STENT REPLACEMENT
Anesthesia: Monitor Anesthesia Care | Site: Ureter | Laterality: Right | Wound class: Clean Contaminated

## 2013-03-23 MED ORDER — FENTANYL CITRATE 0.05 MG/ML IJ SOLN
25.0000 ug | INTRAMUSCULAR | Status: AC
  Administered 2013-03-23 (×2): 25 ug via INTRAVENOUS

## 2013-03-23 MED ORDER — MIDAZOLAM HCL 2 MG/2ML IJ SOLN
1.0000 mg | INTRAMUSCULAR | Status: DC | PRN
  Administered 2013-03-23: 2 mg via INTRAVENOUS

## 2013-03-23 MED ORDER — SODIUM CHLORIDE 0.9 % IR SOLN
Status: DC | PRN
  Administered 2013-03-23: 3000 mL

## 2013-03-23 MED ORDER — LACTATED RINGERS IV SOLN
INTRAVENOUS | Status: DC
  Administered 2013-03-23: 12:00:00 via INTRAVENOUS

## 2013-03-23 MED ORDER — OXYCODONE HCL 5 MG PO TABS: 5.0000 mg | ORAL_TABLET | ORAL | Status: DC | PRN

## 2013-03-23 MED ORDER — SODIUM CHLORIDE 0.9 % IJ SOLN: 3.0000 mL | INTRAMUSCULAR | Status: DC | PRN

## 2013-03-23 MED ORDER — ONDANSETRON HCL 4 MG/2ML IJ SOLN: 4.0000 mg | Freq: Four times a day (QID) | INTRAMUSCULAR | Status: DC | PRN

## 2013-03-23 MED ORDER — STERILE WATER FOR IRRIGATION IR SOLN
Status: DC | PRN
  Administered 2013-03-23: 1000 mL

## 2013-03-23 MED ORDER — FENTANYL CITRATE 0.05 MG/ML IJ SOLN
INTRAMUSCULAR | Status: AC
  Filled 2013-03-23: qty 2

## 2013-03-23 MED ORDER — CIPROFLOXACIN IN D5W 400 MG/200ML IV SOLN
INTRAVENOUS | Status: AC
  Filled 2013-03-23: qty 200

## 2013-03-23 MED ORDER — ACETAMINOPHEN 325 MG PO TABS: 650.0000 mg | ORAL_TABLET | ORAL | Status: DC | PRN

## 2013-03-23 MED ORDER — ACETAMINOPHEN 650 MG RE SUPP
650.0000 mg | RECTAL | Status: DC | PRN
  Filled 2013-03-23: qty 1

## 2013-03-23 MED ORDER — ONDANSETRON HCL 4 MG/2ML IJ SOLN: 4.0000 mg | Freq: Once | INTRAMUSCULAR | Status: DC | PRN

## 2013-03-23 MED ORDER — MIDAZOLAM HCL 2 MG/2ML IJ SOLN
INTRAMUSCULAR | Status: AC
  Filled 2013-03-23: qty 2

## 2013-03-23 MED ORDER — IOHEXOL 350 MG/ML SOLN
INTRAVENOUS | Status: DC | PRN
  Administered 2013-03-23: 50 mL via URETHRAL

## 2013-03-23 MED ORDER — SODIUM CHLORIDE 0.9 % IJ SOLN: 3.0000 mL | Freq: Two times a day (BID) | INTRAMUSCULAR | Status: DC

## 2013-03-23 MED ORDER — FENTANYL CITRATE 0.05 MG/ML IJ SOLN: 25.0000 ug | INTRAMUSCULAR | Status: DC | PRN

## 2013-03-23 MED ORDER — PROPOFOL INFUSION 10 MG/ML OPTIME
INTRAVENOUS | Status: DC | PRN
  Administered 2013-03-23: 30 ug/kg/min via INTRAVENOUS

## 2013-03-23 MED ORDER — CIPROFLOXACIN IN D5W 400 MG/200ML IV SOLN
400.0000 mg | INTRAVENOUS | Status: AC
  Administered 2013-03-23: 400 mg via INTRAVENOUS

## 2013-03-23 MED ORDER — DEXTROSE 5 % IV SOLN
INTRAVENOUS | Status: DC | PRN
  Administered 2013-03-23: 13:00:00 via INTRAVENOUS

## 2013-03-23 MED ORDER — SODIUM CHLORIDE 0.9 % IV SOLN: 250.0000 mL | INTRAVENOUS | Status: DC | PRN

## 2013-03-23 MED ORDER — PROPOFOL 10 MG/ML IV EMUL
INTRAVENOUS | Status: AC
  Filled 2013-03-23: qty 20

## 2013-03-23 SURGICAL SUPPLY — 21 items
0.9% SODIUM CHLORIDE IRRIGATION 3000ML IMPLANT
BAG DRAIN URO TABLE W/ADPT NS (DRAPE) ×2 IMPLANT
BAG DRN 8 ADPR NS SKTRN CSTL (DRAPE) ×1
BAG HAMPER (MISCELLANEOUS) ×2 IMPLANT
CATH URET 5FR 28IN OPEN ENDED (CATHETERS) ×1 IMPLANT
CLOTH BEACON ORANGE TIMEOUT ST (SAFETY) ×4 IMPLANT
GLOVE BIOGEL PI IND STRL 7.0 (GLOVE) IMPLANT
GLOVE BIOGEL PI INDICATOR 7.0 (GLOVE) ×2
GLOVE ECLIPSE 6.5 STRL STRAW (GLOVE) ×1 IMPLANT
GLOVE ECLIPSE 7.0 STRL STRAW (GLOVE) ×2 IMPLANT
GLOVE EXAM NITRILE LRG STRL (GLOVE) ×1 IMPLANT
GLOVE SURG SS PI 8.0 STRL IVOR (GLOVE) ×2 IMPLANT
GOWN STRL REIN XL XLG (GOWN DISPOSABLE) ×2 IMPLANT
GUIDEWIRE STR DUAL SENSOR (WIRE) ×2 IMPLANT
IV NS IRRIG 3000ML ARTHROMATIC (IV SOLUTION) ×1 IMPLANT
KIT ROOM TURNOVER AP CYSTO (KITS) ×2 IMPLANT
MANIFOLD NEPTUNE II (INSTRUMENTS) ×2 IMPLANT
PACK CYSTO (CUSTOM PROCEDURE TRAY) ×2 IMPLANT
PAD ARMBOARD 7.5X6 YLW CONV (MISCELLANEOUS) ×2 IMPLANT
STENT URET 6FRX24 CONTOUR (STENTS) ×1 IMPLANT
WATER STERILE IRR 1000ML POUR (IV SOLUTION) ×2 IMPLANT

## 2013-03-23 NOTE — Transfer of Care (Signed)
Immediate Anesthesia Transfer of Care Note  Patient: [name] [name]  Procedure(s) Performed: Procedure(s): CYSTOSCOPY WITH STENT REPLACEMENT (Right)  Patient Location: PACU  Anesthesia Type:MAC  Level of Consciousness: awake, alert  and patient cooperative  Airway & Oxygen Therapy: Patient Spontanous Breathing  Post-op Assessment: Report given to PACU RN, Post -op Vital signs reviewed and stable and Patient moving all extremities  Post vital signs: Reviewed and stable  Complications: No apparent anesthesia complications

## 2013-03-23 NOTE — Interval H&P Note (Signed)
History and Physical Interval Note:  03/23/2013 12:23 PM  [name] [name]  has presented today for surgery, with the diagnosis of right ureteral obstruction  The various methods of treatment have been discussed with the patient and family. After consideration of risks, benefits and other options for treatment, the patient has consented to  Procedure(s): CYSTOSCOPY WITH STENT REPLACEMENT (Right) as a surgical intervention .  The patient's history has been reviewed, patient examined, no change in status, stable for surgery.  I have reviewed the patient's chart and labs.  Questions were answered to the patient's satisfaction.     [name]

## 2013-03-23 NOTE — Anesthesia Postprocedure Evaluation (Signed)
  Anesthesia Post-op Note  Patient: [name] [name]  Procedure(s) Performed: Procedure(s): CYSTOSCOPY WITH STENT REPLACEMENT (Right)  Patient Location: PACU  Anesthesia Type:MAC  Level of Consciousness: awake, alert , oriented and patient cooperative  Airway and Oxygen Therapy: Patient Spontanous Breathing  Post-op Pain: none  Post-op Assessment: Post-op Vital signs reviewed, Patient's Cardiovascular Status Stable, Respiratory Function Stable, Patent Airway, No signs of Nausea or vomiting and Pain level controlled  Post-op Vital Signs: Reviewed and stable  Complications: No apparent anesthesia complications

## 2013-03-23 NOTE — Anesthesia Preprocedure Evaluation (Signed)
Anesthesia Evaluation  Patient identified by MRN, date of birth, ID band Patient awake    Reviewed: Allergy & Precautions, H&P , NPO status , Patient's Chart, lab work & pertinent test results  Airway Mallampati: III TM Distance: >3 FB Neck ROM: Full  Mouth opening: Limited Mouth Opening  Dental  (+) Edentulous Upper and Edentulous Lower   Pulmonary neg pulmonary ROS,  breath sounds clear to auscultation        Cardiovascular hypertension, Rhythm:Regular Rate:Normal     Neuro/Psych    GI/Hepatic   Endo/Other  diabetes, Well Controlled, Type 2, Oral Hypoglycemic AgentsMorbid obesity  Renal/GU      Musculoskeletal   Abdominal   Peds  Hematology   Anesthesia Other Findings   Reproductive/Obstetrics                           Anesthesia Physical Anesthesia Plan  ASA: III  Anesthesia Plan: MAC   Post-op Pain Management:    Induction: Intravenous  Airway Management Planned: Simple Face Mask  Additional Equipment:   Intra-op Plan:   Post-operative Plan:   Informed Consent: I have reviewed the patients History and Physical, chart, labs and discussed the procedure including the risks, benefits and alternatives for the proposed anesthesia with the patient or authorized representative who has indicated his/her understanding and acceptance.     Plan Discussed with:   Anesthesia Plan Comments:         Anesthesia Quick Evaluation

## 2013-03-23 NOTE — Op Note (Signed)
Procedure: Cystoscopy with removal and replacement of a right JJ stent.  Preop Dx: Right ureteral obstruction from lymphoma.  Postop Dx: Same.  Surgeon:  Bjorn Pippin MD  Anesth: MAC.  Drain 6x24 right JJ stent.  Comp:  None.  Indications:  72 yo WF with lymphoma and right ureteral obstruction managed with stenting.   It is time for exchange.  Procedure:   She was given Cipro and was taken to the OR where she was fitted with PAS hose and placed in the lithotomy position.   MAC was given as needed.  She was prepped with betadine and draped in the usual sterile fashion.   Cystoscopy was done with the 5fr scope and 12 deg lens.   She had a stent at the right UA.  There was erythema of the bladder wall from the stent.   The stent was removed with graspers to the urethral meatus.   A sensor wire was passed to the kidney through the right stent which was then removed.   A fresh 6x24 JJ Contour stent was passed to the kidney under flouroscopic guidance and the wire was removed leaving the stent in good position.   The bladder was drained and she was moved to the recovery room in stable condition.

## 2013-03-26 ENCOUNTER — Encounter (HOSPITAL_COMMUNITY): Payer: Self-pay | Admitting: Urology

## 2013-03-26 MED FILL — Sodium Chloride Irrigation Soln 0.9%: Qty: 3000 | Status: AC

## 2013-03-27 ENCOUNTER — Encounter (HOSPITAL_BASED_OUTPATIENT_CLINIC_OR_DEPARTMENT_OTHER): Payer: Medicare Other

## 2013-03-27 VITALS — BP 150/58 | HR 87 | Temp 98.0°F | Resp 18 | Wt 220.4 lb

## 2013-03-27 DIAGNOSIS — Z5111 Encounter for antineoplastic chemotherapy: Secondary | ICD-10-CM

## 2013-03-27 DIAGNOSIS — C8290 Follicular lymphoma, unspecified, unspecified site: Secondary | ICD-10-CM

## 2013-03-27 DIAGNOSIS — C8589 Other specified types of non-Hodgkin lymphoma, extranodal and solid organ sites: Secondary | ICD-10-CM

## 2013-03-27 DIAGNOSIS — Z5112 Encounter for antineoplastic immunotherapy: Secondary | ICD-10-CM

## 2013-03-27 LAB — COMPREHENSIVE METABOLIC PANEL
AST: 19 U/L (ref 0–37)
Albumin: 3.1 g/dL — ABNORMAL LOW (ref 3.5–5.2)
Calcium: 9.2 mg/dL (ref 8.4–10.5)
Creatinine, Ser: 2.05 mg/dL — ABNORMAL HIGH (ref 0.50–1.10)

## 2013-03-27 LAB — CBC WITH DIFFERENTIAL/PLATELET
Basophils Absolute: 0 10*3/uL (ref 0.0–0.1)
Basophils Relative: 1 % (ref 0–1)
Eosinophils Relative: 4 % (ref 0–5)
HCT: 28.1 % — ABNORMAL LOW (ref 36.0–46.0)
MCHC: 32.4 g/dL (ref 30.0–36.0)
MCV: 101.1 fL — ABNORMAL HIGH (ref 78.0–100.0)
Monocytes Absolute: 0.1 10*3/uL (ref 0.1–1.0)
Neutro Abs: 2.5 10*3/uL (ref 1.7–7.7)
Platelets: 158 10*3/uL (ref 150–400)
RDW: 16.9 % — ABNORMAL HIGH (ref 11.5–15.5)

## 2013-03-27 LAB — SEDIMENTATION RATE: Sed Rate: 74 mm/hr — ABNORMAL HIGH (ref 0–22)

## 2013-03-27 MED ORDER — HEPARIN SOD (PORK) LOCK FLUSH 100 UNIT/ML IV SOLN
500.0000 [IU] | Freq: Once | INTRAVENOUS | Status: AC | PRN
  Administered 2013-03-27: 500 [IU]
  Filled 2013-03-27: qty 5

## 2013-03-27 MED ORDER — SODIUM CHLORIDE 0.9 % IV SOLN
Freq: Once | INTRAVENOUS | Status: AC
  Administered 2013-03-27: 12:00:00 via INTRAVENOUS

## 2013-03-27 MED ORDER — DIPHENHYDRAMINE HCL 25 MG PO CAPS
ORAL_CAPSULE | ORAL | Status: AC
  Filled 2013-03-27: qty 2

## 2013-03-27 MED ORDER — DIPHENHYDRAMINE HCL 25 MG PO CAPS
50.0000 mg | ORAL_CAPSULE | Freq: Once | ORAL | Status: AC
  Administered 2013-03-27: 50 mg via ORAL

## 2013-03-27 MED ORDER — SODIUM CHLORIDE 0.9 % IV SOLN
90.0000 mg/m2 | Freq: Once | INTRAVENOUS | Status: AC
  Administered 2013-03-27: 200 mg via INTRAVENOUS
  Filled 2013-03-27: qty 40

## 2013-03-27 MED ORDER — SODIUM CHLORIDE 0.9 % IJ SOLN
10.0000 mL | INTRAMUSCULAR | Status: DC | PRN
  Administered 2013-03-27: 10 mL
  Filled 2013-03-27: qty 10

## 2013-03-27 MED ORDER — SODIUM CHLORIDE 0.9 % IV SOLN
375.0000 mg/m2 | Freq: Once | INTRAVENOUS | Status: AC
  Administered 2013-03-27: 800 mg via INTRAVENOUS
  Filled 2013-03-27: qty 80

## 2013-03-27 MED ORDER — HEPARIN SOD (PORK) LOCK FLUSH 100 UNIT/ML IV SOLN
INTRAVENOUS | Status: AC
  Filled 2013-03-27: qty 5

## 2013-03-27 MED ORDER — ACETAMINOPHEN 325 MG PO TABS
650.0000 mg | ORAL_TABLET | Freq: Once | ORAL | Status: AC
  Administered 2013-03-27: 650 mg via ORAL

## 2013-03-27 MED ORDER — ACETAMINOPHEN 325 MG PO TABS
ORAL_TABLET | ORAL | Status: AC
  Filled 2013-03-27: qty 2

## 2013-03-27 MED ORDER — SODIUM CHLORIDE 0.9 % IV SOLN
Freq: Once | INTRAVENOUS | Status: AC
  Administered 2013-03-27: 8 mg via INTRAVENOUS
  Filled 2013-03-27: qty 4

## 2013-03-27 NOTE — Progress Notes (Signed)
Tolerated chemo well.  Home using cane accompanied by spouse.

## 2013-03-28 ENCOUNTER — Encounter (HOSPITAL_BASED_OUTPATIENT_CLINIC_OR_DEPARTMENT_OTHER): Payer: Medicare Other

## 2013-03-28 VITALS — BP 153/50 | HR 58 | Temp 97.7°F | Resp 20

## 2013-03-28 DIAGNOSIS — Z5111 Encounter for antineoplastic chemotherapy: Secondary | ICD-10-CM

## 2013-03-28 DIAGNOSIS — C8290 Follicular lymphoma, unspecified, unspecified site: Secondary | ICD-10-CM

## 2013-03-28 DIAGNOSIS — C8293 Follicular lymphoma, unspecified, intra-abdominal lymph nodes: Secondary | ICD-10-CM

## 2013-03-28 MED ORDER — HEPARIN SOD (PORK) LOCK FLUSH 100 UNIT/ML IV SOLN
INTRAVENOUS | Status: AC
  Filled 2013-03-28: qty 5

## 2013-03-28 MED ORDER — SODIUM CHLORIDE 0.9 % IV SOLN
Freq: Once | INTRAVENOUS | Status: AC
  Administered 2013-03-28: 10:00:00 via INTRAVENOUS

## 2013-03-28 MED ORDER — SODIUM CHLORIDE 0.9 % IV SOLN
Freq: Once | INTRAVENOUS | Status: AC
  Administered 2013-03-28: 8 mg via INTRAVENOUS
  Filled 2013-03-28: qty 4

## 2013-03-28 MED ORDER — SODIUM CHLORIDE 0.9 % IV SOLN
90.0000 mg/m2 | Freq: Once | INTRAVENOUS | Status: AC
  Administered 2013-03-28: 200 mg via INTRAVENOUS
  Filled 2013-03-28: qty 40

## 2013-03-28 MED ORDER — SODIUM CHLORIDE 0.9 % IJ SOLN
10.0000 mL | INTRAMUSCULAR | Status: DC | PRN
  Administered 2013-03-28: 10 mL
  Filled 2013-03-28: qty 10

## 2013-03-28 MED ORDER — HEPARIN SOD (PORK) LOCK FLUSH 100 UNIT/ML IV SOLN
500.0000 [IU] | Freq: Once | INTRAVENOUS | Status: AC | PRN
  Administered 2013-03-28: 500 [IU]
  Filled 2013-03-28: qty 5

## 2013-03-29 ENCOUNTER — Encounter (HOSPITAL_BASED_OUTPATIENT_CLINIC_OR_DEPARTMENT_OTHER): Payer: Medicare Other

## 2013-03-29 VITALS — BP 124/45 | HR 79

## 2013-03-29 DIAGNOSIS — C8293 Follicular lymphoma, unspecified, intra-abdominal lymph nodes: Secondary | ICD-10-CM

## 2013-03-29 DIAGNOSIS — C8290 Follicular lymphoma, unspecified, unspecified site: Secondary | ICD-10-CM

## 2013-03-29 MED ORDER — PEGFILGRASTIM INJECTION 6 MG/0.6ML
SUBCUTANEOUS | Status: AC
  Filled 2013-03-29: qty 0.6

## 2013-03-29 MED ORDER — PEGFILGRASTIM INJECTION 6 MG/0.6ML
6.0000 mg | Freq: Once | SUBCUTANEOUS | Status: AC
  Administered 2013-03-29: 6 mg via SUBCUTANEOUS

## 2013-03-29 NOTE — Progress Notes (Signed)
VSS.  Neulasta 6 mg given IM Z-track subcutaneously to lower abd tissue. Tolerated well.

## 2013-04-24 ENCOUNTER — Encounter (HOSPITAL_COMMUNITY): Payer: Self-pay

## 2013-04-24 ENCOUNTER — Encounter (HOSPITAL_BASED_OUTPATIENT_CLINIC_OR_DEPARTMENT_OTHER): Payer: Medicare Other

## 2013-04-24 ENCOUNTER — Encounter (HOSPITAL_COMMUNITY): Payer: Medicare Other | Attending: Oncology

## 2013-04-24 VITALS — BP 163/58 | HR 97 | Temp 97.7°F | Resp 18 | Wt 217.6 lb

## 2013-04-24 DIAGNOSIS — C8290 Follicular lymphoma, unspecified, unspecified site: Secondary | ICD-10-CM

## 2013-04-24 DIAGNOSIS — C8299 Follicular lymphoma, unspecified, extranodal and solid organ sites: Secondary | ICD-10-CM

## 2013-04-24 DIAGNOSIS — N390 Urinary tract infection, site not specified: Secondary | ICD-10-CM | POA: Insufficient documentation

## 2013-04-24 DIAGNOSIS — N1330 Unspecified hydronephrosis: Secondary | ICD-10-CM

## 2013-04-24 DIAGNOSIS — D6481 Anemia due to antineoplastic chemotherapy: Secondary | ICD-10-CM

## 2013-04-24 DIAGNOSIS — Z5111 Encounter for antineoplastic chemotherapy: Secondary | ICD-10-CM

## 2013-04-24 DIAGNOSIS — Z5112 Encounter for antineoplastic immunotherapy: Secondary | ICD-10-CM

## 2013-04-24 DIAGNOSIS — R32 Unspecified urinary incontinence: Secondary | ICD-10-CM

## 2013-04-24 LAB — URINALYSIS, ROUTINE W REFLEX MICROSCOPIC
Glucose, UA: >1000 mg/dL — AB
Specific Gravity, Urine: 1.015 (ref 1.005–1.030)
pH: 6.5 (ref 5.0–8.0)

## 2013-04-24 LAB — URINE MICROSCOPIC-ADD ON

## 2013-04-24 LAB — CBC WITH DIFFERENTIAL/PLATELET
Basophils Relative: 0 % (ref 0–1)
Eosinophils Absolute: 0.1 10*3/uL (ref 0.0–0.7)
HCT: 28.2 % — ABNORMAL LOW (ref 36.0–46.0)
Hemoglobin: 9.4 g/dL — ABNORMAL LOW (ref 12.0–15.0)
Lymphs Abs: 1.2 10*3/uL (ref 0.7–4.0)
MCH: 34.1 pg — ABNORMAL HIGH (ref 26.0–34.0)
MCHC: 33.3 g/dL (ref 30.0–36.0)
Monocytes Absolute: 0.9 10*3/uL (ref 0.1–1.0)
Monocytes Relative: 19 % — ABNORMAL HIGH (ref 3–12)
Neutrophils Relative %: 52 % (ref 43–77)
RBC: 2.76 MIL/uL — ABNORMAL LOW (ref 3.87–5.11)

## 2013-04-24 LAB — COMPREHENSIVE METABOLIC PANEL
ALT: 10 U/L (ref 0–35)
AST: 19 U/L (ref 0–37)
Albumin: 3.1 g/dL — ABNORMAL LOW (ref 3.5–5.2)
CO2: 26 mEq/L (ref 19–32)
Calcium: 9.2 mg/dL (ref 8.4–10.5)
GFR calc Af Amer: 20 mL/min — ABNORMAL LOW (ref >90)
GFR calc non Af Amer: 17 mL/min — ABNORMAL LOW (ref >90)
Glucose, Bld: 176 mg/dL — ABNORMAL HIGH (ref 70–99)
Sodium: 137 mEq/L (ref 135–145)
Total Bilirubin: 0.2 mg/dL — ABNORMAL LOW (ref 0.3–1.2)
Total Protein: 6.3 g/dL (ref 6.0–8.3)

## 2013-04-24 MED ORDER — SODIUM CHLORIDE 0.9 % IV SOLN
Freq: Once | INTRAVENOUS | Status: AC
  Administered 2013-04-24: 8 mg via INTRAVENOUS
  Filled 2013-04-24: qty 4

## 2013-04-24 MED ORDER — ACETAMINOPHEN 325 MG PO TABS: 650.0000 mg | ORAL_TABLET | Freq: Once | ORAL | Status: DC

## 2013-04-24 MED ORDER — CIPROFLOXACIN HCL 250 MG PO TABS: 250.0000 mg | ORAL_TABLET | Freq: Two times a day (BID) | ORAL | Status: DC

## 2013-04-24 MED ORDER — SODIUM CHLORIDE 0.9 % IJ SOLN
10.0000 mL | INTRAMUSCULAR | Status: DC | PRN
  Administered 2013-04-24: 10 mL

## 2013-04-24 MED ORDER — SODIUM CHLORIDE 0.9 % IV SOLN
375.0000 mg/m2 | Freq: Once | INTRAVENOUS | Status: AC
  Administered 2013-04-24: 800 mg via INTRAVENOUS
  Filled 2013-04-24: qty 50

## 2013-04-24 MED ORDER — SODIUM CHLORIDE 0.9 % IV SOLN
Freq: Once | INTRAVENOUS | Status: AC
  Administered 2013-04-24: 11:00:00 via INTRAVENOUS

## 2013-04-24 MED ORDER — PHENAZOPYRIDINE HCL 100 MG PO TABS
200.0000 mg | ORAL_TABLET | Freq: Once | ORAL | Status: AC
  Administered 2013-04-24: 200 mg via ORAL
  Filled 2013-04-24: qty 1

## 2013-04-24 MED ORDER — DIPHENHYDRAMINE HCL 25 MG PO CAPS: 50.0000 mg | ORAL_CAPSULE | Freq: Once | ORAL | Status: DC

## 2013-04-24 MED ORDER — HEPARIN SOD (PORK) LOCK FLUSH 100 UNIT/ML IV SOLN
500.0000 [IU] | Freq: Once | INTRAVENOUS | Status: AC | PRN
  Administered 2013-04-24: 500 [IU]

## 2013-04-24 MED ORDER — HEPARIN SOD (PORK) LOCK FLUSH 100 UNIT/ML IV SOLN
INTRAVENOUS | Status: AC
  Filled 2013-04-24: qty 5

## 2013-04-24 MED ORDER — PHENAZOPYRIDINE HCL 200 MG PO TABS: 200.0000 mg | ORAL_TABLET | Freq: Three times a day (TID) | ORAL | Status: DC | PRN

## 2013-04-24 MED ORDER — SODIUM CHLORIDE 0.9 % IV SOLN
90.0000 mg/m2 | Freq: Once | INTRAVENOUS | Status: AC
  Administered 2013-04-24: 200 mg via INTRAVENOUS
  Filled 2013-04-24: qty 40

## 2013-04-24 NOTE — Patient Instructions (Signed)
.  [facility] Discharge Instructions  RECOMMENDATIONS MADE BY THE CONSULTANT AND ANY TEST RESULTS WILL BE SENT TO YOUR REFERRING PHYSICIAN.  EXAM FINDINGS BY THE PHYSICIAN TODAY AND SIGNS OR SYMPTOMS TO REPORT TO CLINIC OR PRIMARY PHYSICIAN: Exam and findings as discussed by Dr. Zigmund Daniel. The material you passed in your stool is mucous MEDICATIONS PRESCRIBED:  Pyridium today while you are here and we will call you in a prescription for at home, this should help with your urinary burning and frequency. cipro has also been called in which is an antibiotic INSTRUCTIONS/FOLLOW-UP: CT scans the end of October 3 weeks to return Thank you for choosing [name] to provide your oncology and hematology care.  To afford each patient quality time with our providers, please arrive at least 15 minutes before your scheduled appointment time.  With your help, our goal is to use those 15 minutes to complete the necessary work-up to ensure our physicians have the information they need to help with your evaluation and healthcare recommendations.    Effective January 1st, 2014, we ask that you re-schedule your appointment with our physicians should you arrive 10 or more minutes late for your appointment.  We strive to give you quality time with our providers, and arriving late affects you and other patients whose appointments are after yours.    Again, thank you for choosing [facility].  Our hope is that these requests will decrease the amount of time that you wait before being seen by our physicians.       _____________________________________________________________  Should you have questions after your visit to [facility], please contact our office at [phone] between the hours of 8:30 a.m. and 5:00 p.m.  Voicemails left after 4:30 p.m. will not be returned until the following business day.  For prescription refill requests, have your pharmacy  contact our office with your prescription refill request.

## 2013-04-24 NOTE — Progress Notes (Signed)
1100 Pt reports she tylenol and benadryl at home prior to appointment today.

## 2013-04-24 NOTE — Progress Notes (Signed)
[facility] OFFICE PROGRESS NOTE  [name] Hector, MD [address] [place] [address]  DIAGNOSIS: Follicular lymphoma  Hydronephrosis of right kidney  Antineoplastic chemotherapy induced anemia(285.3)  Chief Complaint  Patient presents with  . Chemotherapy    cycle#6 follicular lymphoma    CURRENT THERAPY: Bendamustine/Rituxan cycle #6 today  INTERVAL HISTORY: [name] [name] 72 y.o. female returns for followup and administration of cycle #6 of bendamustine/Rituxan for follicular lymphoma.  Right ureteral stent was re\re placed on 03/23/2013 and the patient has experienced pain since then. She has experienced dysuria  with frequency but no hematuria. Incontinence has been a problem for years. She also had 2 days of diarrhea that were watery without hematochezia area she had been constipated prior to that. She denies any external irritation in the perineum. She also denies any fever, night sweats, nausea, vomiting, cough, wheezing, PND, orthopnea, palpitations, headache, or seizures.  MEDICAL HISTORY: Past Medical History  Diagnosis Date  . Hypertension   . Diabetes mellitus, type 2   . Hyperlipidemia   . Iron deficiency anemia 11/21/2012    At time of presentation.  Feraheme 1020 mg on 11/21/12.  . Follicular lymphoma 11/20/2012    Right ureteral obstruction by lymphadenopathy-> right hydronephrosis    INTERIM HISTORY: has Follicular lymphoma; Iron deficiency anemia; Chronic kidney disease (CKD), stage IV (severe); Diabetes mellitus type 2 in obese; Hypertension; UTI (urinary tract infection); Leukocytosis, unspecified; and Hypoglycemia on her problem list.   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 Diagnosis was made on 10/11/2012.  She began chemotherapy on 11/28/2012 and as at 04/24/2013 patient has successfully completed 5 cycles of treatment with  Bendamustine / Rituximab  ALLERGIES:  has No Known Allergies.  MEDICATIONS: has a current medication list which includes the following prescription(s): acetaminophen, acyclovir, allopurinol, atorvastatin, dexamethasone, insulin detemir, lidocaine-prilocaine, multivitamin with minerals, ondansetron, pegfilgrastim, phenazopyridine, PRESCRIPTION MEDICATION, and sulfamethoxazole-trimethoprim, and the following Facility-Administered Medications: acetaminophen, bendamustine (TREANDA) 200 mg in sodium chloride 0.9 % 500 mL chemo infusion, diphenhydramine, heparin lock flush, ondansetron (ZOFRAN) 8 mg, dexamethasone (DECADRON) 10 mg in sodium chloride 0.9 % 50 mL IVPB, riTUXimab (RITUXAN) 800 mg in sodium chloride 0.9 % 250 mL chemo infusion, and sodium chloride.  SURGICAL HISTORY:  Past Surgical History  Procedure Laterality Date  . Cataract extraction    . Cystoscopy w/ ureteral stent placement Right 11/03/2012    Procedure: CYSTOSCOPY WITH RETROGRADE PYELOGRAM/URETERAL STENT PLACEMENT;  Surgeon: Anner Crete, MD;  Location: [facility];  Service: Urology;  Laterality: Right;  . Portacath placement Left 11/03/2012    Procedure: INSERTION PORT-A-CATH;  Surgeon: Marlane Hatcher, MD;  Location: [facility];  Service: General;  Laterality: Left;  Insertion Port-A-Cath Left Subclavian  . Appendectomy    . Cystoscopy w/ ureteral stent placement Right 03/23/2013    Procedure: CYSTOSCOPY WITH STENT REPLACEMENT;  Surgeon: Anner Crete, MD;  Location: [facility];  Service: Urology;  Laterality: Right;    FAMILY HISTORY: family history includes Cancer in her brother and father.  SOCIAL HISTORY:  reports that she has never smoked. She has never used smokeless tobacco. She reports that she does not drink alcohol or use illicit drugs.  REVIEW OF SYSTEMS:  Other than that discussed above is noncontributory.  PHYSICAL EXAMINATION: ECOG PERFORMANCE STATUS: 1 - Symptomatic but completely ambulatory  There were no vitals taken  for this visit.  GENERAL:alert, no distress and comfortable SKIN: skin color, texture, turgor  are normal, no rashes or significant lesions EYES: PERLA; Conjunctiva are pink and non-injected, sclera clear OROPHARYNX:no exudate, no erythema on lips, buccal mucosa, or tongue. NECK: supple, thyroid normal size, non-tender, without nodularity. No masses CHEST: Normal AP diameter with no breast masses. Light port in place. LYMPH:  no palpable lymphadenopathy in the cervical, axillary or inguinal LUNGS: clear to auscultation and percussion with normal breathing effort HEART: regular rate & rhythm and no murmurs and no lower extremity edema ABDOMEN:abdomen soft, non-tender and normal bowel sounds MUSCULOSKELETAL:no cyanosis of digits and no clubbing. Range of motion normal.  NEURO: alert & oriented x 3 with fluent speech, no focal motor/sensory deficits PERINEUM: No evidence of irritation, erythema, purulence, or bleeding.   LABORATORY DATA: Infusion on 04/24/2013  Component Date Value Range Status  . WBC 04/24/2013 4.6  4.0 - 10.5 K/uL Final  . RBC 04/24/2013 2.76* 3.87 - 5.11 MIL/uL Final  . Hemoglobin 04/24/2013 9.4* 12.0 - 15.0 g/dL Final  . HCT 78/29/5621 28.2* 36.0 - 46.0 % Final  . MCV 04/24/2013 102.2* 78.0 - 100.0 fL Final  . MCH 04/24/2013 34.1* 26.0 - 34.0 pg Final  . MCHC 04/24/2013 33.3  30.0 - 36.0 g/dL Final  . RDW 30/86/5784 15.8* 11.5 - 15.5 % Final  . Platelets 04/24/2013 172  150 - 400 K/uL Final  . Neutrophils Relative % 04/24/2013 52  43 - 77 % Final  . Neutro Abs 04/24/2013 2.4  1.7 - 7.7 K/uL Final  . Lymphocytes Relative 04/24/2013 27  12 - 46 % Final  . Lymphs Abs 04/24/2013 1.2  0.7 - 4.0 K/uL Final  . Monocytes Relative 04/24/2013 19* 3 - 12 % Final  . Monocytes Absolute 04/24/2013 0.9  0.1 - 1.0 K/uL Final  . Eosinophils Relative 04/24/2013 2  0 - 5 % Final  . Eosinophils Absolute 04/24/2013 0.1  0.0 - 0.7 K/uL Final  . Basophils Relative 04/24/2013 0  0 - 1 %  Final  . Basophils Absolute 04/24/2013 0.0  0.0 - 0.1 K/uL Final  . Sodium 04/24/2013 137  135 - 145 mEq/L Final  . Potassium 04/24/2013 3.9  3.5 - 5.1 mEq/L Final  . Chloride 04/24/2013 99  96 - 112 mEq/L Final  . CO2 04/24/2013 26  19 - 32 mEq/L Final  . Glucose, Bld 04/24/2013 176* 70 - 99 mg/dL Final  . BUN 69/62/9528 23  6 - 23 mg/dL Final  . Creatinine, Ser 04/24/2013 2.65* 0.50 - 1.10 mg/dL Final  . Calcium 41/32/4401 9.2  8.4 - 10.5 mg/dL Final  . Total Protein 04/24/2013 6.3  6.0 - 8.3 g/dL Final  . Albumin 02/72/5366 3.1* 3.5 - 5.2 g/dL Final  . AST 44/09/4740 19  0 - 37 U/L Final  . ALT 04/24/2013 10  0 - 35 U/L Final  . Alkaline Phosphatase 04/24/2013 100  39 - 117 U/L Final  . Total Bilirubin 04/24/2013 0.2* 0.3 - 1.2 mg/dL Final  . GFR calc non Af Amer 04/24/2013 17* >90 mL/min Final  . GFR calc Af Amer 04/24/2013 20* >90 mL/min Final   Comment: (NOTE)                          The eGFR has been calculated using the CKD EPI equation.                          This calculation has not been validated in all  clinical situations.                          eGFR's persistently <90 mL/min signify possible Chronic Kidney                          Disease.  Marland Kitchen LDH 04/24/2013 285* 94 - 250 U/L Final  . Sed Rate 04/24/2013 90* 0 - 22 mm/hr Final  Infusion on 03/27/2013  Component Date Value Range Status  . WBC 03/27/2013 3.3* 4.0 - 10.5 K/uL Final  . RBC 03/27/2013 2.78* 3.87 - 5.11 MIL/uL Final  . Hemoglobin 03/27/2013 9.1* 12.0 - 15.0 g/dL Final  . HCT 78/29/5621 28.1* 36.0 - 46.0 % Final  . MCV 03/27/2013 101.1* 78.0 - 100.0 fL Final  . MCH 03/27/2013 32.7  26.0 - 34.0 pg Final  . MCHC 03/27/2013 32.4  30.0 - 36.0 g/dL Final  . RDW 30/86/5784 16.9* 11.5 - 15.5 % Final  . Platelets 03/27/2013 158  150 - 400 K/uL Final  . Neutrophils Relative % 03/27/2013 76  43 - 77 % Final  . Neutro Abs 03/27/2013 2.5  1.7 - 7.7 K/uL Final  . Lymphocytes Relative 03/27/2013 16  12 - 46 % Final    . Lymphs Abs 03/27/2013 0.5* 0.7 - 4.0 K/uL Final  . Monocytes Relative 03/27/2013 4  3 - 12 % Final  . Monocytes Absolute 03/27/2013 0.1  0.1 - 1.0 K/uL Final  . Eosinophils Relative 03/27/2013 4  0 - 5 % Final  . Eosinophils Absolute 03/27/2013 0.1  0.0 - 0.7 K/uL Final  . Basophils Relative 03/27/2013 1  0 - 1 % Final  . Basophils Absolute 03/27/2013 0.0  0.0 - 0.1 K/uL Final  . Sodium 03/27/2013 134* 135 - 145 mEq/L Final  . Potassium 03/27/2013 4.4  3.5 - 5.1 mEq/L Final  . Chloride 03/27/2013 101  96 - 112 mEq/L Final  . CO2 03/27/2013 22  19 - 32 mEq/L Final  . Glucose, Bld 03/27/2013 357* 70 - 99 mg/dL Final  . BUN 69/62/9528 28* 6 - 23 mg/dL Final  . Creatinine, Ser 03/27/2013 2.05* 0.50 - 1.10 mg/dL Final  . Calcium 41/32/4401 9.2  8.4 - 10.5 mg/dL Final  . Total Protein 03/27/2013 6.0  6.0 - 8.3 g/dL Final  . Albumin 02/72/5366 3.1* 3.5 - 5.2 g/dL Final  . AST 44/09/4740 19  0 - 37 U/L Final  . ALT 03/27/2013 13  0 - 35 U/L Final  . Alkaline Phosphatase 03/27/2013 107  39 - 117 U/L Final  . Total Bilirubin 03/27/2013 0.2* 0.3 - 1.2 mg/dL Final  . GFR calc non Af Amer 03/27/2013 23* >90 mL/min Final  . GFR calc Af Amer 03/27/2013 27* >90 mL/min Final   Comment: (NOTE)                          The eGFR has been calculated using the CKD EPI equation.                          This calculation has not been validated in all clinical situations.                          eGFR's persistently <90 mL/min signify possible Chronic Kidney  Disease.  Marland Kitchen LDH 03/27/2013 246  94 - 250 U/L Final  . Sed Rate 03/27/2013 74* 0 - 22 mm/hr Final    PATHOLOGY:  Urinalysis    Component Value Date/Time   COLORURINE YELLOW 02/07/2013 1236   APPEARANCEUR CLOUDY* 02/07/2013 1236   LABSPEC 1.015 02/07/2013 1236   PHURINE 6.5 02/07/2013 1236   GLUCOSEU NEGATIVE 02/07/2013 1236   HGBUR SMALL* 02/07/2013 1236   BILIRUBINUR NEGATIVE 02/07/2013 1236   KETONESUR NEGATIVE 02/07/2013  1236   PROTEINUR 100* 02/07/2013 1236   UROBILINOGEN 0.2 02/07/2013 1236   NITRITE NEGATIVE 02/07/2013 1236   LEUKOCYTESUR MODERATE* 02/07/2013 1236    RADIOGRAPHIC STUDIES:     Show images for CT Abdomen Pelvis Wo Contrast         Study Result    *RADIOLOGY REPORT*  Clinical Data: Lymphoma, right flank pain, nausea, weakness,  history of right ureteral stent placement  CT ABDOMEN AND PELVIS WITHOUT CONTRAST  Technique: Multidetector CT imaging of the abdomen and pelvis was  performed following the standard protocol without intravenous  contrast.  Comparison: 09/22/2012, 11/07/2012  Findings: Minor basilar atelectasis versus scarring. No lower lobe  pneumonia, collapse or consolidation. No pericardial or pleural  effusion. No significant hiatal hernia.  Abdomen: Small dependent layering gallstones noted. Gallbladder  is otherwise collapsed. No biliary dilatation or obstruction.  Liver, biliary system, pancreas, spleen, and adrenal glands are  within normal limits for noncontrast study. Central mesenteric fat  stranding noted about the mesenteric artery and vein compatible  with chronic panniculitis.  Right kidney demonstrates mild chronic hydronephrosis and  hydroureter. Right ureteral stent extends from the right UPJ into  the bladder distally. Left kidney and ureter demonstrate no acute  obstruction or urinary tract calculus.  Negative for bowel obstruction, dilatation, ileus, or free air.  No abdominal free fluid, fluid collection, hemorrhage, or abscess.  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 Previously this measured 10.7 x 6.5 cm.  No pelvic free fluid, fluid collection, hemorrhage, new adenopathy,  abscess, inguinal abnormality, or hernia. Uterus and adnexa normal  in size for age. Minor diverticulosis of the sigmoid. No acute  distal bowel  process.  Bones appear osteopenic. Degenerative changes of the lumbar spine  and SI joints.  IMPRESSION:  Interval decrease in size of the paraspinous/presacral nodal mass  encasing the IVC and aortic bifurcations.  Stable chronic mild hydronephrosis and hydroureter on the right  side despite an indwelling right ureteral stent.  No acute obstructing urinary tract calculus demonstrated.  Cholelithiasis  Chronic central mesenteric panniculitis  Sigmoid diverticulosis  Original Report Authenticated By: Judie Petit. Shick     ASSESSMENT: 1. Follicular lymphoma, for cycle 6 of BR today. #2. Status post right ureteral stent replacement, possible infection.  #3. Incontinence of urine.   PLAN: #1. Cycle #6 of BR to be given today. #2. Urinalysis and urine culture. #3. Pyridium 200 mg here and then 3 times a day at home. #4. Cipro 250 mg twice a day for 5 days. #5. Return tomorrow for additional bendamustine and Neulasta subcutaneously in 2 days. #6. CT of the chest abdomen and pelvis in 2 weeks with followup appointment in 3 weeks   All questions were answered. The patient knows to call the clinic with any problems, questions or concerns. We can certainly see the patient much sooner if necessary.   I spent 30 minutes counseling  the patient face to face. The total time spent in the appointment was 40 minutes.    Maurilio Lovely, MD 04/24/2013 11:16 AM

## 2013-04-25 ENCOUNTER — Inpatient Hospital Stay (HOSPITAL_COMMUNITY): Payer: Medicare Other

## 2013-04-25 ENCOUNTER — Encounter (HOSPITAL_BASED_OUTPATIENT_CLINIC_OR_DEPARTMENT_OTHER): Payer: Medicare Other

## 2013-04-25 VITALS — BP 157/58 | HR 83 | Temp 97.7°F | Resp 20

## 2013-04-25 DIAGNOSIS — C8299 Follicular lymphoma, unspecified, extranodal and solid organ sites: Secondary | ICD-10-CM

## 2013-04-25 DIAGNOSIS — C8290 Follicular lymphoma, unspecified, unspecified site: Secondary | ICD-10-CM

## 2013-04-25 DIAGNOSIS — Z5111 Encounter for antineoplastic chemotherapy: Secondary | ICD-10-CM

## 2013-04-25 MED ORDER — SODIUM CHLORIDE 0.9 % IV SOLN
Freq: Once | INTRAVENOUS | Status: AC
  Administered 2013-04-25: 8 mg via INTRAVENOUS
  Filled 2013-04-25: qty 4

## 2013-04-25 MED ORDER — SODIUM CHLORIDE 0.9 % IV SOLN
90.0000 mg/m2 | Freq: Once | INTRAVENOUS | Status: AC
  Administered 2013-04-25: 200 mg via INTRAVENOUS
  Filled 2013-04-25: qty 40

## 2013-04-25 MED ORDER — SODIUM CHLORIDE 0.9 % IJ SOLN
10.0000 mL | INTRAMUSCULAR | Status: DC | PRN
  Administered 2013-04-25: 10 mL

## 2013-04-25 MED ORDER — HEPARIN SOD (PORK) LOCK FLUSH 100 UNIT/ML IV SOLN
500.0000 [IU] | Freq: Once | INTRAVENOUS | Status: AC | PRN
  Administered 2013-04-25: 500 [IU]

## 2013-04-25 MED ORDER — SODIUM CHLORIDE 0.9 % IV SOLN
Freq: Once | INTRAVENOUS | Status: AC
  Administered 2013-04-25: 10:00:00 via INTRAVENOUS

## 2013-04-25 MED ORDER — HEPARIN SOD (PORK) LOCK FLUSH 100 UNIT/ML IV SOLN
INTRAVENOUS | Status: AC
  Filled 2013-04-25: qty 5

## 2013-04-26 ENCOUNTER — Encounter (HOSPITAL_BASED_OUTPATIENT_CLINIC_OR_DEPARTMENT_OTHER): Payer: Medicare Other

## 2013-04-26 ENCOUNTER — Ambulatory Visit (HOSPITAL_COMMUNITY): Payer: Medicare Other | Admitting: Oncology

## 2013-04-26 VITALS — BP 154/70 | HR 76 | Temp 97.8°F | Resp 18

## 2013-04-26 DIAGNOSIS — C8299 Follicular lymphoma, unspecified, extranodal and solid organ sites: Secondary | ICD-10-CM

## 2013-04-26 DIAGNOSIS — C8290 Follicular lymphoma, unspecified, unspecified site: Secondary | ICD-10-CM

## 2013-04-26 LAB — URINE CULTURE: Colony Count: NO GROWTH

## 2013-04-26 MED ORDER — PEGFILGRASTIM INJECTION 6 MG/0.6ML
SUBCUTANEOUS | Status: AC
  Filled 2013-04-26: qty 0.6

## 2013-04-26 MED ORDER — PEGFILGRASTIM INJECTION 6 MG/0.6ML
6.0000 mg | Freq: Once | SUBCUTANEOUS | Status: AC
  Administered 2013-04-26: 6 mg via SUBCUTANEOUS

## 2013-04-26 NOTE — Progress Notes (Signed)
[name] [name] presents today for injection per MD orders. Neulasta 6mg  administered SQ in right Abdomen. Administration without incident. No problems after chemo. Patient tolerated well.

## 2013-05-08 ENCOUNTER — Ambulatory Visit (HOSPITAL_COMMUNITY): Admission: RE | Admit: 2013-05-08 | Payer: Medicare Other | Source: Ambulatory Visit

## 2013-05-08 ENCOUNTER — Other Ambulatory Visit (HOSPITAL_COMMUNITY): Payer: Self-pay | Admitting: Hematology and Oncology

## 2013-05-08 ENCOUNTER — Ambulatory Visit (HOSPITAL_COMMUNITY)
Admission: RE | Admit: 2013-05-08 | Discharge: 2013-05-08 | Disposition: A | Discharge status: Home / Self Care | Payer: Medicare Other | Source: Ambulatory Visit | Attending: Hematology and Oncology | Admitting: Hematology and Oncology

## 2013-05-08 DIAGNOSIS — N135 Crossing vessel and stricture of ureter without hydronephrosis: Secondary | ICD-10-CM | POA: Insufficient documentation

## 2013-05-08 DIAGNOSIS — C8290 Follicular lymphoma, unspecified, unspecified site: Secondary | ICD-10-CM

## 2013-05-08 DIAGNOSIS — K8020 Calculus of gallbladder without cholecystitis without obstruction: Secondary | ICD-10-CM | POA: Insufficient documentation

## 2013-05-08 DIAGNOSIS — R1900 Intra-abdominal and pelvic swelling, mass and lump, unspecified site: Secondary | ICD-10-CM | POA: Insufficient documentation

## 2013-05-08 DIAGNOSIS — I129 Hypertensive chronic kidney disease with stage 1 through stage 4 chronic kidney disease, or unspecified chronic kidney disease: Secondary | ICD-10-CM | POA: Insufficient documentation

## 2013-05-08 DIAGNOSIS — I2510 Atherosclerotic heart disease of native coronary artery without angina pectoris: Secondary | ICD-10-CM | POA: Insufficient documentation

## 2013-05-08 DIAGNOSIS — C8589 Other specified types of non-Hodgkin lymphoma, extranodal and solid organ sites: Secondary | ICD-10-CM | POA: Insufficient documentation

## 2013-05-08 DIAGNOSIS — E119 Type 2 diabetes mellitus without complications: Secondary | ICD-10-CM | POA: Insufficient documentation

## 2013-05-15 ENCOUNTER — Encounter (HOSPITAL_COMMUNITY): Payer: Medicare Other | Attending: Oncology

## 2013-05-15 ENCOUNTER — Other Ambulatory Visit (HOSPITAL_COMMUNITY): Payer: Self-pay | Admitting: Hematology and Oncology

## 2013-05-15 ENCOUNTER — Encounter (HOSPITAL_COMMUNITY): Payer: Self-pay

## 2013-05-15 VITALS — BP 159/79 | HR 114 | Temp 97.8°F | Resp 22 | Wt 217.1 lb

## 2013-05-15 DIAGNOSIS — N1330 Unspecified hydronephrosis: Secondary | ICD-10-CM

## 2013-05-15 DIAGNOSIS — C8299 Follicular lymphoma, unspecified, extranodal and solid organ sites: Secondary | ICD-10-CM

## 2013-05-15 DIAGNOSIS — D509 Iron deficiency anemia, unspecified: Secondary | ICD-10-CM

## 2013-05-15 DIAGNOSIS — Z9889 Other specified postprocedural states: Secondary | ICD-10-CM | POA: Insufficient documentation

## 2013-05-15 DIAGNOSIS — C8290 Follicular lymphoma, unspecified, unspecified site: Secondary | ICD-10-CM

## 2013-05-15 MED ORDER — FLUCONAZOLE 200 MG PO TABS: 200.0000 mg | ORAL_TABLET | Freq: Every day | ORAL | Status: DC

## 2013-05-15 NOTE — Patient Instructions (Signed)
[facility] Discharge Instructions  RECOMMENDATIONS MADE BY THE CONSULTANT AND ANY TEST RESULTS WILL BE SENT TO YOUR REFERRING PHYSICIAN.  STOP Dexamethasone. STOP Septra DS. You have thrush in your mouth. A prescription for Diflucan has been sent to your pharmacy. Be sure to finish taking all of this medication. Dr.Formanek will send a note to Dr.Wrenn about your CT scan results. Port flushes are every 6 weeks. Return to clinic in 2 months to see MD.  Thank you for choosing [facility] to provide your oncology and hematology care.  To afford each patient quality time with our providers, please arrive at least 15 minutes before your scheduled appointment time.  With your help, our goal is to use those 15 minutes to complete the necessary work-up to ensure our physicians have the information they need to help with your evaluation and healthcare recommendations.    Effective January 1st, 2014, we ask that you re-schedule your appointment with our physicians should you arrive 10 or more minutes late for your appointment.  We strive to give you quality time with our providers, and arriving late affects you and other patients whose appointments are after yours.    Again, thank you for choosing [facility].  Our hope is that these requests will decrease the amount of time that you wait before being seen by our physicians.       _____________________________________________________________  Should you have questions after your visit to [facility], please contact our office at [phone] between the hours of 8:30 a.m. and 5:00 p.m.  Voicemails left after 4:30 p.m. will not be returned until the following business day.  For prescription refill requests, have your pharmacy contact our office with your prescription refill request.

## 2013-05-15 NOTE — Progress Notes (Signed)
[facility]  OFFICE PROGRESS NOTE  [name], MD [address] [place] [address]  DIAGNOSIS: Follicular lymphoma  Hydronephrosis of right kidney  Iron deficiency anemia, unspecified  Chief Complaint  Patient presents with  . Lymphoma    discuss CT results    CURRENT THERAPY: Just completed 6 cycles of bendamustine plus Rituxan.  INTERVAL HISTORY: [name] [name] 72 y.o. female returns for followup after completion of 6 cycles of chemotherapy with bendamustine and Rituxan for follicular lymphoma with right hydronephrosis, status post stenting.  She still experiences urinary hesitancy and has orange-colored urine from Pyridium. Appetite has been good with no nausea or vomiting. She denies a diarrhea, constipation, but has had a sore mouth with difficulty in swallowing. She continues on Septra 3 times a week along with dexamethasone. She denies any lower extremity swelling or redness, chest pain, PND, orthopnea, palpitations, skin rash, joint pain, headache, or seizures.  MEDICAL HISTORY: Past Medical History  Diagnosis Date  . Hypertension   . Diabetes mellitus, type 2   . Hyperlipidemia   . Iron deficiency anemia 11/21/2012    At time of presentation.  Feraheme 1020 mg on 11/21/12.  . Follicular lymphoma 11/20/2012    Right ureteral obstruction by lymphadenopathy-> right hydronephrosis    INTERIM HISTORY: has Follicular lymphoma; Iron deficiency anemia; Chronic kidney disease (CKD), stage IV (severe); Diabetes mellitus type 2 in obese; Hypertension; UTI (urinary tract infection); Leukocytosis, unspecified; and Hypoglycemia on her problem list.   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 Diagnosis was made on 10/11/2012. She began chemotherapy on 11/28/2012 and as at 04/24/2013 patient has successfully completed 6 cycles of  treatment with Bendamustine / Rituximab   ALLERGIES:  has No Known Allergies.  MEDICATIONS: has a current medication list which includes the following prescription(s): acetaminophen, acyclovir, atorvastatin, febuxostat, furosemide, insulin detemir, lidocaine-prilocaine, multivitamin with minerals, phenazopyridine, allopurinol, dexamethasone, ondansetron, pegfilgrastim, PRESCRIPTION MEDICATION, and sulfamethoxazole-trimethoprim.  SURGICAL HISTORY:  Past Surgical History  Procedure Laterality Date  . Cataract extraction    . Cystoscopy w/ ureteral stent placement Right 11/03/2012    Procedure: CYSTOSCOPY WITH RETROGRADE PYELOGRAM/URETERAL STENT PLACEMENT;  Surgeon: Anner Crete, MD;  Location: [facility];  Service: Urology;  Laterality: Right;  . Portacath placement Left 11/03/2012    Procedure: INSERTION PORT-A-CATH;  Surgeon: Marlane Hatcher, MD;  Location: [facility];  Service: General;  Laterality: Left;  Insertion Port-A-Cath Left Subclavian  . Appendectomy    . Cystoscopy w/ ureteral stent placement Right 03/23/2013    Procedure: CYSTOSCOPY WITH STENT REPLACEMENT;  Surgeon: Anner Crete, MD;  Location: [facility];  Service: Urology;  Laterality: Right;    FAMILY HISTORY: family history includes Cancer in her brother and father.  SOCIAL HISTORY:  reports that she has never smoked. She has never used smokeless tobacco. She reports that she does not drink alcohol or use illicit drugs.  REVIEW OF SYSTEMS:  Other than that discussed above is noncontributory.  PHYSICAL EXAMINATION: ECOG PERFORMANCE STATUS: 1 - Symptomatic but completely ambulatory  Blood pressure 159/79, pulse 114, temperature 97.8 F (36.6 C), temperature source Oral, resp. rate 22, weight 217 lb 1.6 oz (98.476 kg).  GENERAL:alert, no distress and comfortable SKIN: skin color, texture, turgor are normal, no rashes or significant lesions EYES: PERLA; Conjunctiva are pink and non-injected, sclera clear OROPHARYNX:no exudate, no  erythema on lips, buccal mucosa, or tongue. NECK: supple,  thyroid normal size, non-tender, without nodularity. No masses CHEST: LifePort in place with no breast masses. LYMPH:  no palpable lymphadenopathy in the cervical, axillary or inguinal LUNGS: clear to auscultation and percussion with normal breathing effort HEART: regular rate & rhythm and no murmurs. ABDOMEN:abdomen soft, non-tender and normal bowel sounds MUSCULOSKELETAL:no cyanosis of digits and no clubbing. Range of motion normal. No CVA tenderness. NEURO: alert & oriented x 3 with fluent speech, no focal motor/sensory deficits.   LABORATORY DATA: Infusion on 04/24/2013  Component Date Value Range Status  . WBC 04/24/2013 4.6  4.0 - 10.5 K/uL Final  . RBC 04/24/2013 2.76* 3.87 - 5.11 MIL/uL Final  . Hemoglobin 04/24/2013 9.4* 12.0 - 15.0 g/dL Final  . HCT 16/04/9603 28.2* 36.0 - 46.0 % Final  . MCV 04/24/2013 102.2* 78.0 - 100.0 fL Final  . MCH 04/24/2013 34.1* 26.0 - 34.0 pg Final  . MCHC 04/24/2013 33.3  30.0 - 36.0 g/dL Final  . RDW 54/03/8118 15.8* 11.5 - 15.5 % Final  . Platelets 04/24/2013 172  150 - 400 K/uL Final  . Neutrophils Relative % 04/24/2013 52  43 - 77 % Final  . Neutro Abs 04/24/2013 2.4  1.7 - 7.7 K/uL Final  . Lymphocytes Relative 04/24/2013 27  12 - 46 % Final  . Lymphs Abs 04/24/2013 1.2  0.7 - 4.0 K/uL Final  . Monocytes Relative 04/24/2013 19* 3 - 12 % Final  . Monocytes Absolute 04/24/2013 0.9  0.1 - 1.0 K/uL Final  . Eosinophils Relative 04/24/2013 2  0 - 5 % Final  . Eosinophils Absolute 04/24/2013 0.1  0.0 - 0.7 K/uL Final  . Basophils Relative 04/24/2013 0  0 - 1 % Final  . Basophils Absolute 04/24/2013 0.0  0.0 - 0.1 K/uL Final  . Sodium 04/24/2013 137  135 - 145 mEq/L Final  . Potassium 04/24/2013 3.9  3.5 - 5.1 mEq/L Final  . Chloride 04/24/2013 99  96 - 112 mEq/L Final  . CO2 04/24/2013 26  19 - 32 mEq/L Final  . Glucose, Bld 04/24/2013 176* 70 - 99 mg/dL Final  . BUN 14/78/2956 23  6  - 23 mg/dL Final  . Creatinine, Ser 04/24/2013 2.65* 0.50 - 1.10 mg/dL Final  . Calcium 21/30/8657 9.2  8.4 - 10.5 mg/dL Final  . Total Protein 04/24/2013 6.3  6.0 - 8.3 g/dL Final  . Albumin 84/69/6295 3.1* 3.5 - 5.2 g/dL Final  . AST 28/41/3244 19  0 - 37 U/L Final  . ALT 04/24/2013 10  0 - 35 U/L Final  . Alkaline Phosphatase 04/24/2013 100  39 - 117 U/L Final  . Total Bilirubin 04/24/2013 0.2* 0.3 - 1.2 mg/dL Final  . GFR calc non Af Amer 04/24/2013 17* >90 mL/min Final  . GFR calc Af Amer 04/24/2013 20* >90 mL/min Final   Comment: (NOTE)                          The eGFR has been calculated using the CKD EPI equation.                          This calculation has not been validated in all clinical situations.                          eGFR's persistently <90 mL/min signify possible Chronic Kidney  Disease.  Marland Kitchen LDH 04/24/2013 285* 94 - 250 U/L Final  . Sed Rate 04/24/2013 90* 0 - 22 mm/hr Final  . Color, Urine 04/24/2013 YELLOW  YELLOW Final  . APPearance 04/24/2013 HAZY* CLEAR Final  . Specific Gravity, Urine 04/24/2013 1.015  1.005 - 1.030 Final  . pH 04/24/2013 6.5  5.0 - 8.0 Final  . Glucose, UA 04/24/2013 >1000* NEGATIVE mg/dL Final  . Hgb urine dipstick 04/24/2013 LARGE* NEGATIVE Final  . Bilirubin Urine 04/24/2013 NEGATIVE  NEGATIVE Final  . Ketones, ur 04/24/2013 NEGATIVE  NEGATIVE mg/dL Final  . Protein, ur 16/04/9603 TRACE* NEGATIVE mg/dL Final  . Urobilinogen, UA 04/24/2013 0.2  0.0 - 1.0 mg/dL Final  . Nitrite 54/03/8118 NEGATIVE  NEGATIVE Final  . Leukocytes, UA 04/24/2013 MODERATE* NEGATIVE Final  . Specimen Description 04/24/2013 URINE, CLEAN CATCH   Final  . Special Requests 04/24/2013 NONE   Final  . Culture  Setup Time 04/24/2013    Final                   Value:04/25/2013 03:16                         Performed at [facility]  . Colony Count 04/24/2013    Final                   Value:NO GROWTH                          Performed at [facility]  . Culture 04/24/2013    Final                   Value:NO GROWTH                         Performed at [facility]  . Report Status 04/24/2013 04/26/2013 FINAL   Final  . Squamous Epithelial / LPF 04/24/2013 RARE  RARE Final  . WBC, UA 04/24/2013 TOO NUMEROUS TO COUNT  <3 WBC/hpf Final  . RBC / HPF 04/24/2013 TOO NUMEROUS TO COUNT  <3 RBC/hpf Final  . Bacteria, UA 04/24/2013 FEW* RARE Final  . Urine-Other 04/24/2013 MANY YEAST   Final    PATHOLOGY: FINAL for [name] [name], [name] [name] ([id number]) Patient Name: [name] [name], [name] [name] Accession #: [id number] DOB: Sep 08, 1940 Age: 79 Gender: F Client Name [facility] Collected Date: 10/11/2012 Received Date: 10/11/2012 Physician: Bjorn Pippin Chart #: MRN # : [id number] Physician cc: D. Oley Balm Race:W Visit #: [id number] REPORT OF SURGICAL PATHOLOGY FINAL DIAGNOSIS Diagnosis Retroperitoneal mass, biopsy - NON-HODGKIN'S B CELL LYMPHOMA. - SEE ONCOLOGY TABLE. Microscopic Comment LYMPHOMA Histologic type: Non-Hodgkin's lymphoma, follicular center cell type. Grade (if applicable): See comment. Flow cytometry: Monoclonal, lambda restricted B cell population expressing pan B cell antigens including CD20 with associated CD10 expression ([id number]). Immunohistochemical stains: CD20, CD79a, BCL-2, BCL-6, CD10, CD3, CD43, and Ki-67 with appropriate controls. Touch preps/imprints: Not performed. Comments: The sections show soft tissue including skeletal muscle infiltrated by a relatively dense lymphoproliferative process characterized by the presence of numerous, ill defined lymphoid follicles displaying abundance/predominance of large transformed cells and large cleaved lymphocytes. The intervening tissue displays abundance of small round to slightly irregular lymphocytes. To further evaluate this process, flow cytometric analysis and immunohistochemical stains were performed. Flow cytometric analysis shows  a monoclonal B cell population expressing pan B cell antigens including CD20 with  associated CD10 expression. Immunohistochemical stains show that there is predominance of B cells particularly in the follicular centers as seen with CD79a and CD20 in association with CD10, BCL-2 and BCL-6 expression. The lymphocytes between the follicles show a mixture of T and B cells. Ki-67 shows increased but variable positivity in follicular centers which focally show more than 75% expression. 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 While it is somewhat difficult to accurately grade follicular lymphoma in small biopsy material, the relative abundance of large lymphoid cells present and the increased Ki-67 positivity within the atypical follicles favor a high grade process. (BNS:gt, 10/16/12) Guerry Bruin MD Pathologist, Electronic Signature (Case signed 10/16/2012) 1 of 2 FINAL for [name] [name], [name] [name] ([id number]) Specimen Gross and Clinical Information Specimen(s) Obtained: Retroperitoneal mass, biopsy Specimen Clinical Information Erie Noe) Gross Received in formalin are five cores of soft white tissue measuring 1.5 to 2.2 cm in length and each less than 0.1 cm in diameter. A portion of the specimen is placed in RPMI for flow cytometry and the remainder is entirely submitted in one cassette. (GRP:kh 10-11-12) Stain(s) used in Diagnosis: The following stain(s) were used in diagnosing the case: CD-10, CD 43, BCl 6 , BCl 2, CD 3, CD 20, KI-67-NO ACIS, CD 79a. The control(s) stained appropriately. Disclaimer Some of these immunohistochemical stains may have been developed and the performance characteristics determined by [facility]. Some may not have been cleared or approved by the U.S. Food and Drug Administration. The FDA has determined that such clearance or approval is not necessary. This test is used for clinical purposes. It should not be  regarded as investigational or for research. This laboratory is certified under the Clinical Laboratory Improvement Amendments of 1988 (CLIA-88) as qualified to perform high complexity clinical laboratory testing. Report signed out from the following location(s) [facility] [address],[place] [address].CLIA:[id number]., [facility] [address], [place], [place] [address].   for [name] [name], [name] [name] ([id number]) Patient Name: [name] [name], [name] [name] Accession #: [id number] DOB: 03-09-1941 Age: 66 Gender: F Client Name [facility] Collected Date: 10/11/2012 Received Date: 10/11/2012 Physician: D. Oley Balm Chart #: MRN # : [id number] Physician cc: Bjorn Pippin Race:W Visit #: [id number] FLOW CYTOMETRY REPORT INTERPRETATION Interpretation Tissue-Flow Cytometry MONOCLONAL B CELL POPULATION IDENTIFIED. Diagnosis Comment: The findings are consistent with Non-Hodgkin's B cell lymphoma. (BNS:gt, 10/16/12) Guerry Bruin MD Pathologist, Electronic Signature (Case signed 10/16/2012) GROSS AND MICROSCOPIC INFORMATION Source Tissue-Flow Cytometry Microscopic Gated population: Flow cytometric immunophenotyping is performed using antibiodies to the antigens listed in the table below. Electronic gates are placed around a cell cluster displaying light scatter properties corresponding to lymphocytes. - Abnormal Cells in gated population: 79 % - Phenotype of Abnormal Cells: HLA-Dr, CD10, CD20, CD19, CD22, CD23, Lambda 1 of 2 FINAL for [name] [name], [name] [name] ([id number]) Specimen Table Lymphoid Associated Myeloid Associated Misc. As CD2 neg CD19 pos CD11c ND CD45 ND CD3 neg CD20 pos CD13 ND HLA-DR pos CD4 neg CD21 neg CD14 ND CD10 pos CD5 neg CD22 pos CD15 ND CD56/16 ND CD7 neg CD23 pos CD33 ND ZAP70 ND CD8 neg CD103 ND MPO ND CD34 ND CD25 ND FMC7 ND CD117 ND CD52 ND sKappa neg CD38 ND sLambda pos cKappa ND cLambda ND Gross Ref.  [id number] All controls stained appropriately. The above tests were developed and their performance characteristics determined by the [facility]. They have not been cleared or approved by the U.S. Food and  Drug Administration." Report signed from the following location(s) [facility] [address], [place], [place] [address]. Urinalysis    Component Value Date/Time   COLORURINE YELLOW 04/24/2013 1600   APPEARANCEUR HAZY* 04/24/2013 1600   LABSPEC 1.015 04/24/2013 1600   PHURINE 6.5 04/24/2013 1600   GLUCOSEU >1000* 04/24/2013 1600   HGBUR LARGE* 04/24/2013 1600   BILIRUBINUR NEGATIVE 04/24/2013 1600   KETONESUR NEGATIVE 04/24/2013 1600   PROTEINUR TRACE* 04/24/2013 1600   UROBILINOGEN 0.2 04/24/2013 1600   NITRITE NEGATIVE 04/24/2013 1600   LEUKOCYTESUR MODERATE* 04/24/2013 1600    RADIOGRAPHIC STUDIES: Ct Abdomen Pelvis Wo Contrast  05/08/2013   CLINICAL DATA:  Restaging lymphoma. History of diabetes, hypertension and chronic renal failure.  EXAM: CT CHEST, ABDOMEN AND PELVIS WITHOUT CONTRAST  TECHNIQUE: Multidetector CT imaging of the chest, abdomen and pelvis was performed following the standard protocol without IV contrast.  COMPARISON:  COMPARISON PET-CT 11/07/2012. Abdominal pelvic CT 02/07/2013.  FINDINGS: CT CHEST FINDINGS  The exophytic lesion projecting posteriorly from the left thyroid lobe is unchanged, measuring 1.5 cm on image 8. There are no enlarged mediastinal, hilar or axillary lymph nodes. Left subclavian Port-A-Cath tip is in the left brachiocephalic vein.  Diffuse coronary artery calcifications are noted. There is mild atherosclerosis of the aorta and great vessels. There is no pleural or pericardial effusion. The lungs are clear. There are no worrisome osseous findings.  CT ABDOMEN AND PELVIS FINDINGS  Calcified gallstones are again noted. There is a possible small stone within the cystic duct. There is no significant distention of the  gallbladder, wall thickening or surrounding inflammatory change. The liver, spleen, pancreas and adrenal glands appear normal.  Both kidneys demonstrate mild cortical thinning. There is a double-J right ureteral stent which appears well positioned. Mild collecting system dilatation on the right is unchanged.  The ill-defined soft tissue mass anterior to the lower lumbar spine and sacrum appears unchanged. This encases the iliac vasculature and is likely responsible for the chronic right ureter obstruction. There is no significant dilatation of the left ureter. There are no new or enlarging pelvic masses or lymph nodes.  The stomach, small bowel and colon demonstrate no significant findings. There is no adnexal mass. A small amount of air is present within the urinary bladder. The bladder is incompletely distended.  Degenerative changes in the spine associated with scoliosis are stable. There are no worrisome osseous findings.  IMPRESSION: CT CHEST IMPRESSION  1. No evidence of thoracic lymphoma. 2. Atherosclerosis with prominent involvement of the coronary arteries.  CT ABDOMEN AND PELVIS IMPRESSION  1. Stable presacral mass with associated chronic right ureteral obstruction. The double-J right ureteral stent is unchanged in position. 2. No evidence progressive abdominal pelvic lymphoma. 3. Cholelithiasis and bilateral renal cortical thinning again noted.   Electronically Signed   By: Roxy Horseman M.D.   On: 05/08/2013 09:46   Ct Chest Wo Contrast  05/08/2013   CLINICAL DATA:  Restaging lymphoma. History of diabetes, hypertension and chronic renal failure.  EXAM: CT CHEST, ABDOMEN AND PELVIS WITHOUT CONTRAST  TECHNIQUE: Multidetector CT imaging of the chest, abdomen and pelvis was performed following the standard protocol without IV contrast.  COMPARISON:  COMPARISON PET-CT 11/07/2012. Abdominal pelvic CT 02/07/2013.  FINDINGS: CT CHEST FINDINGS  The exophytic lesion projecting posteriorly from the left thyroid  lobe is unchanged, measuring 1.5 cm on image 8. There are no enlarged mediastinal, hilar or axillary lymph nodes. Left subclavian Port-A-Cath tip is in the left brachiocephalic vein.  Diffuse coronary artery calcifications are noted. There is mild atherosclerosis of the aorta and great vessels. There is no pleural or pericardial effusion. The lungs are clear. There are no worrisome osseous findings.  CT ABDOMEN AND PELVIS FINDINGS  Calcified gallstones are again noted. There is a possible small stone within the cystic duct. There is no significant distention of the gallbladder, wall thickening or surrounding inflammatory change. The liver, spleen, pancreas and adrenal glands appear normal.  Both kidneys demonstrate mild cortical thinning. There is a double-J right ureteral stent which appears well positioned. Mild collecting system dilatation on the right is unchanged.  The ill-defined soft tissue mass anterior to the lower lumbar spine and sacrum appears unchanged. This encases the iliac vasculature and is likely responsible for the chronic right ureter obstruction. There is no significant dilatation of the left ureter. There are no new or enlarging pelvic masses or lymph nodes.  The stomach, small bowel and colon demonstrate no significant findings. There is no adnexal mass. A small amount of air is present within the urinary bladder. The bladder is incompletely distended.  Degenerative changes in the spine associated with scoliosis are stable. There are no worrisome osseous findings.  IMPRESSION: CT CHEST IMPRESSION  1. No evidence of thoracic lymphoma. 2. Atherosclerosis with prominent involvement of the coronary arteries.  CT ABDOMEN AND PELVIS IMPRESSION  1. Stable presacral mass with associated chronic right ureteral obstruction. The double-J right ureteral stent is unchanged in position. 2. No evidence progressive abdominal pelvic lymphoma. 3. Cholelithiasis and bilateral renal cortical thinning again noted.    Electronically Signed   By: Roxy Horseman M.D.   On: 05/08/2013 09:46    ASSESSMENT:  #1. Follicular lymphoma, status post 6 cycles of bendamustine and Rituxan, no evidence of disease. #2. Persistent right hydronephrosis, status post stenting with continued urinary symptoms. #3. Hypertension, controlled. #4. Diabetes mellitus, type II, non-insulin requiring, controlled. #5. Iron deficiency anemia, status post Feraheme in May of 2014. #6. Urine culture from 04/24/2013 was negative.    PLAN:  #1. Stop Septra and dexamethasone. #2. Diflucan 200 mg daily for 5 days with one refill. #3. Refer back to urology regarding reimplantation of right ureter versus continuing to utilize stent. #4. Followup in 2 months with CBC, chem profile, beta 2 microglobulin, and LDH.   All questions were answered. The patient knows to call the clinic with any problems, questions or concerns. We can certainly see the patient much sooner if necessary.   I spent 25 minutes counseling the patient face to face. The total time spent in the appointment was 30 minutes.    Maurilio Lovely, MD 05/15/2013 11:10 AM

## 2013-05-25 ENCOUNTER — Ambulatory Visit (INDEPENDENT_AMBULATORY_CARE_PROVIDER_SITE_OTHER): Payer: Medicare Other | Admitting: Urology

## 2013-05-25 DIAGNOSIS — N135 Crossing vessel and stricture of ureter without hydronephrosis: Secondary | ICD-10-CM

## 2013-05-25 DIAGNOSIS — R82998 Other abnormal findings in urine: Secondary | ICD-10-CM

## 2013-05-25 DIAGNOSIS — N1330 Unspecified hydronephrosis: Secondary | ICD-10-CM

## 2013-06-01 ENCOUNTER — Ambulatory Visit (INDEPENDENT_AMBULATORY_CARE_PROVIDER_SITE_OTHER): Payer: Medicare Other | Admitting: Urology

## 2013-06-01 ENCOUNTER — Other Ambulatory Visit: Payer: Self-pay | Admitting: Urology

## 2013-06-01 DIAGNOSIS — N135 Crossing vessel and stricture of ureter without hydronephrosis: Secondary | ICD-10-CM

## 2013-06-01 DIAGNOSIS — N1330 Unspecified hydronephrosis: Secondary | ICD-10-CM

## 2013-06-06 ENCOUNTER — Encounter (HOSPITAL_COMMUNITY): Payer: Self-pay

## 2013-06-06 ENCOUNTER — Encounter (HOSPITAL_BASED_OUTPATIENT_CLINIC_OR_DEPARTMENT_OTHER): Payer: Medicare Other

## 2013-06-06 DIAGNOSIS — C8293 Follicular lymphoma, unspecified, intra-abdominal lymph nodes: Secondary | ICD-10-CM

## 2013-06-06 DIAGNOSIS — Z95828 Presence of other vascular implants and grafts: Secondary | ICD-10-CM

## 2013-06-06 DIAGNOSIS — Z452 Encounter for adjustment and management of vascular access device: Secondary | ICD-10-CM

## 2013-06-06 HISTORY — DX: Presence of other vascular implants and grafts: Z95.828

## 2013-06-06 MED ORDER — HEPARIN SOD (PORK) LOCK FLUSH 100 UNIT/ML IV SOLN
INTRAVENOUS | Status: AC
  Filled 2013-06-06: qty 5

## 2013-06-06 MED ORDER — SODIUM CHLORIDE 0.9 % IJ SOLN
10.0000 mL | INTRAMUSCULAR | Status: DC | PRN
  Administered 2013-06-06: 10 mL via INTRAVENOUS

## 2013-06-06 MED ORDER — HEPARIN SOD (PORK) LOCK FLUSH 100 UNIT/ML IV SOLN
500.0000 [IU] | Freq: Once | INTRAVENOUS | Status: AC
  Administered 2013-06-06: 500 [IU] via INTRAVENOUS

## 2013-06-06 NOTE — Progress Notes (Signed)
[name] [name] presented for Portacath access and flush. Proper placement of portacath confirmed by CXR. Portacath located left chest wall accessed with  H 20 needle. Sluggish blood return  Portacath flushed with 20ml NS and 500U/72ml Heparin and needle removed intact. Procedure without incident. Patient tolerated procedure well.

## 2013-06-11 ENCOUNTER — Encounter (HOSPITAL_COMMUNITY): Payer: Self-pay

## 2013-06-11 ENCOUNTER — Encounter (HOSPITAL_COMMUNITY)
Admission: RE | Admit: 2013-06-11 | Discharge: 2013-06-11 | Disposition: A | Discharge status: Home / Self Care | Payer: Medicare Other | Source: Ambulatory Visit | Attending: Urology | Admitting: Urology

## 2013-06-11 DIAGNOSIS — N2889 Other specified disorders of kidney and ureter: Secondary | ICD-10-CM | POA: Insufficient documentation

## 2013-06-11 DIAGNOSIS — N1330 Unspecified hydronephrosis: Secondary | ICD-10-CM | POA: Insufficient documentation

## 2013-06-11 MED ORDER — FUROSEMIDE 10 MG/ML IJ SOLN
INTRAMUSCULAR | Status: AC
  Administered 2013-06-11: 20 mg
  Filled 2013-06-11: qty 2

## 2013-06-11 MED ORDER — TECHNETIUM TC 99M MERTIATIDE
15.0000 | Freq: Once | INTRAVENOUS | Status: AC | PRN
  Administered 2013-06-11: 11 via INTRAVENOUS

## 2013-06-19 ENCOUNTER — Other Ambulatory Visit: Payer: Self-pay | Admitting: Urology

## 2013-06-19 DIAGNOSIS — N1330 Unspecified hydronephrosis: Secondary | ICD-10-CM

## 2013-06-20 ENCOUNTER — Ambulatory Visit (HOSPITAL_COMMUNITY)
Admission: RE | Admit: 2013-06-20 | Discharge: 2013-06-20 | Disposition: A | Discharge status: Home / Self Care | Payer: Medicare Other | Source: Ambulatory Visit | Attending: Urology | Admitting: Urology

## 2013-06-20 DIAGNOSIS — N269 Renal sclerosis, unspecified: Secondary | ICD-10-CM | POA: Insufficient documentation

## 2013-06-20 DIAGNOSIS — N1330 Unspecified hydronephrosis: Secondary | ICD-10-CM

## 2013-06-22 ENCOUNTER — Encounter (INDEPENDENT_AMBULATORY_CARE_PROVIDER_SITE_OTHER): Payer: Self-pay

## 2013-06-22 ENCOUNTER — Ambulatory Visit (INDEPENDENT_AMBULATORY_CARE_PROVIDER_SITE_OTHER): Payer: Medicare Other | Admitting: Urology

## 2013-06-22 DIAGNOSIS — R82998 Other abnormal findings in urine: Secondary | ICD-10-CM

## 2013-06-22 DIAGNOSIS — N135 Crossing vessel and stricture of ureter without hydronephrosis: Secondary | ICD-10-CM

## 2013-06-22 DIAGNOSIS — N3941 Urge incontinence: Secondary | ICD-10-CM

## 2013-06-22 DIAGNOSIS — N269 Renal sclerosis, unspecified: Secondary | ICD-10-CM

## 2013-07-18 ENCOUNTER — Ambulatory Visit (HOSPITAL_COMMUNITY): Payer: Medicare Other

## 2013-07-18 ENCOUNTER — Encounter (HOSPITAL_COMMUNITY): Payer: Medicare Other | Attending: Oncology

## 2013-07-18 DIAGNOSIS — N1330 Unspecified hydronephrosis: Secondary | ICD-10-CM | POA: Insufficient documentation

## 2013-07-18 DIAGNOSIS — D631 Anemia in chronic kidney disease: Secondary | ICD-10-CM | POA: Insufficient documentation

## 2013-07-18 DIAGNOSIS — N184 Chronic kidney disease, stage 4 (severe): Secondary | ICD-10-CM | POA: Insufficient documentation

## 2013-07-18 DIAGNOSIS — C8290 Follicular lymphoma, unspecified, unspecified site: Secondary | ICD-10-CM

## 2013-07-18 DIAGNOSIS — Z452 Encounter for adjustment and management of vascular access device: Secondary | ICD-10-CM

## 2013-07-18 DIAGNOSIS — N039 Chronic nephritic syndrome with unspecified morphologic changes: Secondary | ICD-10-CM

## 2013-07-18 DIAGNOSIS — C8299 Follicular lymphoma, unspecified, extranodal and solid organ sites: Secondary | ICD-10-CM

## 2013-07-18 LAB — CBC WITH DIFFERENTIAL/PLATELET
Basophils Absolute: 0 10*3/uL (ref 0.0–0.1)
Basophils Relative: 0 % (ref 0–1)
Eosinophils Absolute: 0.1 10*3/uL (ref 0.0–0.7)
Eosinophils Relative: 2 % (ref 0–5)
HEMATOCRIT: 25.5 % — AB (ref 36.0–46.0)
Hemoglobin: 8.4 g/dL — ABNORMAL LOW (ref 12.0–15.0)
LYMPHS ABS: 0.7 10*3/uL (ref 0.7–4.0)
Lymphocytes Relative: 14 % (ref 12–46)
MCH: 35.1 pg — AB (ref 26.0–34.0)
MCHC: 32.9 g/dL (ref 30.0–36.0)
MCV: 106.7 fL — ABNORMAL HIGH (ref 78.0–100.0)
MONO ABS: 0.4 10*3/uL (ref 0.1–1.0)
MONOS PCT: 8 % (ref 3–12)
NEUTROS ABS: 3.9 10*3/uL (ref 1.7–7.7)
NEUTROS PCT: 76 % (ref 43–77)
Platelets: 182 10*3/uL (ref 150–400)
RBC: 2.39 MIL/uL — AB (ref 3.87–5.11)
RDW: 15.6 % — ABNORMAL HIGH (ref 11.5–15.5)
WBC: 5.1 10*3/uL (ref 4.0–10.5)

## 2013-07-18 LAB — COMPREHENSIVE METABOLIC PANEL
ALT: 11 U/L (ref 0–35)
AST: 17 U/L (ref 0–37)
Albumin: 3.2 g/dL — ABNORMAL LOW (ref 3.5–5.2)
Alkaline Phosphatase: 117 U/L (ref 39–117)
BILIRUBIN TOTAL: 0.2 mg/dL — AB (ref 0.3–1.2)
BUN: 23 mg/dL (ref 6–23)
CO2: 22 mEq/L (ref 19–32)
CREATININE: 2.13 mg/dL — AB (ref 0.50–1.10)
Calcium: 9.2 mg/dL (ref 8.4–10.5)
Chloride: 106 mEq/L (ref 96–112)
GFR calc Af Amer: 26 mL/min — ABNORMAL LOW (ref >90)
GFR calc non Af Amer: 22 mL/min — ABNORMAL LOW (ref >90)
Glucose, Bld: 255 mg/dL — ABNORMAL HIGH (ref 70–99)
Potassium: 4.2 mEq/L (ref 3.7–5.3)
Sodium: 142 mEq/L (ref 137–147)
Total Protein: 6.2 g/dL (ref 6.0–8.3)

## 2013-07-18 LAB — LACTATE DEHYDROGENASE: LDH: 163 U/L (ref 94–250)

## 2013-07-18 MED ORDER — HEPARIN SOD (PORK) LOCK FLUSH 100 UNIT/ML IV SOLN
500.0000 [IU] | Freq: Once | INTRAVENOUS | Status: AC
  Administered 2013-07-18: 500 [IU] via INTRAVENOUS
  Filled 2013-07-18: qty 5

## 2013-07-18 MED ORDER — SODIUM CHLORIDE 0.9 % IJ SOLN
10.0000 mL | INTRAMUSCULAR | Status: DC | PRN
  Administered 2013-07-18: 10 mL via INTRAVENOUS

## 2013-07-18 NOTE — Progress Notes (Signed)
Talayah L Pettinger presented for Portacath access and flush.  Proper placement of portacath confirmed by CXR.  Portacath located left chest wall accessed with  H 20 needle.  Good blood return present. Portacath flushed with 20ml NS and 500U/5ml Heparin and needle removed intact.  Procedure tolerated well and without incident.    

## 2013-07-19 LAB — BETA 2 MICROGLOBULIN, SERUM: BETA 2 MICROGLOBULIN: 5.47 mg/L — AB (ref 1.01–1.73)

## 2013-07-20 ENCOUNTER — Encounter (HOSPITAL_COMMUNITY): Payer: Self-pay

## 2013-07-20 ENCOUNTER — Encounter (HOSPITAL_BASED_OUTPATIENT_CLINIC_OR_DEPARTMENT_OTHER): Payer: Medicare Other

## 2013-07-20 VITALS — BP 142/72 | HR 82 | Temp 97.7°F | Resp 18 | Wt 213.3 lb

## 2013-07-20 DIAGNOSIS — D631 Anemia in chronic kidney disease: Secondary | ICD-10-CM

## 2013-07-20 DIAGNOSIS — N189 Chronic kidney disease, unspecified: Secondary | ICD-10-CM

## 2013-07-20 DIAGNOSIS — N1330 Unspecified hydronephrosis: Secondary | ICD-10-CM

## 2013-07-20 DIAGNOSIS — C8290 Follicular lymphoma, unspecified, unspecified site: Secondary | ICD-10-CM

## 2013-07-20 DIAGNOSIS — N184 Chronic kidney disease, stage 4 (severe): Secondary | ICD-10-CM

## 2013-07-20 DIAGNOSIS — N039 Chronic nephritic syndrome with unspecified morphologic changes: Secondary | ICD-10-CM

## 2013-07-20 DIAGNOSIS — L539 Erythematous condition, unspecified: Secondary | ICD-10-CM

## 2013-07-20 DIAGNOSIS — C8299 Follicular lymphoma, unspecified, extranodal and solid organ sites: Secondary | ICD-10-CM

## 2013-07-20 MED ORDER — DARBEPOETIN ALFA-POLYSORBATE 200 MCG/0.4ML IJ SOLN
INTRAMUSCULAR | Status: AC
  Filled 2013-07-20: qty 0.4

## 2013-07-20 MED ORDER — METOCLOPRAMIDE HCL 5 MG PO TABS: 10.0000 mg | ORAL_TABLET | Freq: Three times a day (TID) | ORAL | Status: DC

## 2013-07-20 MED ORDER — DARBEPOETIN ALFA-POLYSORBATE 200 MCG/0.4ML IJ SOLN
200.0000 ug | Freq: Once | INTRAMUSCULAR | Status: AC
  Administered 2013-07-20: 200 ug via SUBCUTANEOUS

## 2013-07-20 NOTE — Patient Instructions (Addendum)
[facility] Discharge Instructions  RECOMMENDATIONS MADE BY THE CONSULTANT AND ANY TEST RESULTS WILL BE SENT TO YOUR REFERRING PHYSICIAN.  EXAM FINDINGS BY THE PHYSICIAN TODAY AND SIGNS OR SYMPTOMS TO REPORT TO CLINIC OR PRIMARY PHYSICIAN: Exam and findings as discussed by Barnet Glasgow.  We will start you on maintenance Rituxan next week and you will get this every 90 days.  Report fevers, chills, or other problems.  MEDICATIONS PRESCRIBED:  Will give you Aranesp today  Because your hemoglobin is 8.6 which is low. Tylenol 650 mg and Benadryl 50 mg 1 hour before you get your Rituxan.  INSTRUCTIONS/FOLLOW-UP: Next week for Chemotherapy and to see MD.  Thank you for choosing [facility] to provide your oncology and hematology care.  To afford each patient quality time with our providers, please arrive at least 15 minutes before your scheduled appointment time.  With your help, our goal is to use those 15 minutes to complete the necessary work-up to ensure our physicians have the information they need to help with your evaluation and healthcare recommendations.    Effective January 1st, 2014, we ask that you re-schedule your appointment with our physicians should you arrive 10 or more minutes late for your appointment.  We strive to give you quality time with our providers, and arriving late affects you and other patients whose appointments are after yours.    Again, thank you for choosing [facility].  Our hope is that these requests will decrease the amount of time that you wait before being seen by our physicians.       _____________________________________________________________  Should you have questions after your visit to [facility], please contact our office at [phone] between the hours of 8:30 a.m. and 5:00 p.m.  Voicemails left after 4:30 p.m. will not be returned until the following business day.  For prescription refill  requests, have your pharmacy contact our office with your prescription refill request.

## 2013-07-20 NOTE — Progress Notes (Signed)
[name] [name] presents today for injection per MD orders. Aranesp 219mcg administered SQ in right Abdomen. Administration without incident. Patient tolerated well.

## 2013-07-20 NOTE — Addendum Note (Signed)
Addended by: Mellissa Kohut on: 07/20/2013 10:57 AM   Modules accepted: Orders

## 2013-07-20 NOTE — Progress Notes (Signed)
[place]  OFFICE PROGRESS NOTE  [name], MD [address] [place] [address]  DIAGNOSIS: Follicular lymphoma  Hydronephrosis of right kidney - Plan: CBC with Differential  Anemia of renal disease - Plan: CBC with Differential  Leg erythema  Chief Complaint  Patient presents with  . Lymphoma    CURRENT THERAPY: Recently completed 6 cycles of bendamustine plus Rituxan in October 2014  INTERVAL HISTORY: [name] [name] 73 y.o. female returns for followup of follicular lymphoma treated with 6 cycles of bendamustine plus Rituxan ending in October of 2015 at which time CT scan showed no evidence of disease but with persistent right hydronephrosis with stenting. She develops nausea when she begins T. and as improvement in that symptom when she stops eating. She's also had an area on the right lower extremity that appeared to be reddened. She was placed on antibiotics by her PCP with improvement. She denies any chest pain, PND, orthopnea, palpitations, headache, worsening lower extremity swelling or redness, persistent diarrhea, abdominal pain, fever, night sweats, easy satiety, headache, or seizures. In the presence of her husband she was told that CAT scan done in October 2014 failed to reveal any evidence of lymphoma and that she would be a candidate for Rituxan given every 3 months for 2 years as maintenance therapy. She is willing to go ahead with this intervention.  MEDICAL HISTORY: Past Medical History  Diagnosis Date  . Hypertension   . Diabetes mellitus, type 2   . Hyperlipidemia   . Iron deficiency anemia 11/21/2012    At time of presentation.  Feraheme 1020 mg on 11/21/12.  . Follicular lymphoma 11/17/3265    Right ureteral obstruction by lymphadenopathy-> right hydronephrosis  . Port catheter in place 06/06/2013    INTERIM HISTORY: has Follicular lymphoma; Iron deficiency anemia; Chronic kidney disease (CKD),  stage IV (severe); Diabetes mellitus type 2 in obese; Hypertension; UTI (urinary tract infection); Leukocytosis, unspecified; Hypoglycemia; and Port catheter in place on her problem list.    ALLERGIES:  has No Known Allergies.  MEDICATIONS: has a current medication list which includes the following prescription(s): acetaminophen, atorvastatin, febuxostat, insulin detemir, lidocaine-prilocaine, multivitamin with minerals, dexamethasone, fluconazole, furosemide, ondansetron, and phenazopyridine.  SURGICAL HISTORY:  Past Surgical History  Procedure Laterality Date  . Cataract extraction    . Cystoscopy w/ ureteral stent placement Right 11/03/2012    Procedure: CYSTOSCOPY WITH RETROGRADE PYELOGRAM/URETERAL STENT PLACEMENT;  Surgeon: Malka So, MD;  Location: [facility];  Service: Urology;  Laterality: Right;  . Portacath placement Left 11/03/2012    Procedure: INSERTION PORT-A-CATH;  Surgeon: Scherry Ran, MD;  Location: [facility];  Service: General;  Laterality: Left;  Insertion Port-A-Cath Left Subclavian  . Appendectomy    . Cystoscopy w/ ureteral stent placement Right 03/23/2013    Procedure: CYSTOSCOPY WITH STENT REPLACEMENT;  Surgeon: Malka So, MD;  Location: [facility];  Service: Urology;  Laterality: Right;    FAMILY HISTORY: family history includes Cancer in her brother and father.  SOCIAL HISTORY:  reports that she has never smoked. She has never used smokeless tobacco. She reports that she does not drink alcohol or use illicit drugs.  REVIEW OF SYSTEMS:  Other than that discussed above is noncontributory.  PHYSICAL EXAMINATION: ECOG PERFORMANCE STATUS: 1 - Symptomatic but completely ambulatory  Blood pressure 142/72, pulse 82, temperature 97.7 F (36.5 C), temperature source Oral, resp. rate 18, weight 213 lb 4.8 oz (96.752 kg).  GENERAL:alert, no distress and comfortable SKIN: skin color, texture, turgor are normal, no rashes or significant lesions EYES: PERLA; Conjunctiva are  pink and non-injected, sclera clear OROPHARYNX:no exudate, no erythema on lips, buccal mucosa, or tongue. NECK: supple, thyroid normal size, non-tender, without nodularity. No masses CHEST: Normal AP diameter with light port in place. No breast masses. LYMPH:  no palpable lymphadenopathy in the cervical, axillary or inguinal LUNGS: clear to auscultation and percussion with normal breathing effort HEART: regular rate & rhythm and no murmurs. ABDOMEN:abdomen soft, non-tender and normal bowel sounds MUSCULOSKELETAL:no cyanosis of digits and no clubbing. Range of motion normal. Bilateral lower extremity stasis dermatitis changes with an area of minimal redness in the middle of the right shin which is less intense than before according to the patient. NEURO: alert & oriented x 3 with fluent speech, no focal motor/sensory deficits.   LABORATORY DATA: Infusion on 07/18/2013  Component Date Value Range Status  . WBC 07/18/2013 5.1  4.0 - 10.5 K/uL Final  . RBC 07/18/2013 2.39* 3.87 - 5.11 MIL/uL Final  . Hemoglobin 07/18/2013 8.4* 12.0 - 15.0 g/dL Final  . HCT 07/18/2013 25.5* 36.0 - 46.0 % Final  . MCV 07/18/2013 106.7* 78.0 - 100.0 fL Final  . MCH 07/18/2013 35.1* 26.0 - 34.0 pg Final  . MCHC 07/18/2013 32.9  30.0 - 36.0 g/dL Final  . RDW 07/18/2013 15.6* 11.5 - 15.5 % Final  . Platelets 07/18/2013 182  150 - 400 K/uL Final  . Neutrophils Relative % 07/18/2013 76  43 - 77 % Final  . Neutro Abs 07/18/2013 3.9  1.7 - 7.7 K/uL Final  . Lymphocytes Relative 07/18/2013 14  12 - 46 % Final  . Lymphs Abs 07/18/2013 0.7  0.7 - 4.0 K/uL Final  . Monocytes Relative 07/18/2013 8  3 - 12 % Final  . Monocytes Absolute 07/18/2013 0.4  0.1 - 1.0 K/uL Final  . Eosinophils Relative 07/18/2013 2  0 - 5 % Final  . Eosinophils Absolute 07/18/2013 0.1  0.0 - 0.7 K/uL Final  . Basophils Relative 07/18/2013 0  0 - 1 % Final  . Basophils Absolute 07/18/2013 0.0  0.0 - 0.1 K/uL Final  . Sodium 07/18/2013 142  137  - 147 mEq/L Final  . Potassium 07/18/2013 4.2  3.7 - 5.3 mEq/L Final  . Chloride 07/18/2013 106  96 - 112 mEq/L Final  . CO2 07/18/2013 22  19 - 32 mEq/L Final  . Glucose, Bld 07/18/2013 255* 70 - 99 mg/dL Final  . BUN 07/18/2013 23  6 - 23 mg/dL Final  . Creatinine, Ser 07/18/2013 2.13* 0.50 - 1.10 mg/dL Final  . Calcium 07/18/2013 9.2  8.4 - 10.5 mg/dL Final  . Total Protein 07/18/2013 6.2  6.0 - 8.3 g/dL Final  . Albumin 07/18/2013 3.2* 3.5 - 5.2 g/dL Final  . AST 07/18/2013 17  0 - 37 U/L Final  . ALT 07/18/2013 11  0 - 35 U/L Final  . Alkaline Phosphatase 07/18/2013 117  39 - 117 U/L Final  . Total Bilirubin 07/18/2013 0.2* 0.3 - 1.2 mg/dL Final  . GFR calc non Af Amer 07/18/2013 22* >90 mL/min Final  . GFR calc Af Amer 07/18/2013 26* >90 mL/min Final   Comment: (NOTE)                          The eGFR has been calculated using the CKD EPI equation.  This calculation has not been validated in all clinical situations.                          eGFR's persistently <90 mL/min signify possible Chronic Kidney                          Disease.  . Beta-2 Microglobulin 07/18/2013 5.47* 1.01 - 1.73 mg/L Final   Performed at [facility]  . LDH 07/18/2013 163  94 - 250 U/L Final    PATHOLOGY: No new pathology.  Urinalysis    Component Value Date/Time   COLORURINE YELLOW 04/24/2013 1600   APPEARANCEUR HAZY* 04/24/2013 1600   LABSPEC 1.015 04/24/2013 1600   PHURINE 6.5 04/24/2013 1600   GLUCOSEU >1000* 04/24/2013 1600   HGBUR LARGE* 04/24/2013 1600   BILIRUBINUR NEGATIVE 04/24/2013 1600   KETONESUR NEGATIVE 04/24/2013 1600   PROTEINUR TRACE* 04/24/2013 1600   UROBILINOGEN 0.2 04/24/2013 1600   NITRITE NEGATIVE 04/24/2013 1600   LEUKOCYTESUR MODERATE* 04/24/2013 1600    RADIOGRAPHIC STUDIES: US Renal  06/20/2013   CLINICAL DATA:  Evaluate for obstructive uropathy  EXAM: RENAL/URINARY TRACT ULTRASOUND COMPLETE  COMPARISON:  06/05/2013.  FINDINGS:  Right Kidney:  Length: 6.5 cm. There is slight decreased corticomedullary differentiation and diffuse cortical thinning. There is no evidence of hydronephrosis, solid or cystic masses, nor calculi.  Left Kidney:  Length: 10.5 cm. Echogenicity within normal limits. No mass or hydronephrosis visualized.  Bladder:  Urinary bladder is decompressed.  IMPRESSION: There is atrophy of the right kidney slightly more prominent when compared to previous study the likely secondary to variability in technique. Findings are also consistent with sequela of medical renal disease without evidence of obstructive uropathy. The left kidney is unremarkable.   Electronically Signed   By: Margaree Mackintosh M.D.   On: 06/20/2013 14:24    ASSESSMENT:  #1. Follicular lymphoma, status post 6 cycles of bendamustine and Rituxan, no evidence of disease. To begin maintenance Rituxan on 07/25/2013  #2. Persistent right hydronephrosis, status post stenting with continued urinary symptoms.  #3. Hypertension, controlled.  #4. Diabetes mellitus, type II, non-insulin requiring, controlled.  #5. Iron deficiency anemia, status post Feraheme in May of 2014, stable with hemoglobin of 8.4 on 07/18/2013. #6. 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    PLAN:  #1. Aranesp 200 mcg subcutaneously today and every 2 weeks until hemoglobin reaches 11. #2. Rituxan 500 mg per meter squared on 07/25/2013 and every 3 months thereafter #3. Complete antibiotic therapy ordered by PCP. #4. Followup on 07/25/2013   All questions were answered. The patient knows to call the clinic with any problems, questions or concerns. We can certainly see the patient much sooner if necessary.   I spent 25 minutes counseling the patient face to face. The total time spent in the appointment was 30 minutes.    Doroteo Bradford, MD 07/20/2013 9:58 AM

## 2013-07-24 ENCOUNTER — Other Ambulatory Visit (HOSPITAL_COMMUNITY): Payer: Self-pay | Admitting: Hematology and Oncology

## 2013-07-25 ENCOUNTER — Ambulatory Visit (HOSPITAL_COMMUNITY): Payer: Medicare Other

## 2013-07-25 ENCOUNTER — Encounter (HOSPITAL_BASED_OUTPATIENT_CLINIC_OR_DEPARTMENT_OTHER): Payer: Medicare Other

## 2013-07-25 VITALS — BP 143/56 | HR 67 | Temp 97.4°F | Resp 16 | Wt 210.4 lb

## 2013-07-25 DIAGNOSIS — Z5112 Encounter for antineoplastic immunotherapy: Secondary | ICD-10-CM

## 2013-07-25 DIAGNOSIS — C8290 Follicular lymphoma, unspecified, unspecified site: Secondary | ICD-10-CM

## 2013-07-25 DIAGNOSIS — C8299 Follicular lymphoma, unspecified, extranodal and solid organ sites: Secondary | ICD-10-CM

## 2013-07-25 MED ORDER — DIPHENHYDRAMINE HCL 25 MG PO CAPS
50.0000 mg | ORAL_CAPSULE | Freq: Once | ORAL | Status: AC
  Administered 2013-07-25: 50 mg via ORAL

## 2013-07-25 MED ORDER — SODIUM CHLORIDE 0.9 % IV SOLN
500.0000 mg/m2 | Freq: Once | INTRAVENOUS | Status: AC
  Administered 2013-07-25: 1100 mg via INTRAVENOUS
  Filled 2013-07-25: qty 110

## 2013-07-25 MED ORDER — HEPARIN SOD (PORK) LOCK FLUSH 100 UNIT/ML IV SOLN
500.0000 [IU] | Freq: Once | INTRAVENOUS | Status: AC | PRN
  Administered 2013-07-25: 500 [IU]
  Filled 2013-07-25 (×2): qty 5

## 2013-07-25 MED ORDER — SODIUM CHLORIDE 0.9 % IJ SOLN: 10.0000 mL | INTRAMUSCULAR | Status: DC | PRN

## 2013-07-25 MED ORDER — DIPHENHYDRAMINE HCL 25 MG PO CAPS
ORAL_CAPSULE | ORAL | Status: AC
  Filled 2013-07-25: qty 2

## 2013-07-25 MED ORDER — SODIUM CHLORIDE 0.9 % IV SOLN
Freq: Once | INTRAVENOUS | Status: AC
  Administered 2013-07-25: 500 mL via INTRAVENOUS

## 2013-07-25 MED ORDER — ACETAMINOPHEN 325 MG PO TABS: 650.0000 mg | ORAL_TABLET | Freq: Once | ORAL | Status: DC

## 2013-07-25 MED ORDER — ACETAMINOPHEN 325 MG PO TABS
ORAL_TABLET | ORAL | Status: AC
  Filled 2013-07-25: qty 2

## 2013-07-25 NOTE — Progress Notes (Signed)
Tolerated well

## 2013-08-03 ENCOUNTER — Encounter (HOSPITAL_BASED_OUTPATIENT_CLINIC_OR_DEPARTMENT_OTHER): Payer: Medicare Other

## 2013-08-03 VITALS — BP 177/56 | HR 63 | Resp 20

## 2013-08-03 DIAGNOSIS — D631 Anemia in chronic kidney disease: Secondary | ICD-10-CM

## 2013-08-03 DIAGNOSIS — N184 Chronic kidney disease, stage 4 (severe): Secondary | ICD-10-CM

## 2013-08-03 DIAGNOSIS — N039 Chronic nephritic syndrome with unspecified morphologic changes: Secondary | ICD-10-CM

## 2013-08-03 LAB — CBC
HEMATOCRIT: 29.1 % — AB (ref 36.0–46.0)
HEMOGLOBIN: 9.5 g/dL — AB (ref 12.0–15.0)
MCH: 34.4 pg — ABNORMAL HIGH (ref 26.0–34.0)
MCHC: 32.6 g/dL (ref 30.0–36.0)
MCV: 105.4 fL — AB (ref 78.0–100.0)
Platelets: 187 10*3/uL (ref 150–400)
RBC: 2.76 MIL/uL — ABNORMAL LOW (ref 3.87–5.11)
RDW: 15.4 % (ref 11.5–15.5)
WBC: 6 10*3/uL (ref 4.0–10.5)

## 2013-08-03 MED ORDER — DARBEPOETIN ALFA-POLYSORBATE 200 MCG/0.4ML IJ SOLN
200.0000 ug | Freq: Once | INTRAMUSCULAR | Status: AC
  Administered 2013-08-03: 200 ug via SUBCUTANEOUS

## 2013-08-03 MED ORDER — DARBEPOETIN ALFA-POLYSORBATE 200 MCG/0.4ML IJ SOLN
INTRAMUSCULAR | Status: AC
  Filled 2013-08-03: qty 0.4

## 2013-08-03 NOTE — Progress Notes (Signed)
Labs drawn today for cbc 

## 2013-08-03 NOTE — Progress Notes (Signed)
[place] presents today for injection per MD orders. Aranesp 200 mcg administered SQ in right lower abdomen. Administration without incident. Patient tolerated well.

## 2013-08-29 ENCOUNTER — Encounter (HOSPITAL_COMMUNITY): Payer: Medicare Other | Attending: Oncology

## 2013-08-29 VITALS — BP 135/86 | HR 65 | Resp 20 | Wt 211.1 lb

## 2013-08-29 DIAGNOSIS — D631 Anemia in chronic kidney disease: Secondary | ICD-10-CM

## 2013-08-29 DIAGNOSIS — C8299 Follicular lymphoma, unspecified, extranodal and solid organ sites: Secondary | ICD-10-CM | POA: Insufficient documentation

## 2013-08-29 DIAGNOSIS — D509 Iron deficiency anemia, unspecified: Secondary | ICD-10-CM

## 2013-08-29 DIAGNOSIS — N184 Chronic kidney disease, stage 4 (severe): Secondary | ICD-10-CM

## 2013-08-29 DIAGNOSIS — N039 Chronic nephritic syndrome with unspecified morphologic changes: Secondary | ICD-10-CM

## 2013-08-29 DIAGNOSIS — C8290 Follicular lymphoma, unspecified, unspecified site: Secondary | ICD-10-CM

## 2013-08-29 LAB — CBC WITH DIFFERENTIAL/PLATELET
Basophils Absolute: 0 10*3/uL (ref 0.0–0.1)
Basophils Relative: 0 % (ref 0–1)
EOS ABS: 0.1 10*3/uL (ref 0.0–0.7)
Eosinophils Relative: 1 % (ref 0–5)
HCT: 23.7 % — ABNORMAL LOW (ref 36.0–46.0)
HEMOGLOBIN: 8.1 g/dL — AB (ref 12.0–15.0)
LYMPHS ABS: 0.5 10*3/uL — AB (ref 0.7–4.0)
Lymphocytes Relative: 12 % (ref 12–46)
MCH: 35.7 pg — ABNORMAL HIGH (ref 26.0–34.0)
MCHC: 34.2 g/dL (ref 30.0–36.0)
MCV: 104.4 fL — AB (ref 78.0–100.0)
MONOS PCT: 8 % (ref 3–12)
Monocytes Absolute: 0.3 10*3/uL (ref 0.1–1.0)
Neutro Abs: 3.1 10*3/uL (ref 1.7–7.7)
Neutrophils Relative %: 79 % — ABNORMAL HIGH (ref 43–77)
Platelets: 126 10*3/uL — ABNORMAL LOW (ref 150–400)
RBC: 2.27 MIL/uL — AB (ref 3.87–5.11)
RDW: 15 % (ref 11.5–15.5)
WBC: 3.9 10*3/uL — ABNORMAL LOW (ref 4.0–10.5)

## 2013-08-29 LAB — COMPREHENSIVE METABOLIC PANEL
ALT: 16 U/L (ref 0–35)
AST: 21 U/L (ref 0–37)
Albumin: 3.1 g/dL — ABNORMAL LOW (ref 3.5–5.2)
Alkaline Phosphatase: 110 U/L (ref 39–117)
BUN: 35 mg/dL — AB (ref 6–23)
CALCIUM: 9 mg/dL (ref 8.4–10.5)
CO2: 22 mEq/L (ref 19–32)
Chloride: 104 mEq/L (ref 96–112)
Creatinine, Ser: 1.87 mg/dL — ABNORMAL HIGH (ref 0.50–1.10)
GFR calc Af Amer: 30 mL/min — ABNORMAL LOW (ref >90)
GFR calc non Af Amer: 26 mL/min — ABNORMAL LOW (ref >90)
GLUCOSE: 230 mg/dL — AB (ref 70–99)
Potassium: 4.2 mEq/L (ref 3.7–5.3)
Sodium: 138 mEq/L (ref 137–147)
TOTAL PROTEIN: 6.1 g/dL (ref 6.0–8.3)
Total Bilirubin: 0.2 mg/dL — ABNORMAL LOW (ref 0.3–1.2)

## 2013-08-29 LAB — LACTATE DEHYDROGENASE: LDH: 245 U/L (ref 94–250)

## 2013-08-29 MED ORDER — DARBEPOETIN ALFA-POLYSORBATE 200 MCG/0.4ML IJ SOLN
200.0000 ug | Freq: Once | INTRAMUSCULAR | Status: AC
  Administered 2013-08-29: 200 ug via SUBCUTANEOUS

## 2013-08-29 MED ORDER — HEPARIN SOD (PORK) LOCK FLUSH 100 UNIT/ML IV SOLN
500.0000 [IU] | Freq: Once | INTRAVENOUS | Status: AC
  Administered 2013-08-29: 500 [IU] via INTRAVENOUS

## 2013-08-29 MED ORDER — HEPARIN SOD (PORK) LOCK FLUSH 100 UNIT/ML IV SOLN
INTRAVENOUS | Status: AC
Start: 2013-08-29 — End: 2013-08-29
  Filled 2013-08-29: qty 5

## 2013-08-29 MED ORDER — SODIUM CHLORIDE 0.9 % IJ SOLN
10.0000 mL | INTRAMUSCULAR | Status: DC | PRN
  Administered 2013-08-29: 10 mL via INTRAVENOUS

## 2013-08-29 MED ORDER — DARBEPOETIN ALFA-POLYSORBATE 200 MCG/0.4ML IJ SOLN
INTRAMUSCULAR | Status: AC
  Filled 2013-08-29: qty 0.4

## 2013-08-29 NOTE — Progress Notes (Signed)
[name] [name] presented for Portacath access and flush. Proper placement of portacath confirmed by CXR. Portacath located left chest wall accessed with  H 20 needle. Good blood return present. Portacath flushed with 39ml NS and 500U/35ml Heparin and needle removed intact. Procedure without incident. Patient tolerated procedure well.  [name] [name] presents today for injection per MD orders. Aranesp 200 mcg administered SQ in right Abdomen. Administration without incident. Patient tolerated well.

## 2013-08-30 LAB — BETA 2 MICROGLOBULIN, SERUM: Beta-2 Microglobulin: 4.83 mg/L — ABNORMAL HIGH (ref 1.01–1.73)

## 2013-09-07 ENCOUNTER — Ambulatory Visit (INDEPENDENT_AMBULATORY_CARE_PROVIDER_SITE_OTHER): Payer: Medicare Other | Admitting: Urology

## 2013-09-07 DIAGNOSIS — N269 Renal sclerosis, unspecified: Secondary | ICD-10-CM

## 2013-09-07 DIAGNOSIS — Z87440 Personal history of urinary (tract) infections: Secondary | ICD-10-CM

## 2013-09-07 DIAGNOSIS — N3941 Urge incontinence: Secondary | ICD-10-CM

## 2013-09-12 ENCOUNTER — Encounter (HOSPITAL_COMMUNITY): Payer: Medicare Other

## 2013-09-12 ENCOUNTER — Encounter (HOSPITAL_COMMUNITY): Payer: Medicare Other | Attending: Oncology

## 2013-09-12 VITALS — BP 159/75 | HR 68 | Resp 20

## 2013-09-12 DIAGNOSIS — N184 Chronic kidney disease, stage 4 (severe): Secondary | ICD-10-CM | POA: Insufficient documentation

## 2013-09-12 DIAGNOSIS — N039 Chronic nephritic syndrome with unspecified morphologic changes: Secondary | ICD-10-CM

## 2013-09-12 DIAGNOSIS — D631 Anemia in chronic kidney disease: Secondary | ICD-10-CM

## 2013-09-12 LAB — CBC
HEMATOCRIT: 26.5 % — AB (ref 36.0–46.0)
Hemoglobin: 8.7 g/dL — ABNORMAL LOW (ref 12.0–15.0)
MCH: 34.4 pg — ABNORMAL HIGH (ref 26.0–34.0)
MCHC: 32.8 g/dL (ref 30.0–36.0)
MCV: 104.7 fL — AB (ref 78.0–100.0)
Platelets: 150 10*3/uL (ref 150–400)
RBC: 2.53 MIL/uL — ABNORMAL LOW (ref 3.87–5.11)
RDW: 14.8 % (ref 11.5–15.5)
WBC: 3.7 10*3/uL — ABNORMAL LOW (ref 4.0–10.5)

## 2013-09-12 MED ORDER — DARBEPOETIN ALFA-POLYSORBATE 200 MCG/0.4ML IJ SOLN
200.0000 ug | Freq: Once | INTRAMUSCULAR | Status: AC
  Administered 2013-09-12: 200 ug via SUBCUTANEOUS

## 2013-09-12 MED ORDER — DARBEPOETIN ALFA-POLYSORBATE 200 MCG/0.4ML IJ SOLN
INTRAMUSCULAR | Status: AC
  Filled 2013-09-12: qty 0.4

## 2013-09-12 NOTE — Progress Notes (Signed)
[name] [name] presents today for injection per MD orders. Aranesp 200 mcg administered SQ in left Abdomen. Administration without incident. Patient tolerated well. [name] [name] presented for labwork. Labs per MD order drawn via Peripheral Line 23 gauge needle inserted in left antecubital.  Good blood return present. Procedure without incident.  Needle removed intact. Patient tolerated procedure well.

## 2013-09-26 ENCOUNTER — Encounter (HOSPITAL_COMMUNITY): Payer: Medicare Other | Attending: Hematology and Oncology

## 2013-09-26 VITALS — BP 146/70 | HR 71 | Resp 18

## 2013-09-26 DIAGNOSIS — N039 Chronic nephritic syndrome with unspecified morphologic changes: Secondary | ICD-10-CM

## 2013-09-26 DIAGNOSIS — N184 Chronic kidney disease, stage 4 (severe): Secondary | ICD-10-CM | POA: Insufficient documentation

## 2013-09-26 DIAGNOSIS — D631 Anemia in chronic kidney disease: Secondary | ICD-10-CM

## 2013-09-26 LAB — CBC
HEMATOCRIT: 27.5 % — AB (ref 36.0–46.0)
Hemoglobin: 9 g/dL — ABNORMAL LOW (ref 12.0–15.0)
MCH: 33.8 pg (ref 26.0–34.0)
MCHC: 32.7 g/dL (ref 30.0–36.0)
MCV: 103.4 fL — ABNORMAL HIGH (ref 78.0–100.0)
Platelets: 188 10*3/uL (ref 150–400)
RBC: 2.66 MIL/uL — ABNORMAL LOW (ref 3.87–5.11)
RDW: 15 % (ref 11.5–15.5)
WBC: 4.4 10*3/uL (ref 4.0–10.5)

## 2013-09-26 MED ORDER — DARBEPOETIN ALFA-POLYSORBATE 200 MCG/0.4ML IJ SOLN
INTRAMUSCULAR | Status: AC
  Filled 2013-09-26: qty 0.4

## 2013-09-26 MED ORDER — DARBEPOETIN ALFA-POLYSORBATE 200 MCG/0.4ML IJ SOLN
200.0000 ug | Freq: Once | INTRAMUSCULAR | Status: AC
  Administered 2013-09-26: 200 ug via SUBCUTANEOUS

## 2013-09-26 NOTE — Progress Notes (Signed)
Harvin Hazel Snedden's reason for visit today is for an injection and labs as scheduled per MD orders.  Labs were drawn prior to administration of ordered medication.  Venipuncture performed with a 23 gauge butterfly needle to [name] [name] also received aranesp 200 mcg per MD orders; see [facility] for administration details.  [name] tolerated all procedures well and without incident; questions were answered and patient was discharged.

## 2013-10-10 ENCOUNTER — Encounter (HOSPITAL_BASED_OUTPATIENT_CLINIC_OR_DEPARTMENT_OTHER): Payer: Medicare Other

## 2013-10-10 ENCOUNTER — Encounter (HOSPITAL_COMMUNITY): Payer: Medicare Other | Attending: Oncology

## 2013-10-10 VITALS — BP 170/78 | HR 66 | Resp 18

## 2013-10-10 DIAGNOSIS — N184 Chronic kidney disease, stage 4 (severe): Secondary | ICD-10-CM

## 2013-10-10 DIAGNOSIS — N039 Chronic nephritic syndrome with unspecified morphologic changes: Principal | ICD-10-CM

## 2013-10-10 DIAGNOSIS — D631 Anemia in chronic kidney disease: Secondary | ICD-10-CM

## 2013-10-10 DIAGNOSIS — C8299 Follicular lymphoma, unspecified, extranodal and solid organ sites: Secondary | ICD-10-CM | POA: Insufficient documentation

## 2013-10-10 DIAGNOSIS — N189 Chronic kidney disease, unspecified: Secondary | ICD-10-CM

## 2013-10-10 LAB — CBC
HCT: 27.3 % — ABNORMAL LOW (ref 36.0–46.0)
HEMOGLOBIN: 8.6 g/dL — AB (ref 12.0–15.0)
MCH: 32.3 pg (ref 26.0–34.0)
MCHC: 31.5 g/dL (ref 30.0–36.0)
MCV: 102.6 fL — ABNORMAL HIGH (ref 78.0–100.0)
Platelets: 154 10*3/uL (ref 150–400)
RBC: 2.66 MIL/uL — ABNORMAL LOW (ref 3.87–5.11)
RDW: 15.2 % (ref 11.5–15.5)
WBC: 3.4 10*3/uL — ABNORMAL LOW (ref 4.0–10.5)

## 2013-10-10 MED ORDER — DARBEPOETIN ALFA-POLYSORBATE 500 MCG/ML IJ SOLN
500.0000 ug | Freq: Once | INTRAMUSCULAR | Status: AC
  Administered 2013-10-10: 500 ug via SUBCUTANEOUS

## 2013-10-10 MED ORDER — DARBEPOETIN ALFA-POLYSORBATE 500 MCG/ML IJ SOLN
INTRAMUSCULAR | Status: AC
  Filled 2013-10-10: qty 1

## 2013-10-10 NOTE — Progress Notes (Signed)
[name] [name] presented for labwork. Labs per MD order drawn via Peripheral Line 23 gauge needle inserted in left antecubital.  Good blood return present. Procedure without incident.  Needle removed intact. Patient tolerated procedure well.  [name] [name] presents today for injection per MD orders. Aranesp 500 mcg administered SQ in right Abdomen. Administration without incident. Patient tolerated well.

## 2013-10-12 NOTE — Progress Notes (Signed)
Labs drawn via PIV stick.  

## 2013-10-24 ENCOUNTER — Encounter (HOSPITAL_COMMUNITY): Payer: Medicare Other

## 2013-10-25 ENCOUNTER — Encounter (HOSPITAL_BASED_OUTPATIENT_CLINIC_OR_DEPARTMENT_OTHER): Payer: Medicare Other

## 2013-10-25 ENCOUNTER — Encounter (HOSPITAL_COMMUNITY): Payer: Self-pay

## 2013-10-25 ENCOUNTER — Encounter (HOSPITAL_COMMUNITY): Payer: Medicare Other | Attending: Hematology and Oncology

## 2013-10-25 ENCOUNTER — Encounter (HOSPITAL_COMMUNITY): Payer: Medicare Other

## 2013-10-25 VITALS — BP 160/68 | HR 57 | Temp 97.3°F | Resp 16

## 2013-10-25 VITALS — BP 143/62 | HR 77 | Temp 96.8°F | Resp 18 | Wt 211.0 lb

## 2013-10-25 DIAGNOSIS — C8299 Follicular lymphoma, unspecified, extranodal and solid organ sites: Secondary | ICD-10-CM

## 2013-10-25 DIAGNOSIS — N039 Chronic nephritic syndrome with unspecified morphologic changes: Secondary | ICD-10-CM

## 2013-10-25 DIAGNOSIS — R300 Dysuria: Secondary | ICD-10-CM

## 2013-10-25 DIAGNOSIS — C8590 Non-Hodgkin lymphoma, unspecified, unspecified site: Secondary | ICD-10-CM

## 2013-10-25 DIAGNOSIS — E119 Type 2 diabetes mellitus without complications: Secondary | ICD-10-CM

## 2013-10-25 DIAGNOSIS — N184 Chronic kidney disease, stage 4 (severe): Secondary | ICD-10-CM

## 2013-10-25 DIAGNOSIS — D631 Anemia in chronic kidney disease: Secondary | ICD-10-CM

## 2013-10-25 DIAGNOSIS — N189 Chronic kidney disease, unspecified: Principal | ICD-10-CM

## 2013-10-25 DIAGNOSIS — Z5112 Encounter for antineoplastic immunotherapy: Secondary | ICD-10-CM

## 2013-10-25 DIAGNOSIS — C8290 Follicular lymphoma, unspecified, unspecified site: Secondary | ICD-10-CM

## 2013-10-25 DIAGNOSIS — R6884 Jaw pain: Secondary | ICD-10-CM

## 2013-10-25 DIAGNOSIS — I10 Essential (primary) hypertension: Secondary | ICD-10-CM

## 2013-10-25 LAB — CBC WITH DIFFERENTIAL/PLATELET
Basophils Absolute: 0 10*3/uL (ref 0.0–0.1)
Basophils Relative: 0 % (ref 0–1)
Eosinophils Absolute: 0.1 10*3/uL (ref 0.0–0.7)
Eosinophils Relative: 3 % (ref 0–5)
HCT: 25.7 % — ABNORMAL LOW (ref 36.0–46.0)
Hemoglobin: 8.3 g/dL — ABNORMAL LOW (ref 12.0–15.0)
Lymphocytes Relative: 13 % (ref 12–46)
Lymphs Abs: 0.4 10*3/uL — ABNORMAL LOW (ref 0.7–4.0)
MCH: 33.1 pg (ref 26.0–34.0)
MCHC: 32.3 g/dL (ref 30.0–36.0)
MCV: 102.4 fL — ABNORMAL HIGH (ref 78.0–100.0)
Monocytes Absolute: 0.3 10*3/uL (ref 0.1–1.0)
Monocytes Relative: 11 % (ref 3–12)
NEUTROS ABS: 2 10*3/uL (ref 1.7–7.7)
NEUTROS PCT: 73 % (ref 43–77)
Platelets: 153 10*3/uL (ref 150–400)
RBC: 2.51 MIL/uL — AB (ref 3.87–5.11)
RDW: 15.6 % — ABNORMAL HIGH (ref 11.5–15.5)
WBC: 2.8 10*3/uL — ABNORMAL LOW (ref 4.0–10.5)

## 2013-10-25 LAB — FERRITIN: Ferritin: 494 ng/mL — ABNORMAL HIGH (ref 10–291)

## 2013-10-25 MED ORDER — SODIUM CHLORIDE 0.9 % IJ SOLN
10.0000 mL | INTRAMUSCULAR | Status: DC | PRN
  Administered 2013-10-25: 10 mL

## 2013-10-25 MED ORDER — ACETAMINOPHEN 325 MG PO TABS
ORAL_TABLET | ORAL | Status: AC
  Filled 2013-10-25: qty 2

## 2013-10-25 MED ORDER — DIPHENHYDRAMINE HCL 25 MG PO CAPS: 50.0000 mg | ORAL_CAPSULE | Freq: Once | ORAL | Status: DC

## 2013-10-25 MED ORDER — SODIUM CHLORIDE 0.9 % IV SOLN
Freq: Once | INTRAVENOUS | Status: AC
  Administered 2013-10-25: 09:00:00 via INTRAVENOUS

## 2013-10-25 MED ORDER — SODIUM CHLORIDE 0.9 % IV SOLN
500.0000 mg/m2 | Freq: Once | INTRAVENOUS | Status: AC
  Administered 2013-10-25: 1100 mg via INTRAVENOUS
  Filled 2013-10-25: qty 100

## 2013-10-25 MED ORDER — ACETAMINOPHEN 325 MG PO TABS: 650.0000 mg | ORAL_TABLET | Freq: Once | ORAL | Status: DC

## 2013-10-25 MED ORDER — DIPHENHYDRAMINE HCL 25 MG PO CAPS
ORAL_CAPSULE | ORAL | Status: AC
  Filled 2013-10-25: qty 2

## 2013-10-25 MED ORDER — DARBEPOETIN ALFA-POLYSORBATE 500 MCG/ML IJ SOLN
500.0000 ug | Freq: Once | INTRAMUSCULAR | Status: AC
  Administered 2013-10-25: 500 ug via SUBCUTANEOUS
  Filled 2013-10-25: qty 1

## 2013-10-25 MED ORDER — HEPARIN SOD (PORK) LOCK FLUSH 100 UNIT/ML IV SOLN
500.0000 [IU] | Freq: Once | INTRAVENOUS | Status: AC | PRN
  Administered 2013-10-25: 500 [IU]
  Filled 2013-10-25: qty 5

## 2013-10-25 NOTE — Progress Notes (Signed)
0900- Port accessed; no blood return noted.  Flushes without difficulty; no c/o pain at site.  No s/s of infiltration.   [name] [name] presents today for injection per MD orders. Aranesp 500 mcg administered SQ in right lower abdomen. Administration without incident. Patient tolerated well.

## 2013-10-25 NOTE — Progress Notes (Signed)
[place]  OFFICE PROGRESS NOTE  [name], MD [address] [place] [address]  DIAGNOSIS: Anemia of renal disease - Plan: Ferritin  NHL (non-Hodgkin's lymphoma)  Jaw pain  Dysuria  Chief Complaint  Patient presents with  . Lymphoma    Rituxan maintenance cycle 2 today  . Chronic Kidney Disease    CURRENT THERAPY: Rituxan maintenance, cycle #2  today, having completed 6 cycles of bendamustine plus Rituxan in October 2014. First maintenance treatment was on 07/25/2013.  INTERVAL HISTORY: [name] [name] 73 y.o. female returns for continuation of maintenance Rituxan after definitive treatment for follicular lymphoma. She's had right shoulder mandibular pain for the last 2 or 3 weeks without gross swelling. She denies any fever or chills. She is a denies any night sweats. She's has experienced dysuria for the past 3 weeks as well as seen yesterday by a urologist who ordered medication which he has not yet taken. She denies any lower extremity swelling or redness, lymphadenopathy, easy satiety, sore throat, nasal drip, cough, wheezing, PND, orthopnea, palpitations, skin rash, headache, or seizures.  MEDICAL HISTORY: Past Medical History  Diagnosis Date  . Hypertension   . Diabetes mellitus, type 2   . Hyperlipidemia   . Iron deficiency anemia 11/21/2012    At time of presentation.  Feraheme 1020 mg on 11/21/12.  . Follicular lymphoma 0/04/2724    Right ureteral obstruction by lymphadenopathy-> right hydronephrosis  . Port catheter in place 06/06/2013    INTERIM HISTORY: has Follicular lymphoma; Iron deficiency anemia; Chronic kidney disease (CKD), stage IV (severe); Diabetes mellitus type 2 in obese; Hypertension; UTI (urinary tract infection); Leukocytosis, unspecified; Hypoglycemia; and Port catheter in place on her problem list.    ALLERGIES:  has No Known Allergies.  MEDICATIONS: has a current medication list which  includes the following prescription(s): acetaminophen, atorvastatin, febuxostat, insulin detemir, lidocaine-prilocaine, multivitamin with minerals, dexamethasone, fluconazole, furosemide, metoclopramide, and ondansetron, and the following Facility-Administered Medications: acetaminophen, diphenhydramine, heparin lock flush, riTUXimab (RITUXAN) 1,100 mg in sodium chloride 0.9 % 250 mL chemo infusion, and sodium chloride.  SURGICAL HISTORY:  Past Surgical History  Procedure Laterality Date  . Cataract extraction    . Cystoscopy w/ ureteral stent placement Right 11/03/2012    Procedure: CYSTOSCOPY WITH RETROGRADE PYELOGRAM/URETERAL STENT PLACEMENT;  Surgeon: Malka So, MD;  Location: [facility];  Service: Urology;  Laterality: Right;  . Portacath placement Left 11/03/2012    Procedure: INSERTION PORT-A-CATH;  Surgeon: Scherry Ran, MD;  Location: [facility];  Service: General;  Laterality: Left;  Insertion Port-A-Cath Left Subclavian  . Appendectomy    . Cystoscopy w/ ureteral stent placement Right 03/23/2013    Procedure: CYSTOSCOPY WITH STENT REPLACEMENT;  Surgeon: Malka So, MD;  Location: [facility];  Service: Urology;  Laterality: Right;    FAMILY HISTORY: family history includes Cancer in her brother and father.  SOCIAL HISTORY:  reports that she has never smoked. She has never used smokeless tobacco. She reports that she does not drink alcohol or use illicit drugs.  REVIEW OF SYSTEMS:  Other than that discussed above is noncontributory.  PHYSICAL EXAMINATION: ECOG PERFORMANCE STATUS: 1 - Symptomatic but completely ambulatory  Blood pressure 143/62, pulse 77, temperature 96.8 F (36 C), temperature source Oral, resp. rate 18, weight 211 lb (95.709 kg).  GENERAL:alert, no distress and comfortable SKIN: skin color, texture, turgor are normal, no rashes or significant lesions EYES: PERLA; Conjunctiva are pink and  non-injected, sclera clear SINUSES: No redness or tenderness over maxillary  or ethmoid sinuses. No rhinorrhea. OROPHARYNX:no exudate, no erythema on lips, buccal mucosa, or tongue. NECK: supple, thyroid normal size, non-tender, without nodularity. No masses CHEST: Normal AP diameter with light port in place and left anterior chest. LYMPH:  no palpable lymphadenopathy in the cervical, axillary or inguinal LUNGS: clear to auscultation and percussion with normal breathing effort HEART: regular rate & rhythm and no murmurs. ABDOMEN:abdomen soft, non-tender and normal bowel sounds. Soft obese with no palpable spleen. MUSCULOSKELETAL:no cyanosis of digits and no clubbing. Range of motion normal.  NEURO: alert & oriented x 3 with fluent speech, no focal motor/sensory deficits   LABORATORY DATA: Infusion on 10/10/2013  Component Date Value Ref Range Status  . WBC 10/10/2013 3.4* 4.0 - 10.5 K/uL Final  . RBC 10/10/2013 2.66* 3.87 - 5.11 MIL/uL Final  . Hemoglobin 10/10/2013 8.6* 12.0 - 15.0 g/dL Final  . HCT 10/10/2013 27.3* 36.0 - 46.0 % Final  . MCV 10/10/2013 102.6* 78.0 - 100.0 fL Final  . MCH 10/10/2013 32.3  26.0 - 34.0 pg Final  . MCHC 10/10/2013 31.5  30.0 - 36.0 g/dL Final  . RDW 10/10/2013 15.2  11.5 - 15.5 % Final  . Platelets 10/10/2013 154  150 - 400 K/uL Final  Appointment on 09/26/2013  Component Date Value Ref Range Status  . WBC 09/26/2013 4.4  4.0 - 10.5 K/uL Final  . RBC 09/26/2013 2.66* 3.87 - 5.11 MIL/uL Final  . Hemoglobin 09/26/2013 9.0* 12.0 - 15.0 g/dL Final  . HCT 09/26/2013 27.5* 36.0 - 46.0 % Final  . MCV 09/26/2013 103.4* 78.0 - 100.0 fL Final  . MCH 09/26/2013 33.8  26.0 - 34.0 pg Final  . MCHC 09/26/2013 32.7  30.0 - 36.0 g/dL Final  . RDW 09/26/2013 15.0  11.5 - 15.5 % Final  . Platelets 09/26/2013 188  150 - 400 K/uL Final    PATHOLOGY: No new pathology.  Urinalysis    Component Value Date/Time   COLORURINE YELLOW 04/24/2013 1600   APPEARANCEUR HAZY* 04/24/2013 1600   LABSPEC 1.015 04/24/2013 1600   PHURINE 6.5 04/24/2013  1600   GLUCOSEU >1000* 04/24/2013 1600   HGBUR LARGE* 04/24/2013 1600   BILIRUBINUR NEGATIVE 04/24/2013 1600   KETONESUR NEGATIVE 04/24/2013 1600   PROTEINUR TRACE* 04/24/2013 1600   UROBILINOGEN 0.2 04/24/2013 1600   NITRITE NEGATIVE 04/24/2013 1600   LEUKOCYTESUR MODERATE* 04/24/2013 1600    RADIOGRAPHIC STUDIES: No results found.  ASSESSMENT:  #1.Follicular lymphoma, status post 6 cycles of bendamustine and Rituxan, no evidence of disease. To begin maintenance Rituxan on 07/25/2013  #2. Persistent right hydronephrosis, status post stenting with continued urinary symptoms.  #3. Hypertension, controlled.  #4. Diabetes mellitus, type II, non-insulin requiring, controlled.  #5. Iron deficiency anemia, status post Feraheme in May of 2014, stable with hemoglobin of 8.4 on 07/18/2013.  #6. 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 #7. Leukopenia secondary to previous chemotherapy.       PLAN:  #1. Rituxan 500 mg per meter squared intravenously. #2. Aranesp 500 mcg subcutaneously every 3 weeks with one dose given today. Patient had received 500 mcg on 10/10/2013 #3. Followup with dentist and urologist. #4. Office visit with CBC, chem profile, LDH, beta-2 microglobulin in 3 months to receive cycle #3 of maintenance Rituxan.   All questions were answered. The patient knows to call the clinic with any problems, questions or concerns. We can certainly see the patient much sooner if necessary.  I spent 25 minutes counseling the patient face to face. The total time spent in the appointment was 30 minutes.    Farrel Gobble, MD 10/25/2013 9:30 AM

## 2013-10-25 NOTE — Patient Instructions (Addendum)
[facility] Discharge Instructions for Patients Receiving Chemotherapy  Today you received the following chemotherapy agents rituxan.  aranesp given today and will continue every 3 weeks If you develop nausea and vomiting that is not controlled by your nausea medication, call the clinic. If it is after clinic hours your family physician or the after hours number for the clinic or go to the Emergency Department.   BELOW ARE SYMPTOMS THAT SHOULD BE REPORTED IMMEDIATELY:  *FEVER GREATER THAN 101.0 F  *CHILLS WITH OR WITHOUT FEVER  NAUSEA AND VOMITING THAT IS NOT CONTROLLED WITH YOUR NAUSEA MEDICATION  *UNUSUAL SHORTNESS OF BREATH  *UNUSUAL BRUISING OR BLEEDING  TENDERNESS IN MOUTH AND THROAT WITH OR WITHOUT PRESENCE OF ULCERS  *URINARY PROBLEMS  *BOWEL PROBLEMS  UNUSUAL RASH Items with * indicate a potential emergency and should be followed up as soon as possible.

## 2013-10-25 NOTE — Progress Notes (Signed)
Labs drawn today for cbc/diff,ferr 

## 2013-11-14 ENCOUNTER — Encounter (HOSPITAL_COMMUNITY): Payer: Medicare Other | Attending: Hematology and Oncology

## 2013-11-14 ENCOUNTER — Encounter (HOSPITAL_COMMUNITY): Payer: Medicare Other

## 2013-11-14 DIAGNOSIS — Z9889 Other specified postprocedural states: Secondary | ICD-10-CM | POA: Insufficient documentation

## 2013-11-14 DIAGNOSIS — D631 Anemia in chronic kidney disease: Secondary | ICD-10-CM

## 2013-11-14 DIAGNOSIS — N184 Chronic kidney disease, stage 4 (severe): Secondary | ICD-10-CM

## 2013-11-14 DIAGNOSIS — N039 Chronic nephritic syndrome with unspecified morphologic changes: Secondary | ICD-10-CM

## 2013-11-14 LAB — CBC
HCT: 28.4 % — ABNORMAL LOW (ref 36.0–46.0)
Hemoglobin: 9.2 g/dL — ABNORMAL LOW (ref 12.0–15.0)
MCH: 32.3 pg (ref 26.0–34.0)
MCHC: 32.4 g/dL (ref 30.0–36.0)
MCV: 99.6 fL (ref 78.0–100.0)
PLATELETS: 210 10*3/uL (ref 150–400)
RBC: 2.85 MIL/uL — ABNORMAL LOW (ref 3.87–5.11)
RDW: 15.9 % — AB (ref 11.5–15.5)
WBC: 2.7 10*3/uL — AB (ref 4.0–10.5)

## 2013-11-14 MED ORDER — DARBEPOETIN ALFA-POLYSORBATE 500 MCG/ML IJ SOLN
500.0000 ug | Freq: Once | INTRAMUSCULAR | Status: AC
  Administered 2013-11-14: 500 ug via SUBCUTANEOUS

## 2013-11-14 MED ORDER — DARBEPOETIN ALFA-POLYSORBATE 500 MCG/ML IJ SOLN
INTRAMUSCULAR | Status: AC
  Filled 2013-11-14: qty 1

## 2013-11-14 NOTE — Progress Notes (Signed)
[name] [name] presented for labwork. Labs per MD order drawn via Peripheral Line 25 gauge needle inserted in lt ac.  Good blood return present. Procedure without incident.  Needle removed intact. Patient tolerated procedure well.  [name] [name] presents today for injection per MD orders. Aranesp 500 mcg administered SQ in left Abdomen. Administration without incident. Patient tolerated well.

## 2013-12-05 ENCOUNTER — Encounter (HOSPITAL_COMMUNITY): Payer: Medicare Other

## 2013-12-05 ENCOUNTER — Encounter (HOSPITAL_BASED_OUTPATIENT_CLINIC_OR_DEPARTMENT_OTHER): Payer: Medicare Other

## 2013-12-05 VITALS — BP 151/55 | HR 76 | Resp 18

## 2013-12-05 DIAGNOSIS — Z452 Encounter for adjustment and management of vascular access device: Secondary | ICD-10-CM

## 2013-12-05 DIAGNOSIS — N184 Chronic kidney disease, stage 4 (severe): Secondary | ICD-10-CM

## 2013-12-05 DIAGNOSIS — C8299 Follicular lymphoma, unspecified, extranodal and solid organ sites: Secondary | ICD-10-CM

## 2013-12-05 DIAGNOSIS — Z95828 Presence of other vascular implants and grafts: Secondary | ICD-10-CM

## 2013-12-05 DIAGNOSIS — D631 Anemia in chronic kidney disease: Secondary | ICD-10-CM

## 2013-12-05 DIAGNOSIS — N039 Chronic nephritic syndrome with unspecified morphologic changes: Secondary | ICD-10-CM

## 2013-12-05 LAB — CBC
HEMATOCRIT: 27.3 % — AB (ref 36.0–46.0)
HEMOGLOBIN: 8.7 g/dL — AB (ref 12.0–15.0)
MCH: 31.1 pg (ref 26.0–34.0)
MCHC: 31.9 g/dL (ref 30.0–36.0)
MCV: 97.5 fL (ref 78.0–100.0)
Platelets: 209 10*3/uL (ref 150–400)
RBC: 2.8 MIL/uL — AB (ref 3.87–5.11)
RDW: 16 % — ABNORMAL HIGH (ref 11.5–15.5)
WBC: 3.2 10*3/uL — ABNORMAL LOW (ref 4.0–10.5)

## 2013-12-05 MED ORDER — HEPARIN SOD (PORK) LOCK FLUSH 100 UNIT/ML IV SOLN
500.0000 [IU] | Freq: Once | INTRAVENOUS | Status: AC
  Administered 2013-12-05: 500 [IU] via INTRAVENOUS

## 2013-12-05 MED ORDER — HEPARIN SOD (PORK) LOCK FLUSH 100 UNIT/ML IV SOLN
INTRAVENOUS | Status: AC
  Filled 2013-12-05: qty 5

## 2013-12-05 MED ORDER — DARBEPOETIN ALFA-POLYSORBATE 500 MCG/ML IJ SOLN
500.0000 ug | Freq: Once | INTRAMUSCULAR | Status: AC
  Administered 2013-12-05: 500 ug via SUBCUTANEOUS

## 2013-12-05 MED ORDER — DARBEPOETIN ALFA-POLYSORBATE 500 MCG/ML IJ SOLN
INTRAMUSCULAR | Status: AC
Start: 2013-12-05 — End: 2013-12-05
  Filled 2013-12-05: qty 1

## 2013-12-05 MED ORDER — SODIUM CHLORIDE 0.9 % IJ SOLN
10.0000 mL | INTRAMUSCULAR | Status: DC | PRN
  Administered 2013-12-05: 10 mL via INTRAVENOUS

## 2013-12-05 NOTE — Progress Notes (Signed)
[name] [name] presented for Portacath access and flush. Proper placement of portacath confirmed by CXR. Portacath located left chest wall accessed with  H 20 needle. No blood return. Flushes easily without pain/discomfort or resistance. Patient reports sometimes it gives blood and sometimes it doesn't. Portacath flushed with 64ml NS and 500U/15ml Heparin and needle removed intact. Procedure without incident. Patient tolerated procedure well.  [name] [name] presented for labwork. Labs per MD order drawn via Peripheral Line 23 gauge needle inserted in left antecubital.  Good blood return present. Procedure without incident.  Needle removed intact. Patient tolerated procedure well.  [name] [name] presents today for injection per MD orders. Aranesp 500 mcg administered SQ in left Abdomen. Administration without incident. Patient tolerated well.

## 2013-12-07 NOTE — Progress Notes (Signed)
Labs drawn

## 2013-12-25 ENCOUNTER — Other Ambulatory Visit (HOSPITAL_COMMUNITY): Payer: Self-pay

## 2013-12-25 DIAGNOSIS — D649 Anemia, unspecified: Secondary | ICD-10-CM

## 2013-12-25 DIAGNOSIS — N184 Chronic kidney disease, stage 4 (severe): Secondary | ICD-10-CM

## 2013-12-25 DIAGNOSIS — C8290 Follicular lymphoma, unspecified, unspecified site: Secondary | ICD-10-CM

## 2013-12-26 ENCOUNTER — Other Ambulatory Visit (HOSPITAL_COMMUNITY): Payer: Medicare Other

## 2013-12-26 ENCOUNTER — Encounter (HOSPITAL_COMMUNITY): Payer: Medicare Other | Attending: Oncology

## 2013-12-26 ENCOUNTER — Encounter (HOSPITAL_BASED_OUTPATIENT_CLINIC_OR_DEPARTMENT_OTHER): Payer: Medicare Other

## 2013-12-26 VITALS — BP 144/76 | HR 80 | Resp 18

## 2013-12-26 DIAGNOSIS — N184 Chronic kidney disease, stage 4 (severe): Secondary | ICD-10-CM

## 2013-12-26 DIAGNOSIS — D631 Anemia in chronic kidney disease: Secondary | ICD-10-CM

## 2013-12-26 DIAGNOSIS — D649 Anemia, unspecified: Secondary | ICD-10-CM | POA: Insufficient documentation

## 2013-12-26 DIAGNOSIS — C8299 Follicular lymphoma, unspecified, extranodal and solid organ sites: Secondary | ICD-10-CM

## 2013-12-26 DIAGNOSIS — N039 Chronic nephritic syndrome with unspecified morphologic changes: Secondary | ICD-10-CM

## 2013-12-26 DIAGNOSIS — C8290 Follicular lymphoma, unspecified, unspecified site: Secondary | ICD-10-CM

## 2013-12-26 LAB — CBC WITH DIFFERENTIAL/PLATELET
BASOS ABS: 0 10*3/uL (ref 0.0–0.1)
Basophils Relative: 1 % (ref 0–1)
EOS PCT: 4 % (ref 0–5)
Eosinophils Absolute: 0.1 10*3/uL (ref 0.0–0.7)
HEMATOCRIT: 26.8 % — AB (ref 36.0–46.0)
Hemoglobin: 8.5 g/dL — ABNORMAL LOW (ref 12.0–15.0)
LYMPHS ABS: 0.8 10*3/uL (ref 0.7–4.0)
LYMPHS PCT: 21 % (ref 12–46)
MCH: 30.5 pg (ref 26.0–34.0)
MCHC: 31.7 g/dL (ref 30.0–36.0)
MCV: 96.1 fL (ref 78.0–100.0)
MONO ABS: 0.6 10*3/uL (ref 0.1–1.0)
Monocytes Relative: 16 % — ABNORMAL HIGH (ref 3–12)
Neutro Abs: 2.1 10*3/uL (ref 1.7–7.7)
Neutrophils Relative %: 58 % (ref 43–77)
PLATELETS: 266 10*3/uL (ref 150–400)
RBC: 2.79 MIL/uL — ABNORMAL LOW (ref 3.87–5.11)
RDW: 17.1 % — AB (ref 11.5–15.5)
WBC: 3.6 10*3/uL — ABNORMAL LOW (ref 4.0–10.5)

## 2013-12-26 MED ORDER — DARBEPOETIN ALFA-POLYSORBATE 500 MCG/ML IJ SOLN
500.0000 ug | Freq: Once | INTRAMUSCULAR | Status: AC
  Administered 2013-12-26: 500 ug via SUBCUTANEOUS

## 2013-12-26 MED ORDER — DARBEPOETIN ALFA-POLYSORBATE 500 MCG/ML IJ SOLN
INTRAMUSCULAR | Status: AC
  Filled 2013-12-26: qty 1

## 2013-12-26 NOTE — Progress Notes (Signed)
[name] [name] presents today for injection per MD orders.Aranesp 500 mcg administered SQ in right Abdomen. Administration without incident. Patient tolerated well.

## 2013-12-26 NOTE — Progress Notes (Signed)
LABS DRAWN FOR CBC/DIFF.  

## 2014-01-14 ENCOUNTER — Other Ambulatory Visit (HOSPITAL_COMMUNITY): Payer: Self-pay

## 2014-01-16 ENCOUNTER — Encounter (HOSPITAL_COMMUNITY): Payer: Medicare Other

## 2014-01-16 ENCOUNTER — Other Ambulatory Visit (HOSPITAL_COMMUNITY): Payer: Self-pay | Admitting: Hematology and Oncology

## 2014-01-16 ENCOUNTER — Other Ambulatory Visit (HOSPITAL_COMMUNITY): Payer: Medicare Other

## 2014-01-16 ENCOUNTER — Encounter (HOSPITAL_COMMUNITY): Payer: Medicare Other | Attending: Oncology

## 2014-01-16 VITALS — BP 145/57 | HR 62

## 2014-01-16 DIAGNOSIS — N039 Chronic nephritic syndrome with unspecified morphologic changes: Secondary | ICD-10-CM

## 2014-01-16 DIAGNOSIS — R471 Dysarthria and anarthria: Secondary | ICD-10-CM | POA: Insufficient documentation

## 2014-01-16 DIAGNOSIS — C8290 Follicular lymphoma, unspecified, unspecified site: Secondary | ICD-10-CM

## 2014-01-16 DIAGNOSIS — D649 Anemia, unspecified: Secondary | ICD-10-CM

## 2014-01-16 DIAGNOSIS — N184 Chronic kidney disease, stage 4 (severe): Secondary | ICD-10-CM

## 2014-01-16 DIAGNOSIS — C8299 Follicular lymphoma, unspecified, extranodal and solid organ sites: Secondary | ICD-10-CM | POA: Diagnosis present

## 2014-01-16 DIAGNOSIS — D631 Anemia in chronic kidney disease: Secondary | ICD-10-CM

## 2014-01-16 LAB — CBC WITH DIFFERENTIAL/PLATELET
Basophils Absolute: 0 10*3/uL (ref 0.0–0.1)
Basophils Relative: 0 % (ref 0–1)
EOS ABS: 0.2 10*3/uL (ref 0.0–0.7)
EOS PCT: 3 % (ref 0–5)
HEMATOCRIT: 28.5 % — AB (ref 36.0–46.0)
HEMOGLOBIN: 9.1 g/dL — AB (ref 12.0–15.0)
LYMPHS ABS: 0.9 10*3/uL (ref 0.7–4.0)
LYMPHS PCT: 18 % (ref 12–46)
MCH: 30.7 pg (ref 26.0–34.0)
MCHC: 31.9 g/dL (ref 30.0–36.0)
MCV: 96.3 fL (ref 78.0–100.0)
MONOS PCT: 9 % (ref 3–12)
Monocytes Absolute: 0.4 10*3/uL (ref 0.1–1.0)
Neutro Abs: 3.5 10*3/uL (ref 1.7–7.7)
Neutrophils Relative %: 70 % (ref 43–77)
Platelets: 213 10*3/uL (ref 150–400)
RBC: 2.96 MIL/uL — AB (ref 3.87–5.11)
RDW: 18.7 % — ABNORMAL HIGH (ref 11.5–15.5)
WBC: 5.1 10*3/uL (ref 4.0–10.5)

## 2014-01-16 LAB — COMPREHENSIVE METABOLIC PANEL
ALT: 14 U/L (ref 0–35)
ANION GAP: 12 (ref 5–15)
AST: 21 U/L (ref 0–37)
Albumin: 3.1 g/dL — ABNORMAL LOW (ref 3.5–5.2)
Alkaline Phosphatase: 120 U/L — ABNORMAL HIGH (ref 39–117)
BUN: 30 mg/dL — AB (ref 6–23)
CALCIUM: 8.8 mg/dL (ref 8.4–10.5)
CO2: 25 mEq/L (ref 19–32)
Chloride: 100 mEq/L (ref 96–112)
Creatinine, Ser: 2.07 mg/dL — ABNORMAL HIGH (ref 0.50–1.10)
GFR calc non Af Amer: 23 mL/min — ABNORMAL LOW (ref >90)
GFR, EST AFRICAN AMERICAN: 26 mL/min — AB (ref >90)
GLUCOSE: 258 mg/dL — AB (ref 70–99)
Potassium: 5.1 mEq/L (ref 3.7–5.3)
Sodium: 137 mEq/L (ref 137–147)
Total Bilirubin: 0.2 mg/dL — ABNORMAL LOW (ref 0.3–1.2)
Total Protein: 6 g/dL (ref 6.0–8.3)

## 2014-01-16 LAB — LACTATE DEHYDROGENASE: LDH: 237 U/L (ref 94–250)

## 2014-01-16 MED ORDER — DARBEPOETIN ALFA-POLYSORBATE 500 MCG/ML IJ SOLN
INTRAMUSCULAR | Status: AC
  Filled 2014-01-16: qty 1

## 2014-01-16 MED ORDER — DARBEPOETIN ALFA-POLYSORBATE 500 MCG/ML IJ SOLN
500.0000 ug | Freq: Once | INTRAMUSCULAR | Status: AC
  Administered 2014-01-16: 500 ug via SUBCUTANEOUS

## 2014-01-16 MED ORDER — SODIUM CHLORIDE 0.9 % IJ SOLN
10.0000 mL | INTRAMUSCULAR | Status: DC | PRN
  Administered 2014-01-16: 10 mL via INTRAVENOUS

## 2014-01-16 MED ORDER — HEPARIN SOD (PORK) LOCK FLUSH 100 UNIT/ML IV SOLN
500.0000 [IU] | Freq: Once | INTRAVENOUS | Status: AC
  Administered 2014-01-16: 500 [IU] via INTRAVENOUS
  Filled 2014-01-16: qty 5

## 2014-01-16 NOTE — Progress Notes (Signed)
Shiva L Schurman presents today for injection per MD orders. Aranesp 500 mcg administered SQ in left Abdomen. Administration without incident. Patient tolerated well.  

## 2014-01-16 NOTE — Progress Notes (Signed)
[name] presented for Portacath access and flush.  Portacath located left chest wall accessed with  H 20 needle.  Unable to aspirate with blood return.  Flushed carefully with NS 10 ml while assessing site.  Pt denies c/o pain and no sign of infiltration at site. Reports, "they have trouble a lot of times getting blood from my port.  Last time they had to put some medicine in it to make it work.  Reports this has been a frequent problem of late.  Flushed with an additional 10 ml saline and 500U/67ml Heparin and needle removed intact.  Procedure tolerated well and without incident.

## 2014-01-16 NOTE — Progress Notes (Unsigned)
LABS DRAWN FO CBCD, LDH, CMP, B2M.

## 2014-01-18 LAB — BETA 2 MICROGLOBULIN, SERUM: Beta-2 Microglobulin: 8.69 mg/L — ABNORMAL HIGH (ref <=2.51)

## 2014-01-24 ENCOUNTER — Encounter (HOSPITAL_COMMUNITY): Payer: Self-pay

## 2014-01-24 ENCOUNTER — Other Ambulatory Visit (HOSPITAL_COMMUNITY): Payer: Medicare Other

## 2014-01-24 ENCOUNTER — Ambulatory Visit (HOSPITAL_COMMUNITY)
Admission: RE | Admit: 2014-01-24 | Discharge: 2014-01-24 | Disposition: A | Discharge status: Home / Self Care | Payer: Medicare Other | Source: Ambulatory Visit | Attending: Hematology and Oncology | Admitting: Hematology and Oncology

## 2014-01-24 ENCOUNTER — Encounter (HOSPITAL_BASED_OUTPATIENT_CLINIC_OR_DEPARTMENT_OTHER): Payer: Medicare Other

## 2014-01-24 VITALS — BP 154/50 | HR 57 | Temp 98.0°F | Resp 18

## 2014-01-24 VITALS — BP 174/59 | HR 79 | Temp 98.1°F | Resp 18 | Wt 210.6 lb

## 2014-01-24 DIAGNOSIS — Z5112 Encounter for antineoplastic immunotherapy: Secondary | ICD-10-CM

## 2014-01-24 DIAGNOSIS — D631 Anemia in chronic kidney disease: Secondary | ICD-10-CM

## 2014-01-24 DIAGNOSIS — C8290 Follicular lymphoma, unspecified, unspecified site: Secondary | ICD-10-CM

## 2014-01-24 DIAGNOSIS — C8299 Follicular lymphoma, unspecified, extranodal and solid organ sites: Secondary | ICD-10-CM | POA: Diagnosis not present

## 2014-01-24 DIAGNOSIS — R471 Dysarthria and anarthria: Secondary | ICD-10-CM

## 2014-01-24 DIAGNOSIS — R51 Headache: Secondary | ICD-10-CM | POA: Insufficient documentation

## 2014-01-24 DIAGNOSIS — N039 Chronic nephritic syndrome with unspecified morphologic changes: Secondary | ICD-10-CM

## 2014-01-24 DIAGNOSIS — N189 Chronic kidney disease, unspecified: Secondary | ICD-10-CM

## 2014-01-24 DIAGNOSIS — D649 Anemia, unspecified: Secondary | ICD-10-CM

## 2014-01-24 DIAGNOSIS — N184 Chronic kidney disease, stage 4 (severe): Secondary | ICD-10-CM

## 2014-01-24 LAB — COMPREHENSIVE METABOLIC PANEL
ALK PHOS: 118 U/L — AB (ref 39–117)
ALT: 14 U/L (ref 0–35)
ANION GAP: 10 (ref 5–15)
AST: 22 U/L (ref 0–37)
Albumin: 3.3 g/dL — ABNORMAL LOW (ref 3.5–5.2)
BILIRUBIN TOTAL: 0.2 mg/dL — AB (ref 0.3–1.2)
BUN: 26 mg/dL — AB (ref 6–23)
CHLORIDE: 101 meq/L (ref 96–112)
CO2: 26 meq/L (ref 19–32)
CREATININE: 2.11 mg/dL — AB (ref 0.50–1.10)
Calcium: 9.7 mg/dL (ref 8.4–10.5)
GFR, EST AFRICAN AMERICAN: 26 mL/min — AB (ref >90)
GFR, EST NON AFRICAN AMERICAN: 22 mL/min — AB (ref >90)
GLUCOSE: 238 mg/dL — AB (ref 70–99)
POTASSIUM: 5.1 meq/L (ref 3.7–5.3)
Sodium: 137 mEq/L (ref 137–147)
Total Protein: 6.2 g/dL (ref 6.0–8.3)

## 2014-01-24 LAB — CBC WITH DIFFERENTIAL/PLATELET
Basophils Absolute: 0 10*3/uL (ref 0.0–0.1)
Basophils Relative: 0 % (ref 0–1)
EOS PCT: 4 % (ref 0–5)
Eosinophils Absolute: 0.2 10*3/uL (ref 0.0–0.7)
HEMATOCRIT: 29.1 % — AB (ref 36.0–46.0)
Hemoglobin: 9.1 g/dL — ABNORMAL LOW (ref 12.0–15.0)
LYMPHS ABS: 0.8 10*3/uL (ref 0.7–4.0)
Lymphocytes Relative: 19 % (ref 12–46)
MCH: 30.4 pg (ref 26.0–34.0)
MCHC: 31.3 g/dL (ref 30.0–36.0)
MCV: 97.3 fL (ref 78.0–100.0)
MONO ABS: 0.4 10*3/uL (ref 0.1–1.0)
Monocytes Relative: 10 % (ref 3–12)
NEUTROS ABS: 2.8 10*3/uL (ref 1.7–7.7)
Neutrophils Relative %: 67 % (ref 43–77)
Platelets: 192 10*3/uL (ref 150–400)
RBC: 2.99 MIL/uL — ABNORMAL LOW (ref 3.87–5.11)
RDW: 19 % — ABNORMAL HIGH (ref 11.5–15.5)
WBC: 4.1 10*3/uL (ref 4.0–10.5)

## 2014-01-24 LAB — LACTATE DEHYDROGENASE: LDH: 265 U/L — ABNORMAL HIGH (ref 94–250)

## 2014-01-24 LAB — FERRITIN: Ferritin: 500 ng/mL — ABNORMAL HIGH (ref 10–291)

## 2014-01-24 MED ORDER — SODIUM CHLORIDE 0.9 % IV SOLN
Freq: Once | INTRAVENOUS | Status: AC
  Administered 2014-01-24: 11:00:00 via INTRAVENOUS

## 2014-01-24 MED ORDER — HEPARIN SOD (PORK) LOCK FLUSH 100 UNIT/ML IV SOLN
500.0000 [IU] | Freq: Once | INTRAVENOUS | Status: AC | PRN
  Administered 2014-01-24: 500 [IU]
  Filled 2014-01-24: qty 5

## 2014-01-24 MED ORDER — ASPIRIN 81 MG PO CHEW
81.0000 mg | CHEWABLE_TABLET | Freq: Once | ORAL | Status: AC
  Administered 2014-01-24: 81 mg via ORAL
  Filled 2014-01-24: qty 1

## 2014-01-24 MED ORDER — SODIUM CHLORIDE 0.9 % IV SOLN
500.0000 mg/m2 | Freq: Once | INTRAVENOUS | Status: AC
  Administered 2014-01-24: 1100 mg via INTRAVENOUS
  Filled 2014-01-24: qty 110

## 2014-01-24 MED ORDER — SODIUM CHLORIDE 0.9 % IJ SOLN
10.0000 mL | INTRAMUSCULAR | Status: DC | PRN
  Administered 2014-01-24: 10 mL

## 2014-01-24 NOTE — Progress Notes (Signed)
1506 tolerated infusion well. To xray for ct scan.

## 2014-01-24 NOTE — Patient Instructions (Signed)
[facility] Discharge Instructions  RECOMMENDATIONS MADE BY THE CONSULTANT AND ANY TEST RESULTS WILL BE SENT TO YOUR REFERRING PHYSICIAN.  EXAM FINDINGS BY THE PHYSICIAN TODAY AND SIGNS OR SYMPTOMS TO REPORT TO CLINIC OR PRIMARY PHYSICIAN: You saw Dr Barnet Glasgow today  You will go to radiology after you receive chemotherapy to have a CT of your head.  81 mg aspirin daily. Followup in 3 months with continuation of treatment   Thank you for choosing [facility] to provide your oncology and hematology care.  To afford each patient quality time with our providers, please arrive at least 15 minutes before your scheduled appointment time.  With your help, our goal is to use those 15 minutes to complete the necessary work-up to ensure our physicians have the information they need to help with your evaluation and healthcare recommendations.    Effective January 1st, 2014, we ask that you re-schedule your appointment with our physicians should you arrive 10 or more minutes late for your appointment.  We strive to give you quality time with our providers, and arriving late affects you and other patients whose appointments are after yours.    Again, thank you for choosing [facility].  Our hope is that these requests will decrease the amount of time that you wait before being seen by our physicians.       _____________________________________________________________  Should you have questions after your visit to [facility], please contact our office at [phone] between the hours of 8:30 a.m. and 5:00 p.m.  Voicemails left after 4:30 p.m. will not be returned until the following business day.  For prescription refill requests, have your pharmacy contact our office with your prescription refill request.

## 2014-01-24 NOTE — Progress Notes (Signed)
Patient took Tylenol 650mg  and Benadryl 50mg  po at home.

## 2014-01-24 NOTE — Progress Notes (Signed)
Returned from [facility]. Port flush and d/c then home with husband

## 2014-01-24 NOTE — Progress Notes (Signed)
LABS DRAWN FOR CBCD,FERR. AND ADDED LDH,B2M,CMP

## 2014-01-24 NOTE — Progress Notes (Signed)
[facility]  OFFICE PROGRESS NOTE  [name], MD [address] [place] [address]  DIAGNOSIS: Follicular lymphoma - Plan: Comprehensive metabolic panel, Lactate dehydrogenase, Beta 2 microglobulin, serum, Ferritin, CBC with Differential, Ferritin  Chronic kidney disease (CKD), stage IV (severe) - Plan: Comprehensive metabolic panel, Ferritin, CBC with Differential, Ferritin  Anemia in chronic kidney disease  Anemia - Plan: CBC with Differential  Dysarthria - Plan: CT Head W Wo Contrast  Chief Complaint  Patient presents with  . Follicular lymphoma  . Anemia in chronic renal disease    CURRENT THERAPY: Rituxan maintenance cycle #3 today having completed 6 cycles of bendamustine/Rituxan in October 2014 with a first maintenance treatment started on 07/25/2013. Aranesp every 3 weeks for anemia in kidney failure with last dose on 01/16/2014.  INTERVAL HISTORY: [name] [name] 73 y.o. female returns for continuation of maintenance Rituxan after definitive treatment for follicular lymphoma in the setting of chronic renal failure with anemia treated with Aranesp, last dose on 01/16/2014. Over the past week she has had difficulty in verbal expression in addition to dysarthria. She's had minimal headache with no nausea, vomiting, or focal weakness. She denies any fever, night sweats, lower extremity swelling or redness, melena, hematochezia, hematuria, incontinence, skin rash, or seizures. She did receive Aranesp on 01/16/2014.  MEDICAL HISTORY: Past Medical History  Diagnosis Date  . Hypertension   . Diabetes mellitus, type 2   . Hyperlipidemia   . Iron deficiency anemia 11/21/2012    At time of presentation.  Feraheme 1020 mg on 11/21/12.  . Follicular lymphoma 01/05/9484    Right ureteral obstruction by lymphadenopathy-> right hydronephrosis  . Port catheter in place 06/06/2013    INTERIM HISTORY: has Follicular lymphoma; Iron  deficiency anemia; Chronic kidney disease (CKD), stage IV (severe); Diabetes mellitus type 2 in obese; Hypertension; UTI (urinary tract infection); Leukocytosis, unspecified; Hypoglycemia; and Port catheter in place on her problem list.    ALLERGIES:  has No Known Allergies.  MEDICATIONS: has a current medication list which includes the following prescription(s): acetaminophen, atorvastatin, dexamethasone, febuxostat, fluconazole, furosemide, insulin detemir, lidocaine-prilocaine, metoclopramide, multivitamin with minerals, and ondansetron, and the following Facility-Administered Medications: sodium chloride, aspirin, heparin lock flush, riTUXimab (RITUXAN) 1,100 mg in sodium chloride 0.9 % 250 mL chemo infusion, and sodium chloride.  SURGICAL HISTORY:  Past Surgical History  Procedure Laterality Date  . Cataract extraction    . Cystoscopy w/ ureteral stent placement Right 11/03/2012    Procedure: CYSTOSCOPY WITH RETROGRADE PYELOGRAM/URETERAL STENT PLACEMENT;  Surgeon: Malka So, MD;  Location: [facility];  Service: Urology;  Laterality: Right;  . Portacath placement Left 11/03/2012    Procedure: INSERTION PORT-A-CATH;  Surgeon: Scherry Ran, MD;  Location: [facility];  Service: General;  Laterality: Left;  Insertion Port-A-Cath Left Subclavian  . Appendectomy    . Cystoscopy w/ ureteral stent placement Right 03/23/2013    Procedure: CYSTOSCOPY WITH STENT REPLACEMENT;  Surgeon: Malka So, MD;  Location: [facility];  Service: Urology;  Laterality: Right;    FAMILY HISTORY: family history includes Cancer in her brother and father.  SOCIAL HISTORY:  reports that she has never smoked. She has never used smokeless tobacco. She reports that she does not drink alcohol or use illicit drugs.  REVIEW OF SYSTEMS:  Other than that discussed above is noncontributory.  PHYSICAL EXAMINATION: ECOG PERFORMANCE STATUS: 1 - Symptomatic but completely ambulatory  Blood pressure 174/59, pulse 79, temperature  98.1 F (36.7 C), temperature source Oral, resp. rate 18, weight 210 lb 9.6 oz (95.528 kg).  GENERAL:alert, no distress and comfortable SKIN: skin color, texture, turgor are normal, no rashes or significant lesions EYES: PERLA; Conjunctiva are pink and non-injected, sclera clear SINUSES: No redness or tenderness over maxillary or ethmoid sinuses OROPHARYNX:no exudate, no erythema on lips, buccal mucosa, or tongue. NECK: supple, thyroid normal size, non-tender, without nodularity. No masses CHEST: Left sided LifePort in place. No breast masses. LYMPH:  no palpable lymphadenopathy in the cervical, axillary or inguinal LUNGS: clear to auscultation and percussion with normal breathing effort HEART: regular rate & rhythm and no murmurs. ABDOMEN:abdomen soft, non-tender and normal bowel sounds MUSCULOSKELETAL:no cyanosis of digits and no clubbing. Range of motion normal.  NEURO: alert & oriented x 3 with fluent speech, no focal motor/sensory deficits   LABORATORY DATA: Office Visit on 01/24/2014  Component Date Value Ref Range Status  . WBC 01/24/2014 4.1  4.0 - 10.5 K/uL Final  . RBC 01/24/2014 2.99* 3.87 - 5.11 MIL/uL Final  . Hemoglobin 01/24/2014 9.1* 12.0 - 15.0 g/dL Final  . HCT 01/24/2014 29.1* 36.0 - 46.0 % Final  . MCV 01/24/2014 97.3  78.0 - 100.0 fL Final  . MCH 01/24/2014 30.4  26.0 - 34.0 pg Final  . MCHC 01/24/2014 31.3  30.0 - 36.0 g/dL Final  . RDW 01/24/2014 19.0* 11.5 - 15.5 % Final  . Platelets 01/24/2014 192  150 - 400 K/uL Final  . Neutrophils Relative % 01/24/2014 67  43 - 77 % Final  . Neutro Abs 01/24/2014 2.8  1.7 - 7.7 K/uL Final  . Lymphocytes Relative 01/24/2014 19  12 - 46 % Final  . Lymphs Abs 01/24/2014 0.8  0.7 - 4.0 K/uL Final  . Monocytes Relative 01/24/2014 10  3 - 12 % Final  . Monocytes Absolute 01/24/2014 0.4  0.1 - 1.0 K/uL Final  . Eosinophils Relative 01/24/2014 4  0 - 5 % Final  . Eosinophils Absolute 01/24/2014 0.2  0.0 - 0.7 K/uL Final  .  Basophils Relative 01/24/2014 0  0 - 1 % Final  . Basophils Absolute 01/24/2014 0.0  0.0 - 0.1 K/uL Final  Infusion on 01/16/2014  Component Date Value Ref Range Status  . Sodium 01/16/2014 137  137 - 147 mEq/L Final  . Potassium 01/16/2014 5.1  3.7 - 5.3 mEq/L Final  . Chloride 01/16/2014 100  96 - 112 mEq/L Final  . CO2 01/16/2014 25  19 - 32 mEq/L Final  . Glucose, Bld 01/16/2014 258* 70 - 99 mg/dL Final  . BUN 01/16/2014 30* 6 - 23 mg/dL Final  . Creatinine, Ser 01/16/2014 2.07* 0.50 - 1.10 mg/dL Final  . Calcium 01/16/2014 8.8  8.4 - 10.5 mg/dL Final  . Total Protein 01/16/2014 6.0  6.0 - 8.3 g/dL Final  . Albumin 01/16/2014 3.1* 3.5 - 5.2 g/dL Final  . AST 01/16/2014 21  0 - 37 U/L Final  . ALT 01/16/2014 14  0 - 35 U/L Final  . Alkaline Phosphatase 01/16/2014 120* 39 - 117 U/L Final  . Total Bilirubin 01/16/2014 0.2* 0.3 - 1.2 mg/dL Final  . GFR calc non Af Amer 01/16/2014 23* >90 mL/min Final  . GFR calc Af Amer 01/16/2014 26* >90 mL/min Final   Comment: (NOTE)                          The eGFR has been calculated using the CKD  EPI equation.                          This calculation has not been validated in all clinical situations.                          eGFR's persistently <90 mL/min signify possible Chronic Kidney                          Disease.  . Anion gap 01/16/2014 12  5 - 15 Final  . LDH 01/16/2014 237  94 - 250 U/L Final  . Beta-2 Microglobulin 01/16/2014 8.69* <=2.51 mg/L Final   Performed at [facility] on 01/16/2014  Component Date Value Ref Range Status  . WBC 01/16/2014 5.1  4.0 - 10.5 K/uL Final  . RBC 01/16/2014 2.96* 3.87 - 5.11 MIL/uL Final  . Hemoglobin 01/16/2014 9.1* 12.0 - 15.0 g/dL Final  . HCT 01/16/2014 28.5* 36.0 - 46.0 % Final  . MCV 01/16/2014 96.3  78.0 - 100.0 fL Final  . MCH 01/16/2014 30.7  26.0 - 34.0 pg Final  . MCHC 01/16/2014 31.9  30.0 - 36.0 g/dL Final  . RDW 01/16/2014 18.7* 11.5 - 15.5 % Final  . Platelets  01/16/2014 213  150 - 400 K/uL Final  . Neutrophils Relative % 01/16/2014 70  43 - 77 % Final  . Neutro Abs 01/16/2014 3.5  1.7 - 7.7 K/uL Final  . Lymphocytes Relative 01/16/2014 18  12 - 46 % Final  . Lymphs Abs 01/16/2014 0.9  0.7 - 4.0 K/uL Final  . Monocytes Relative 01/16/2014 9  3 - 12 % Final  . Monocytes Absolute 01/16/2014 0.4  0.1 - 1.0 K/uL Final  . Eosinophils Relative 01/16/2014 3  0 - 5 % Final  . Eosinophils Absolute 01/16/2014 0.2  0.0 - 0.7 K/uL Final  . Basophils Relative 01/16/2014 0  0 - 1 % Final  . Basophils Absolute 01/16/2014 0.0  0.0 - 0.1 K/uL Final  Lab on 12/26/2013  Component Date Value Ref Range Status  . WBC 12/26/2013 3.6* 4.0 - 10.5 K/uL Final  . RBC 12/26/2013 2.79* 3.87 - 5.11 MIL/uL Final  . Hemoglobin 12/26/2013 8.5* 12.0 - 15.0 g/dL Final  . HCT 12/26/2013 26.8* 36.0 - 46.0 % Final  . MCV 12/26/2013 96.1  78.0 - 100.0 fL Final  . MCH 12/26/2013 30.5  26.0 - 34.0 pg Final  . MCHC 12/26/2013 31.7  30.0 - 36.0 g/dL Final  . RDW 12/26/2013 17.1* 11.5 - 15.5 % Final  . Platelets 12/26/2013 266  150 - 400 K/uL Final  . Neutrophils Relative % 12/26/2013 58  43 - 77 % Final  . Neutro Abs 12/26/2013 2.1  1.7 - 7.7 K/uL Final  . Lymphocytes Relative 12/26/2013 21  12 - 46 % Final  . Lymphs Abs 12/26/2013 0.8  0.7 - 4.0 K/uL Final  . Monocytes Relative 12/26/2013 16* 3 - 12 % Final  . Monocytes Absolute 12/26/2013 0.6  0.1 - 1.0 K/uL Final  . Eosinophils Relative 12/26/2013 4  0 - 5 % Final  . Eosinophils Absolute 12/26/2013 0.1  0.0 - 0.7 K/uL Final  . Basophils Relative 12/26/2013 1  0 - 1 % Final  . Basophils Absolute 12/26/2013 0.0  0.0 - 0.1 K/uL Final    PATHOLOGY: No new pathology.  Urinalysis    Component  Value Date/Time   COLORURINE YELLOW 04/24/2013 1600   APPEARANCEUR HAZY* 04/24/2013 1600   LABSPEC 1.015 04/24/2013 1600   PHURINE 6.5 04/24/2013 1600   GLUCOSEU >1000* 04/24/2013 1600   HGBUR LARGE* 04/24/2013 1600   BILIRUBINUR  NEGATIVE 04/24/2013 1600   KETONESUR NEGATIVE 04/24/2013 1600   PROTEINUR TRACE* 04/24/2013 1600   UROBILINOGEN 0.2 04/24/2013 1600   NITRITE NEGATIVE 04/24/2013 1600   LEUKOCYTESUR MODERATE* 04/24/2013 1600    RADIOGRAPHIC STUDIES: No results found.  ASSESSMENT:  #1.Follicular lymphoma, status post 6 cycles of bendamustine and Rituxan, no evidence of disease. To begin maintenance Rituxan on 07/25/2013  #2. Persistent right hydronephrosis, status post stenting with continued urinary symptoms.  #3. Hypertension, controlled.  #4. Diabetes mellitus, type II, non-insulin requiring, controlled.  #5. Iron deficiency anemia, status post Feraheme in May of 2014, stable with hemoglobin of 9.1on 01/16/2014.  #6. Anemia secondary to chronic renal failure, Aranesp every 3 weeks with last treatment on 01/16/2014 #7. Leukopenia secondary to previous chemotherapy, resolved. #8. New onset dysarthria and problem with verbal expression, rule out stroke.    PLAN:  #1. CT scan head with and without contrast #2. IV Rituxan. #3. 81 mg aspirin daily. #4. Followup in 3 months with continuation of treatment.    All questions were answered. The patient knows to call the clinic with any problems, questions or concerns. We can certainly see the patient much sooner if necessary.   I spent 25 minutes counseling the patient face to face. The total time spent in the appointment was 30 minutes.    Doroteo Bradford, MD 01/24/2014 10:03 AM  DISCLAIMER:  This note was dictated with voice recognition software.  Similar sounding words can inadvertently be transcribed inaccurately and may not be corrected upon review.

## 2014-01-28 LAB — BETA 2 MICROGLOBULIN, SERUM: Beta-2 Microglobulin: 8.5 mg/L — ABNORMAL HIGH (ref <=2.51)

## 2014-02-06 ENCOUNTER — Encounter (HOSPITAL_COMMUNITY): Payer: Medicare Other

## 2014-02-06 ENCOUNTER — Other Ambulatory Visit (HOSPITAL_COMMUNITY): Payer: Self-pay | Admitting: Oncology

## 2014-02-06 ENCOUNTER — Other Ambulatory Visit (HOSPITAL_COMMUNITY): Payer: Self-pay | Admitting: Hematology and Oncology

## 2014-02-06 ENCOUNTER — Encounter (HOSPITAL_BASED_OUTPATIENT_CLINIC_OR_DEPARTMENT_OTHER): Payer: Medicare Other

## 2014-02-06 DIAGNOSIS — C8290 Follicular lymphoma, unspecified, unspecified site: Secondary | ICD-10-CM

## 2014-02-06 DIAGNOSIS — N039 Chronic nephritic syndrome with unspecified morphologic changes: Secondary | ICD-10-CM

## 2014-02-06 DIAGNOSIS — D649 Anemia, unspecified: Secondary | ICD-10-CM

## 2014-02-06 DIAGNOSIS — C8299 Follicular lymphoma, unspecified, extranodal and solid organ sites: Secondary | ICD-10-CM | POA: Diagnosis not present

## 2014-02-06 DIAGNOSIS — N184 Chronic kidney disease, stage 4 (severe): Secondary | ICD-10-CM

## 2014-02-06 DIAGNOSIS — D631 Anemia in chronic kidney disease: Secondary | ICD-10-CM

## 2014-02-06 LAB — CBC WITH DIFFERENTIAL/PLATELET
BASOS PCT: 1 % (ref 0–1)
Basophils Absolute: 0 10*3/uL (ref 0.0–0.1)
Eosinophils Absolute: 0.2 10*3/uL (ref 0.0–0.7)
Eosinophils Relative: 5 % (ref 0–5)
HCT: 30.9 % — ABNORMAL LOW (ref 36.0–46.0)
Hemoglobin: 9.8 g/dL — ABNORMAL LOW (ref 12.0–15.0)
Lymphocytes Relative: 21 % (ref 12–46)
Lymphs Abs: 1 10*3/uL (ref 0.7–4.0)
MCH: 30.5 pg (ref 26.0–34.0)
MCHC: 31.7 g/dL (ref 30.0–36.0)
MCV: 96.3 fL (ref 78.0–100.0)
Monocytes Absolute: 0.5 10*3/uL (ref 0.1–1.0)
Monocytes Relative: 11 % (ref 3–12)
NEUTROS ABS: 2.8 10*3/uL (ref 1.7–7.7)
Neutrophils Relative %: 62 % (ref 43–77)
Platelets: 250 10*3/uL (ref 150–400)
RBC: 3.21 MIL/uL — ABNORMAL LOW (ref 3.87–5.11)
RDW: 18 % — ABNORMAL HIGH (ref 11.5–15.5)
WBC: 4.4 10*3/uL (ref 4.0–10.5)

## 2014-02-06 MED ORDER — DARBEPOETIN ALFA-POLYSORBATE 500 MCG/ML IJ SOLN
500.0000 ug | Freq: Once | INTRAMUSCULAR | Status: AC
  Administered 2014-02-06: 500 ug via SUBCUTANEOUS

## 2014-02-06 MED ORDER — DARBEPOETIN ALFA-POLYSORBATE 500 MCG/ML IJ SOLN
INTRAMUSCULAR | Status: AC
  Filled 2014-02-06: qty 1

## 2014-02-06 NOTE — Progress Notes (Signed)
Labs drawn for cbcd 

## 2014-02-06 NOTE — Progress Notes (Signed)
[name] [name] presents today for injection per MD orders. Aranesp 500 administered SQ in left Abdomen. Administration without incident. Patient tolerated well.

## 2014-02-27 ENCOUNTER — Encounter (HOSPITAL_BASED_OUTPATIENT_CLINIC_OR_DEPARTMENT_OTHER): Payer: Medicare Other

## 2014-02-27 ENCOUNTER — Encounter (HOSPITAL_COMMUNITY): Payer: Medicare Other | Attending: Oncology

## 2014-02-27 VITALS — BP 146/48 | HR 55 | Temp 98.0°F | Resp 18 | Wt 212.6 lb

## 2014-02-27 DIAGNOSIS — N184 Chronic kidney disease, stage 4 (severe): Secondary | ICD-10-CM

## 2014-02-27 DIAGNOSIS — N039 Chronic nephritic syndrome with unspecified morphologic changes: Secondary | ICD-10-CM

## 2014-02-27 DIAGNOSIS — C8290 Follicular lymphoma, unspecified, unspecified site: Secondary | ICD-10-CM

## 2014-02-27 DIAGNOSIS — Z95828 Presence of other vascular implants and grafts: Secondary | ICD-10-CM

## 2014-02-27 DIAGNOSIS — C8299 Follicular lymphoma, unspecified, extranodal and solid organ sites: Secondary | ICD-10-CM

## 2014-02-27 DIAGNOSIS — D649 Anemia, unspecified: Secondary | ICD-10-CM | POA: Diagnosis present

## 2014-02-27 DIAGNOSIS — Z452 Encounter for adjustment and management of vascular access device: Secondary | ICD-10-CM

## 2014-02-27 DIAGNOSIS — Z9889 Other specified postprocedural states: Secondary | ICD-10-CM | POA: Diagnosis present

## 2014-02-27 DIAGNOSIS — D631 Anemia in chronic kidney disease: Secondary | ICD-10-CM

## 2014-02-27 LAB — CBC WITH DIFFERENTIAL/PLATELET
BASOS PCT: 1 % (ref 0–1)
Basophils Absolute: 0 10*3/uL (ref 0.0–0.1)
EOS ABS: 0.2 10*3/uL (ref 0.0–0.7)
Eosinophils Relative: 4 % (ref 0–5)
HEMATOCRIT: 30.7 % — AB (ref 36.0–46.0)
HEMOGLOBIN: 9.8 g/dL — AB (ref 12.0–15.0)
Lymphocytes Relative: 19 % (ref 12–46)
Lymphs Abs: 0.8 10*3/uL (ref 0.7–4.0)
MCH: 30.3 pg (ref 26.0–34.0)
MCHC: 31.9 g/dL (ref 30.0–36.0)
MCV: 95 fL (ref 78.0–100.0)
MONO ABS: 0.5 10*3/uL (ref 0.1–1.0)
MONOS PCT: 11 % (ref 3–12)
NEUTROS PCT: 65 % (ref 43–77)
Neutro Abs: 2.6 10*3/uL (ref 1.7–7.7)
Platelets: 229 10*3/uL (ref 150–400)
RBC: 3.23 MIL/uL — ABNORMAL LOW (ref 3.87–5.11)
RDW: 17.4 % — ABNORMAL HIGH (ref 11.5–15.5)
WBC: 4.1 10*3/uL (ref 4.0–10.5)

## 2014-02-27 MED ORDER — HEPARIN SOD (PORK) LOCK FLUSH 100 UNIT/ML IV SOLN
500.0000 [IU] | Freq: Once | INTRAVENOUS | Status: AC
  Administered 2014-02-27: 500 [IU] via INTRAVENOUS

## 2014-02-27 MED ORDER — DARBEPOETIN ALFA-POLYSORBATE 500 MCG/ML IJ SOLN
500.0000 ug | Freq: Once | INTRAMUSCULAR | Status: AC
  Administered 2014-02-27: 500 ug via SUBCUTANEOUS

## 2014-02-27 MED ORDER — SODIUM CHLORIDE 0.9 % IJ SOLN
10.0000 mL | Freq: Once | INTRAMUSCULAR | Status: AC
  Administered 2014-02-27: 10 mL via INTRAVENOUS

## 2014-02-27 MED ORDER — DARBEPOETIN ALFA-POLYSORBATE 500 MCG/ML IJ SOLN
INTRAMUSCULAR | Status: AC
  Filled 2014-02-27: qty 1

## 2014-02-27 MED ORDER — ALTEPLASE 2 MG IJ SOLR
2.0000 mg | Freq: Once | INTRAMUSCULAR | Status: AC
  Administered 2014-02-27: 2 mg

## 2014-02-27 MED ORDER — HEPARIN SOD (PORK) LOCK FLUSH 100 UNIT/ML IV SOLN
INTRAVENOUS | Status: AC
  Filled 2014-02-27: qty 5

## 2014-02-27 MED ORDER — STERILE WATER FOR INJECTION IJ SOLN
INTRAMUSCULAR | Status: AC
  Filled 2014-02-27: qty 10

## 2014-02-27 MED ORDER — ALTEPLASE 2 MG IJ SOLR
INTRAMUSCULAR | Status: AC
  Filled 2014-02-27: qty 2

## 2014-02-27 NOTE — Progress Notes (Signed)
1250 Accessed port per protocol. No blood return. Patient agreed to alteplase. Alteplase 2 ml instilled in port. [name] presented for labwork. Labs per MD order drawn via Peripheral Line 23 gauge needle inserted in left antecubital.  Good blood return present. Procedure without incident.  Needle removed intact. Patient tolerated procedure well.   [name] presents today for injection per MD orders. Aranesp 500 mcg administered SQ in left Abdomen. Administration without incident. Patient tolerated well. 1350 no blood return after 1 hour of Alteplase. Patient did not want to wait any longer. Withdrew 2 ml clear colored fluid from port then nothing but air. Flushed port per protocol with 20 ml NS and Heparin 500 units. Patient tolerated all well.

## 2014-03-07 ENCOUNTER — Other Ambulatory Visit (HOSPITAL_COMMUNITY): Payer: Medicare Other

## 2014-03-20 ENCOUNTER — Encounter (HOSPITAL_COMMUNITY): Payer: Medicare Other | Attending: Oncology

## 2014-03-20 ENCOUNTER — Encounter (HOSPITAL_BASED_OUTPATIENT_CLINIC_OR_DEPARTMENT_OTHER): Payer: Medicare Other

## 2014-03-20 VITALS — BP 147/56 | HR 55 | Temp 98.2°F | Resp 18

## 2014-03-20 DIAGNOSIS — C8299 Follicular lymphoma, unspecified, extranodal and solid organ sites: Secondary | ICD-10-CM | POA: Insufficient documentation

## 2014-03-20 DIAGNOSIS — Z9889 Other specified postprocedural states: Secondary | ICD-10-CM | POA: Insufficient documentation

## 2014-03-20 DIAGNOSIS — N184 Chronic kidney disease, stage 4 (severe): Secondary | ICD-10-CM

## 2014-03-20 DIAGNOSIS — D649 Anemia, unspecified: Secondary | ICD-10-CM

## 2014-03-20 DIAGNOSIS — C8290 Follicular lymphoma, unspecified, unspecified site: Secondary | ICD-10-CM

## 2014-03-20 DIAGNOSIS — D631 Anemia in chronic kidney disease: Secondary | ICD-10-CM

## 2014-03-20 DIAGNOSIS — N039 Chronic nephritic syndrome with unspecified morphologic changes: Secondary | ICD-10-CM

## 2014-03-20 LAB — CBC WITH DIFFERENTIAL/PLATELET
BASOS PCT: 1 % (ref 0–1)
Basophils Absolute: 0 10*3/uL (ref 0.0–0.1)
EOS ABS: 0.3 10*3/uL (ref 0.0–0.7)
Eosinophils Relative: 7 % — ABNORMAL HIGH (ref 0–5)
HCT: 30.5 % — ABNORMAL LOW (ref 36.0–46.0)
Hemoglobin: 9.7 g/dL — ABNORMAL LOW (ref 12.0–15.0)
Lymphocytes Relative: 17 % (ref 12–46)
Lymphs Abs: 0.6 10*3/uL — ABNORMAL LOW (ref 0.7–4.0)
MCH: 30 pg (ref 26.0–34.0)
MCHC: 31.8 g/dL (ref 30.0–36.0)
MCV: 94.4 fL (ref 78.0–100.0)
Monocytes Absolute: 0.4 10*3/uL (ref 0.1–1.0)
Monocytes Relative: 11 % (ref 3–12)
NEUTROS PCT: 64 % (ref 43–77)
Neutro Abs: 2.4 10*3/uL (ref 1.7–7.7)
PLATELETS: 242 10*3/uL (ref 150–400)
RBC: 3.23 MIL/uL — ABNORMAL LOW (ref 3.87–5.11)
RDW: 16.8 % — ABNORMAL HIGH (ref 11.5–15.5)
WBC: 3.7 10*3/uL — ABNORMAL LOW (ref 4.0–10.5)

## 2014-03-20 MED ORDER — DARBEPOETIN ALFA-POLYSORBATE 500 MCG/ML IJ SOLN
INTRAMUSCULAR | Status: AC
  Filled 2014-03-20: qty 1

## 2014-03-20 MED ORDER — DARBEPOETIN ALFA-POLYSORBATE 500 MCG/ML IJ SOLN
500.0000 ug | Freq: Once | INTRAMUSCULAR | Status: AC
  Administered 2014-03-20: 500 ug via SUBCUTANEOUS

## 2014-03-20 NOTE — Progress Notes (Signed)
[name] [name]'s reason for visit today is for an injection and labs as scheduled per MD orders.  Labs were drawn prior to administration of ordered medication.   [name] also receive per MD orders; see  [facility] for administration details.  [name] tolerated all procedures well and without incident; questions were answered and patient was discharged.

## 2014-03-20 NOTE — Progress Notes (Signed)
LABS FOR CBCD 

## 2014-04-10 ENCOUNTER — Encounter (HOSPITAL_BASED_OUTPATIENT_CLINIC_OR_DEPARTMENT_OTHER): Payer: Medicare Other

## 2014-04-10 ENCOUNTER — Encounter (HOSPITAL_COMMUNITY): Payer: Medicare Other | Attending: Hematology and Oncology

## 2014-04-10 VITALS — BP 170/59 | HR 62 | Temp 98.1°F | Resp 18

## 2014-04-10 DIAGNOSIS — D631 Anemia in chronic kidney disease: Secondary | ICD-10-CM

## 2014-04-10 DIAGNOSIS — N184 Chronic kidney disease, stage 4 (severe): Secondary | ICD-10-CM | POA: Diagnosis present

## 2014-04-10 DIAGNOSIS — C8299 Follicular lymphoma, unspecified, extranodal and solid organ sites: Secondary | ICD-10-CM

## 2014-04-10 DIAGNOSIS — N039 Chronic nephritic syndrome with unspecified morphologic changes: Secondary | ICD-10-CM

## 2014-04-10 DIAGNOSIS — Z95828 Presence of other vascular implants and grafts: Secondary | ICD-10-CM

## 2014-04-10 DIAGNOSIS — C8290 Follicular lymphoma, unspecified, unspecified site: Secondary | ICD-10-CM

## 2014-04-10 LAB — FERRITIN: Ferritin: 358 ng/mL — ABNORMAL HIGH (ref 10–291)

## 2014-04-10 LAB — CBC
HCT: 33.7 % — ABNORMAL LOW (ref 36.0–46.0)
Hemoglobin: 10.5 g/dL — ABNORMAL LOW (ref 12.0–15.0)
MCH: 29.5 pg (ref 26.0–34.0)
MCHC: 31.2 g/dL (ref 30.0–36.0)
MCV: 94.7 fL (ref 78.0–100.0)
PLATELETS: 256 10*3/uL (ref 150–400)
RBC: 3.56 MIL/uL — AB (ref 3.87–5.11)
RDW: 16.6 % — ABNORMAL HIGH (ref 11.5–15.5)
WBC: 4.4 10*3/uL (ref 4.0–10.5)

## 2014-04-10 MED ORDER — DARBEPOETIN ALFA-POLYSORBATE 500 MCG/ML IJ SOLN
500.0000 ug | Freq: Once | INTRAMUSCULAR | Status: AC
  Administered 2014-04-10: 500 ug via SUBCUTANEOUS

## 2014-04-10 MED ORDER — DARBEPOETIN ALFA-POLYSORBATE 500 MCG/ML IJ SOLN
INTRAMUSCULAR | Status: AC
  Filled 2014-04-10: qty 1

## 2014-04-10 MED ORDER — HEPARIN SOD (PORK) LOCK FLUSH 100 UNIT/ML IV SOLN
500.0000 [IU] | Freq: Once | INTRAVENOUS | Status: AC
  Administered 2014-04-10: 500 [IU] via INTRAVENOUS
  Filled 2014-04-10: qty 5

## 2014-04-10 MED ORDER — SODIUM CHLORIDE 0.9 % IJ SOLN
10.0000 mL | INTRAMUSCULAR | Status: DC | PRN
  Administered 2014-04-10: 10 mL via INTRAVENOUS

## 2014-04-10 NOTE — Progress Notes (Signed)
Labs for cbcd,ferr 

## 2014-04-10 NOTE — Progress Notes (Signed)
[name] [name]'s reason for visit today is for an injection and labs as scheduled per MD orders.  Labs were drawn prior to administration of ordered medication [name] [name] also received aranesp 500 mcg per MD orders; see [facility] for administration details.  [name] [name] tolerated all procedures well and without incident; questions were answered and patient was discharged.

## 2014-04-10 NOTE — Progress Notes (Signed)
[name] [name] presented for Portacath access and flush.  Proper placement of portacath confirmed by CXR.  Portacath located left chest wall accessed with  H 20 needle.  Good blood return present. Portacath flushed with 99ml NS and 500U/56ml Heparin and needle removed intact.  Procedure tolerated well and without incident.

## 2014-04-26 ENCOUNTER — Encounter (HOSPITAL_COMMUNITY): Payer: Self-pay

## 2014-04-26 ENCOUNTER — Encounter (HOSPITAL_BASED_OUTPATIENT_CLINIC_OR_DEPARTMENT_OTHER): Payer: Medicare Other

## 2014-04-26 ENCOUNTER — Encounter (HOSPITAL_COMMUNITY): Payer: Medicare Other | Attending: Oncology

## 2014-04-26 VITALS — Temp 98.0°F | Wt 218.0 lb

## 2014-04-26 VITALS — BP 151/84 | HR 68 | Temp 98.1°F | Resp 18

## 2014-04-26 DIAGNOSIS — D631 Anemia in chronic kidney disease: Secondary | ICD-10-CM | POA: Diagnosis not present

## 2014-04-26 DIAGNOSIS — E119 Type 2 diabetes mellitus without complications: Secondary | ICD-10-CM

## 2014-04-26 DIAGNOSIS — C8290 Follicular lymphoma, unspecified, unspecified site: Secondary | ICD-10-CM | POA: Diagnosis present

## 2014-04-26 DIAGNOSIS — N184 Chronic kidney disease, stage 4 (severe): Secondary | ICD-10-CM | POA: Diagnosis present

## 2014-04-26 DIAGNOSIS — N189 Chronic kidney disease, unspecified: Secondary | ICD-10-CM

## 2014-04-26 DIAGNOSIS — Z5112 Encounter for antineoplastic immunotherapy: Secondary | ICD-10-CM | POA: Diagnosis not present

## 2014-04-26 DIAGNOSIS — Z23 Encounter for immunization: Secondary | ICD-10-CM | POA: Diagnosis not present

## 2014-04-26 DIAGNOSIS — D509 Iron deficiency anemia, unspecified: Secondary | ICD-10-CM

## 2014-04-26 DIAGNOSIS — R300 Dysuria: Secondary | ICD-10-CM | POA: Diagnosis present

## 2014-04-26 DIAGNOSIS — I10 Essential (primary) hypertension: Secondary | ICD-10-CM

## 2014-04-26 LAB — URINALYSIS, ROUTINE W REFLEX MICROSCOPIC
Bilirubin Urine: NEGATIVE
GLUCOSE, UA: 250 mg/dL — AB
Hgb urine dipstick: NEGATIVE
Ketones, ur: NEGATIVE mg/dL
Nitrite: NEGATIVE
Specific Gravity, Urine: 1.015 (ref 1.005–1.030)
Urobilinogen, UA: 0.2 mg/dL (ref 0.0–1.0)
pH: 6 (ref 5.0–8.0)

## 2014-04-26 LAB — COMPREHENSIVE METABOLIC PANEL
ALBUMIN: 3.4 g/dL — AB (ref 3.5–5.2)
ALK PHOS: 108 U/L (ref 39–117)
ALT: 13 U/L (ref 0–35)
ANION GAP: 12 (ref 5–15)
AST: 18 U/L (ref 0–37)
BUN: 40 mg/dL — AB (ref 6–23)
CHLORIDE: 101 meq/L (ref 96–112)
CO2: 26 mEq/L (ref 19–32)
Calcium: 9.6 mg/dL (ref 8.4–10.5)
Creatinine, Ser: 1.97 mg/dL — ABNORMAL HIGH (ref 0.50–1.10)
GFR calc Af Amer: 28 mL/min — ABNORMAL LOW (ref >90)
GFR calc non Af Amer: 24 mL/min — ABNORMAL LOW (ref >90)
Glucose, Bld: 269 mg/dL — ABNORMAL HIGH (ref 70–99)
Potassium: 5.3 mEq/L (ref 3.7–5.3)
SODIUM: 139 meq/L (ref 137–147)
Total Bilirubin: 0.2 mg/dL — ABNORMAL LOW (ref 0.3–1.2)
Total Protein: 6.3 g/dL (ref 6.0–8.3)

## 2014-04-26 LAB — CBC WITH DIFFERENTIAL/PLATELET
BASOS PCT: 1 % (ref 0–1)
Basophils Absolute: 0 10*3/uL (ref 0.0–0.1)
Eosinophils Absolute: 0.3 10*3/uL (ref 0.0–0.7)
Eosinophils Relative: 7 % — ABNORMAL HIGH (ref 0–5)
HCT: 40.2 % (ref 36.0–46.0)
HEMOGLOBIN: 9.9 g/dL — AB (ref 12.0–15.0)
Lymphocytes Relative: 18 % (ref 12–46)
Lymphs Abs: 0.7 10*3/uL (ref 0.7–4.0)
MCH: 29.8 pg (ref 26.0–34.0)
MCHC: 24.6 g/dL — ABNORMAL LOW (ref 30.0–36.0)
MCV: 121.1 fL — ABNORMAL HIGH (ref 78.0–100.0)
MONOS PCT: 15 % — AB (ref 3–12)
Monocytes Absolute: 0.6 10*3/uL (ref 0.1–1.0)
Neutro Abs: 2.5 10*3/uL (ref 1.7–7.7)
Neutrophils Relative %: 60 % (ref 43–77)
PLATELETS: 246 10*3/uL (ref 150–400)
RBC: 3.32 MIL/uL — AB (ref 3.87–5.11)
RDW: 16.6 % — ABNORMAL HIGH (ref 11.5–15.5)
WBC: 4.1 10*3/uL (ref 4.0–10.5)

## 2014-04-26 LAB — URINE MICROSCOPIC-ADD ON

## 2014-04-26 LAB — LACTATE DEHYDROGENASE: LDH: 248 U/L (ref 94–250)

## 2014-04-26 MED ORDER — SODIUM CHLORIDE 0.9 % IV SOLN
500.0000 mg/m2 | Freq: Once | INTRAVENOUS | Status: AC
  Administered 2014-04-26: 1100 mg via INTRAVENOUS
  Filled 2014-04-26: qty 110

## 2014-04-26 MED ORDER — DARBEPOETIN ALFA-POLYSORBATE 500 MCG/ML IJ SOLN
500.0000 ug | Freq: Once | INTRAMUSCULAR | Status: AC
  Administered 2014-04-26: 500 ug via SUBCUTANEOUS

## 2014-04-26 MED ORDER — SODIUM CHLORIDE 0.9 % IJ SOLN
10.0000 mL | INTRAMUSCULAR | Status: DC | PRN
  Administered 2014-04-26: 10 mL

## 2014-04-26 MED ORDER — ACETAMINOPHEN 325 MG PO TABS
650.0000 mg | ORAL_TABLET | Freq: Once | ORAL | Status: AC
  Administered 2014-04-26: 650 mg via ORAL
  Filled 2014-04-26: qty 2

## 2014-04-26 MED ORDER — HEPARIN SOD (PORK) LOCK FLUSH 100 UNIT/ML IV SOLN
500.0000 [IU] | Freq: Once | INTRAVENOUS | Status: AC | PRN
  Administered 2014-04-26: 500 [IU]
  Filled 2014-04-26: qty 5

## 2014-04-26 MED ORDER — SODIUM CHLORIDE 0.9 % IV SOLN
Freq: Once | INTRAVENOUS | Status: AC
  Administered 2014-04-26: 09:00:00 via INTRAVENOUS

## 2014-04-26 MED ORDER — DIPHENHYDRAMINE HCL 25 MG PO CAPS
50.0000 mg | ORAL_CAPSULE | Freq: Once | ORAL | Status: AC
  Administered 2014-04-26: 50 mg via ORAL
  Filled 2014-04-26: qty 2

## 2014-04-26 MED ORDER — DARBEPOETIN ALFA-POLYSORBATE 500 MCG/ML IJ SOLN
INTRAMUSCULAR | Status: AC
  Filled 2014-04-26: qty 1

## 2014-04-26 MED ORDER — INFLUENZA VAC SPLIT QUAD 0.5 ML IM SUSY
0.5000 mL | PREFILLED_SYRINGE | Freq: Once | INTRAMUSCULAR | Status: AC
  Administered 2014-04-26: 0.5 mL via INTRAMUSCULAR
  Filled 2014-04-26: qty 0.5

## 2014-04-26 NOTE — Patient Instructions (Signed)
[facility] Discharge Instructions  RECOMMENDATIONS MADE BY THE CONSULTANT AND ANY TEST RESULTS WILL BE SENT TO YOUR REFERRING PHYSICIAN.  EXAM FINDINGS BY THE PHYSICIAN TODAY AND SIGNS OR SYMPTOMS TO REPORT TO CLINIC OR PRIMARY PHYSICIAN: Exam and findings as discussed by Dr. Barnet Glasgow.  Your urine was clear so no urinary tract infection.  Drink plenty of fluids.   Report fevers, chills or other symptoms.    INSTRUCTIONS/FOLLOW-UP: Follow-up in 3 weeks with labs, office visit, Rituxan and possible aranesp.  Thank you for choosing [facility] to provide your oncology and hematology care.  To afford each patient quality time with our providers, please arrive at least 15 minutes before your scheduled appointment time.  With your help, our goal is to use those 15 minutes to complete the necessary work-up to ensure our physicians have the information they need to help with your evaluation and healthcare recommendations.    Effective January 1st, 2014, we ask that you re-schedule your appointment with our physicians should you arrive 10 or more minutes late for your appointment.  We strive to give you quality time with our providers, and arriving late affects you and other patients whose appointments are after yours.    Again, thank you for choosing [facility].  Our hope is that these requests will decrease the amount of time that you wait before being seen by our physicians.       _____________________________________________________________  Should you have questions after your visit to [facility], please contact our office at [phone] between the hours of 8:30 a.m. and 4:30 p.m.  Voicemails left after 4:30 p.m. will not be returned until the following business day.  For prescription refill requests, have your pharmacy contact our office with your prescription refill request.     _______________________________________________________________  We hope that we have given you very good care.  You may receive a patient satisfaction survey in the mail, please complete it and return it as soon as possible.  We value your feedback!  _______________________________________________________________  Have you asked about our STAR program?  STAR stands for Survivorship Training and Rehabilitation, and this is a nationally recognized cancer care program that focuses on survivorship and rehabilitation.  Cancer and cancer treatments may cause problems, such as, pain, making you feel tired and keeping you from doing the things that you need or want to do. Cancer rehabilitation can help. Our goal is to reduce these troubling effects and help you have the best quality of life possible.  You may receive a survey from a nurse that asks questions about your current state of health.  Based on the survey results, all eligible patients will be referred to the [facility] program for an evaluation so we can better serve you!  A frequently asked questions sheet is available upon request.

## 2014-04-26 NOTE — Progress Notes (Signed)
[facility]  OFFICE PROGRESS NOTE  [name], MD [place] [address]  DIAGNOSIS: Follicular lymphoma  Chronic kidney disease (CKD), stage IV (severe)  Anemia in chronic kidney disease  Dysuria - Plan: Urinalysis, Routine w reflex microscopic, Urine culture  Chief Complaint  Patient presents with  . follicular lymphoma  . Dysuria    CURRENT THERAPY: Maintenance Rituxan for cycle #4 today having completed 6 cycles of bendamustine/Rituxan in October 2014 with a first maintenance treatment started on 07/25/2013. Aranesp every 3 weeks for anemia in kidney failure with last dose on 04/10/2014   INTERVAL HISTORY: [name] [name] 73 y.o. female returns for for continuation of maintenance Rituxan after definitive treatment for follicular lymphoma in the setting of chronic renal failure with anemia treated with Aranesp, last dose on 04/10/2014. She developed an area on the left wrist after working outside it swelled with a vesicle present with itching that has now completely cleared. She just The area clean. She's had burning on urination for the past 2 weeks and does have medicine at home to help with those symptoms. This has occurred in the past. She denies any fever, night sweats, PND, orthopnea, palpitations, worsening lower extremity swelling or redness, easy satiety, cough, wheezing, sore throat, headache, or seizures.    MEDICAL HISTORY: Past Medical History  Diagnosis Date  . Hypertension   . Diabetes mellitus, type 2   . Hyperlipidemia   . Iron deficiency anemia 11/21/2012    At time of presentation.  Feraheme 1020 mg on 11/21/12.  . Follicular lymphoma 6/94/5038    Right ureteral obstruction by lymphadenopathy-> right hydronephrosis  . Port catheter in place 06/06/2013    INTERIM HISTORY: has Follicular lymphoma; Iron deficiency anemia; Chronic kidney disease (CKD), stage IV (severe); Diabetes mellitus type 2  in obese; Hypertension; UTI (urinary tract infection); Leukocytosis, unspecified; Hypoglycemia; and Port catheter in place on her problem list.    ALLERGIES:  has No Known Allergies.  MEDICATIONS: has a current medication list which includes the following prescription(s): acetaminophen, atorvastatin, insulin detemir, lidocaine-prilocaine, multivitamin with minerals, dexamethasone, febuxostat, fluconazole, furosemide, metoclopramide, and ondansetron.  SURGICAL HISTORY:  Past Surgical History  Procedure Laterality Date  . Cataract extraction    . Cystoscopy w/ ureteral stent placement Right 11/03/2012    Procedure: CYSTOSCOPY WITH RETROGRADE PYELOGRAM/URETERAL STENT PLACEMENT;  Surgeon: Malka So, MD;  Location: [facility];  Service: Urology;  Laterality: Right;  . Portacath placement Left 11/03/2012    Procedure: INSERTION PORT-A-CATH;  Surgeon: Scherry Ran, MD;  Location: [facility];  Service: General;  Laterality: Left;  Insertion Port-A-Cath Left Subclavian  . Appendectomy    . Cystoscopy w/ ureteral stent placement Right 03/23/2013    Procedure: CYSTOSCOPY WITH STENT REPLACEMENT;  Surgeon: Malka So, MD;  Location: [facility];  Service: Urology;  Laterality: Right;    FAMILY HISTORY: family history includes Cancer in her brother and father.  SOCIAL HISTORY:  reports that she has never smoked. She has never used smokeless tobacco. She reports that she does not drink alcohol or use illicit drugs.  REVIEW OF SYSTEMS:  Other than that discussed above is noncontributory.  PHYSICAL EXAMINATION: ECOG PERFORMANCE STATUS: 1 - Symptomatic but completely ambulatory  Temperature 98 F (36.7 C), weight 218 lb (98.884 kg).  GENERAL:alert, no distress and comfortable. Moderately obese. SKIN: skin color, texture, turgor are normal, no rashes or significant lesions EYES: PERLA; Conjunctiva are pink  and non-injected, sclera clear SINUSES: No redness or tenderness over maxillary or ethmoid  sinuses OROPHARYNX:no exudate, no erythema on lips, buccal mucosa, or tongue. NECK: supple, thyroid normal size, non-tender, without nodularity. No masses CHEST: LifePort in place. No breast masses. LYMPH:  no palpable lymphadenopathy in the cervical, axillary or inguinal LUNGS: clear to auscultation and percussion with normal breathing effort HEART: regular rate & rhythm and no murmurs. ABDOMEN:abdomen soft, non-tender and normal bowel sounds. Obese and soft with no organomegaly, ascites, or CVA tenderness. MUSCULOSKELETAL:no cyanosis of digits and no clubbing. Range of motion normal. Left upper extremity wrist with an area of macular hyperpigmentation with no evidence of purulence or bleeding. NEURO: alert & oriented x 3 with fluent speech, no focal motor/sensory deficits   LABORATORY DATA: Infusion on 04/26/2014  Component Date Value Ref Range Status  . WBC 04/26/2014 4.1  4.0 - 10.5 K/uL Final  . RBC 04/26/2014 3.32* 3.87 - 5.11 MIL/uL Final  . Hemoglobin 04/26/2014 9.9* 12.0 - 15.0 g/dL Final  . HCT 04/26/2014 40.2  36.0 - 46.0 % Final  . MCV 04/26/2014 121.1* 78.0 - 100.0 fL Final  . MCH 04/26/2014 29.8  26.0 - 34.0 pg Final  . MCHC 04/26/2014 24.6* 30.0 - 36.0 g/dL Final  . RDW 04/26/2014 16.6* 11.5 - 15.5 % Final  . Platelets 04/26/2014 246  150 - 400 K/uL Final  . Neutrophils Relative % 04/26/2014 60  43 - 77 % Final  . Neutro Abs 04/26/2014 2.5  1.7 - 7.7 K/uL Final  . Lymphocytes Relative 04/26/2014 18  12 - 46 % Final  . Lymphs Abs 04/26/2014 0.7  0.7 - 4.0 K/uL Final  . Monocytes Relative 04/26/2014 15* 3 - 12 % Final  . Monocytes Absolute 04/26/2014 0.6  0.1 - 1.0 K/uL Final  . Eosinophils Relative 04/26/2014 7* 0 - 5 % Final  . Eosinophils Absolute 04/26/2014 0.3  0.0 - 0.7 K/uL Final  . Basophils Relative 04/26/2014 1  0 - 1 % Final  . Basophils Absolute 04/26/2014 0.0  0.0 - 0.1 K/uL Final  . Sodium 04/26/2014 139  137 - 147 mEq/L Final  . Potassium 04/26/2014 5.3   3.7 - 5.3 mEq/L Final  . Chloride 04/26/2014 101  96 - 112 mEq/L Final  . CO2 04/26/2014 26  19 - 32 mEq/L Final  . Glucose, Bld 04/26/2014 269* 70 - 99 mg/dL Final  . BUN 04/26/2014 40* 6 - 23 mg/dL Final  . Creatinine, Ser 04/26/2014 1.97* 0.50 - 1.10 mg/dL Final  . Calcium 04/26/2014 9.6  8.4 - 10.5 mg/dL Final  . Total Protein 04/26/2014 6.3  6.0 - 8.3 g/dL Final  . Albumin 04/26/2014 3.4* 3.5 - 5.2 g/dL Final  . AST 04/26/2014 18  0 - 37 U/L Final  . ALT 04/26/2014 13  0 - 35 U/L Final  . Alkaline Phosphatase 04/26/2014 108  39 - 117 U/L Final  . Total Bilirubin 04/26/2014 0.2* 0.3 - 1.2 mg/dL Final  . GFR calc non Af Amer 04/26/2014 24* >90 mL/min Final  . GFR calc Af Amer 04/26/2014 28* >90 mL/min Final   Comment: (NOTE)                          The eGFR has been calculated using the CKD EPI equation.                          This calculation  has not been validated in all clinical situations.                          eGFR's persistently <90 mL/min signify possible Chronic Kidney                          Disease.  . Anion gap 04/26/2014 12  5 - 15 Final  . LDH 04/26/2014 248  94 - 250 U/L Final  . Color, Urine 04/26/2014 YELLOW  YELLOW Final  . APPearance 04/26/2014 CLEAR  CLEAR Final  . Specific Gravity, Urine 04/26/2014 1.015  1.005 - 1.030 Final  . pH 04/26/2014 6.0  5.0 - 8.0 Final  . Glucose, UA 04/26/2014 250* NEGATIVE mg/dL Final  . Hgb urine dipstick 04/26/2014 NEGATIVE  NEGATIVE Final  . Bilirubin Urine 04/26/2014 NEGATIVE  NEGATIVE Final  . Ketones, ur 04/26/2014 NEGATIVE  NEGATIVE mg/dL Final  . Protein, ur 04/26/2014 TRACE* NEGATIVE mg/dL Final  . Urobilinogen, UA 04/26/2014 0.2  0.0 - 1.0 mg/dL Final  . Nitrite 04/26/2014 NEGATIVE  NEGATIVE Final  . Leukocytes, UA 04/26/2014 TRACE* NEGATIVE Final  . Squamous Epithelial / LPF 04/26/2014 FEW* RARE Final  . WBC, UA 04/26/2014 3-6  <3 WBC/hpf Final  . Bacteria, UA 04/26/2014 FEW* RARE Final  Lab on 04/10/2014   Component Date Value Ref Range Status  . WBC 04/10/2014 4.4  4.0 - 10.5 K/uL Final  . RBC 04/10/2014 3.56* 3.87 - 5.11 MIL/uL Final  . Hemoglobin 04/10/2014 10.5* 12.0 - 15.0 g/dL Final  . HCT 04/10/2014 33.7* 36.0 - 46.0 % Final  . MCV 04/10/2014 94.7  78.0 - 100.0 fL Final  . MCH 04/10/2014 29.5  26.0 - 34.0 pg Final  . MCHC 04/10/2014 31.2  30.0 - 36.0 g/dL Final  . RDW 04/10/2014 16.6* 11.5 - 15.5 % Final  . Platelets 04/10/2014 256  150 - 400 K/uL Final  . Ferritin 04/10/2014 358* 10 - 291 ng/mL Final   Performed at [facility]: No new pathology.  Urinalysis    Component Value Date/Time   COLORURINE YELLOW 04/26/2014 1030   APPEARANCEUR CLEAR 04/26/2014 1030   LABSPEC 1.015 04/26/2014 1030   PHURINE 6.0 04/26/2014 1030   GLUCOSEU 250* 04/26/2014 1030   HGBUR NEGATIVE 04/26/2014 1030   BILIRUBINUR NEGATIVE 04/26/2014 1030   KETONESUR NEGATIVE 04/26/2014 1030   PROTEINUR TRACE* 04/26/2014 1030   UROBILINOGEN 0.2 04/26/2014 1030   NITRITE NEGATIVE 04/26/2014 1030   LEUKOCYTESUR TRACE* 04/26/2014 1030    RADIOGRAPHIC STUDIES: No results found.  ASSESSMENT:  #1.Follicular lymphoma, status post 6 cycles of bendamustine and Rituxan, no evidence of disease. To begin maintenance Rituxan on 07/25/2013  #2. Persistent right hydronephrosis, status post stenting with continued urinary symptoms.  #3. Hypertension, controlled.  #4. Diabetes mellitus, type II, non-insulin requiring, controlled.  #5. Iron deficiency anemia, status post Feraheme in May of 2014, stable with hemoglobin of 9.1on 01/16/2014.  #6. Anemia secondary to chronic renal failure, Aranesp every 3 weeks with last treatment on 01/16/2014  #7. Leukopenia secondary to previous chemotherapy, resolved.  #8. Dysuria, possible UTI. #9. Suspect left upper extremity insect bite.      PLAN:  #1. Influenza virus vaccine was given today. #2. Rituxan 500 mg per meter squared IV. #3. Urinalysis with  urine culture and sensitivity. Continue oral urinary anesthetic. If cultures are positive, antibiotics will be ordered. #4. Aranesp 500 mcg subcutaneously  today and every 3 weeks if hemoglobin is less than 11. #5. Port flush in 6 weeks with return visit for additional Rituxan in 3 months at which time CBC, chem profile, LDH, reticulocyte count, and beta-2 microglobulin will be done.   All questions were answered. The patient knows to call the clinic with any problems, questions or concerns. We can certainly see the patient much sooner if necessary.   I spent 25 minutes counseling the patient face to face. The total time spent in the appointment was 30 minutes.    Doroteo Bradford, MD 04/27/2014 10:59 AM  DISCLAIMER:  This note was dictated with voice recognition software.  Similar sounding words can inadvertently be transcribed inaccurately and may not be corrected upon review.

## 2014-04-26 NOTE — Progress Notes (Signed)
Tolerated infusion well. 

## 2014-04-26 NOTE — Progress Notes (Signed)
[name] [name] presents today for injection per MD orders. Aranesp 500 mcg administered SQ in right Abdomen. Administration without incident. Patient tolerated well. Tolerated chemotherapy well.

## 2014-04-28 LAB — URINE CULTURE: Colony Count: 40000

## 2014-04-29 LAB — BETA 2 MICROGLOBULIN, SERUM: Beta-2 Microglobulin: 7.94 mg/L — ABNORMAL HIGH (ref <=2.51)

## 2014-05-09 ENCOUNTER — Other Ambulatory Visit (HOSPITAL_COMMUNITY): Payer: Self-pay

## 2014-05-09 DIAGNOSIS — C8290 Follicular lymphoma, unspecified, unspecified site: Secondary | ICD-10-CM

## 2014-05-17 ENCOUNTER — Encounter (HOSPITAL_BASED_OUTPATIENT_CLINIC_OR_DEPARTMENT_OTHER): Payer: Medicare Other

## 2014-05-17 ENCOUNTER — Encounter (HOSPITAL_COMMUNITY): Payer: Medicare Other | Attending: Oncology

## 2014-05-17 DIAGNOSIS — C8290 Follicular lymphoma, unspecified, unspecified site: Secondary | ICD-10-CM | POA: Insufficient documentation

## 2014-05-17 DIAGNOSIS — R300 Dysuria: Secondary | ICD-10-CM | POA: Insufficient documentation

## 2014-05-17 DIAGNOSIS — N184 Chronic kidney disease, stage 4 (severe): Secondary | ICD-10-CM

## 2014-05-17 DIAGNOSIS — D631 Anemia in chronic kidney disease: Secondary | ICD-10-CM

## 2014-05-17 LAB — CBC
HEMATOCRIT: 34.2 % — AB (ref 36.0–46.0)
Hemoglobin: 10.9 g/dL — ABNORMAL LOW (ref 12.0–15.0)
MCH: 29.9 pg (ref 26.0–34.0)
MCHC: 31.9 g/dL (ref 30.0–36.0)
MCV: 94 fL (ref 78.0–100.0)
Platelets: 245 10*3/uL (ref 150–400)
RBC: 3.64 MIL/uL — ABNORMAL LOW (ref 3.87–5.11)
RDW: 16.9 % — ABNORMAL HIGH (ref 11.5–15.5)
WBC: 4.3 10*3/uL (ref 4.0–10.5)

## 2014-05-17 MED ORDER — DARBEPOETIN ALFA 500 MCG/ML IJ SOSY
500.0000 ug | PREFILLED_SYRINGE | Freq: Once | INTRAMUSCULAR | Status: AC
  Administered 2014-05-17: 500 ug via SUBCUTANEOUS
  Filled 2014-05-17: qty 1

## 2014-05-17 NOTE — Progress Notes (Signed)
[name] [name] presents today for injection per MD orders. Aranesp 500 mcg administered SQ in right Abdomen. Administration without incident. Patient tolerated well.

## 2014-05-17 NOTE — Progress Notes (Signed)
LABS FOR CBC 

## 2014-05-17 NOTE — Patient Instructions (Signed)
Aranesp 500 mcg given today We will give your shot every 3 weeks Port needs to be flushed every 6 weeks Rituxan every 12 weeks

## 2014-06-10 ENCOUNTER — Encounter (HOSPITAL_COMMUNITY): Payer: Medicare Other

## 2014-06-10 ENCOUNTER — Encounter (HOSPITAL_BASED_OUTPATIENT_CLINIC_OR_DEPARTMENT_OTHER): Payer: Medicare Other

## 2014-06-10 ENCOUNTER — Encounter (HOSPITAL_COMMUNITY): Payer: Self-pay

## 2014-06-10 DIAGNOSIS — R300 Dysuria: Secondary | ICD-10-CM | POA: Diagnosis not present

## 2014-06-10 DIAGNOSIS — Z452 Encounter for adjustment and management of vascular access device: Secondary | ICD-10-CM

## 2014-06-10 DIAGNOSIS — C8290 Follicular lymphoma, unspecified, unspecified site: Secondary | ICD-10-CM

## 2014-06-10 DIAGNOSIS — D631 Anemia in chronic kidney disease: Secondary | ICD-10-CM

## 2014-06-10 DIAGNOSIS — N184 Chronic kidney disease, stage 4 (severe): Secondary | ICD-10-CM

## 2014-06-10 LAB — CBC
HCT: 32.1 % — ABNORMAL LOW (ref 36.0–46.0)
Hemoglobin: 10.2 g/dL — ABNORMAL LOW (ref 12.0–15.0)
MCH: 30 pg (ref 26.0–34.0)
MCHC: 31.8 g/dL (ref 30.0–36.0)
MCV: 94.4 fL (ref 78.0–100.0)
Platelets: 238 10*3/uL (ref 150–400)
RBC: 3.4 MIL/uL — ABNORMAL LOW (ref 3.87–5.11)
RDW: 17.1 % — AB (ref 11.5–15.5)
WBC: 2.9 10*3/uL — ABNORMAL LOW (ref 4.0–10.5)

## 2014-06-10 LAB — FERRITIN: Ferritin: 287 ng/mL (ref 10–291)

## 2014-06-10 MED ORDER — SODIUM CHLORIDE 0.9 % IJ SOLN
10.0000 mL | INTRAMUSCULAR | Status: DC | PRN
  Administered 2014-06-10: 10 mL via INTRAVENOUS
  Filled 2014-06-10: qty 10

## 2014-06-10 MED ORDER — DARBEPOETIN ALFA 500 MCG/ML IJ SOSY
500.0000 ug | PREFILLED_SYRINGE | Freq: Once | INTRAMUSCULAR | Status: AC
  Administered 2014-06-10: 500 ug via SUBCUTANEOUS
  Filled 2014-06-10: qty 1

## 2014-06-10 MED ORDER — HEPARIN SOD (PORK) LOCK FLUSH 100 UNIT/ML IV SOLN
500.0000 [IU] | Freq: Once | INTRAVENOUS | Status: AC
  Administered 2014-06-10: 500 [IU] via INTRAVENOUS

## 2014-06-10 MED ORDER — HEPARIN SOD (PORK) LOCK FLUSH 100 UNIT/ML IV SOLN
INTRAVENOUS | Status: AC
  Filled 2014-06-10: qty 5

## 2014-06-10 NOTE — Patient Instructions (Signed)
[facility] Discharge Instructions  RECOMMENDATIONS MADE BY THE CONSULTANT AND ANY TEST RESULTS WILL BE SENT TO YOUR REFERRING PHYSICIAN.  Return as scheduled for lab work, injections, port flushes, and office visit. Please contact the clinic with any questions or concerns.  Thank you for choosing [facility] to provide your oncology and hematology care.  To afford each patient quality time with our providers, please arrive at least 15 minutes before your scheduled appointment time.  With your help, our goal is to use those 15 minutes to complete the necessary work-up to ensure our physicians have the information they need to help with your evaluation and healthcare recommendations.    Effective January 1st, 2014, we ask that you re-schedule your appointment with our physicians should you arrive 10 or more minutes late for your appointment.  We strive to give you quality time with our providers, and arriving late affects you and other patients whose appointments are after yours.    Again, thank you for choosing [facility].  Our hope is that these requests will decrease the amount of time that you wait before being seen by our physicians.       _____________________________________________________________  Should you have questions after your visit to [facility] please contact our office at [phone] between the hours of 8:30 a.m. and 4:30 p.m.  Voicemails left after 4:30 p.m. will not be returned until the following business day.  For prescription refill requests, have your pharmacy contact our office with your prescription refill request.    _______________________________________________________________  We hope that we have given you very good care.  You may receive a patient satisfaction survey in the mail, please complete it and return it as soon as possible.  We value your  feedback!  _______________________________________________________________  Have you asked about our STAR program?  STAR stands for Survivorship Training and Rehabilitation, and this is a nationally recognized cancer care program that focuses on survivorship and rehabilitation.  Cancer and cancer treatments may cause problems, such as, pain, making you feel tired and keeping you from doing the things that you need or want to do. Cancer rehabilitation can help. Our goal is to reduce these troubling effects and help you have the best quality of life possible.  You may receive a survey from a nurse that asks questions about your current state of health.  Based on the survey results, all eligible patients will be referred to the [facility] program for an evaluation so we can better serve you!  A frequently asked questions sheet is available upon request. Thank you for choosing [facility] to provide your oncology and hematology care.  To afford each patient quality time with our providers, please arrive at least 15 minutes before your scheduled appointment time.  With your help, our goal is to use those 15 minutes to complete the necessary work-up to ensure our physicians have the information they need to help with your evaluation and healthcare recommendations.    Effective January 1st, 2014, we ask that you re-schedule your appointment with our physicians should you arrive 10 or more minutes late for your appointment.  We strive to give you quality time with our providers, and arriving late affects you and other patients whose appointments are after yours.    Again, thank you for choosing [facility].  Our hope is that these requests will decrease the amount of time that you wait before  being seen by our physicians.       _____________________________________________________________  Should you have questions after your visit to [facility], please contact our office at  [phone] between the hours of 8:30 a.m. and 4:30 p.m.  Voicemails left after 4:30 p.m. will not be returned until the following business day.  For prescription refill requests, have your pharmacy contact our office with your prescription refill request.    _______________________________________________________________  We hope that we have given you very good care.  You may receive a patient satisfaction survey in the mail, please complete it and return it as soon as possible.  We value your feedback!  _______________________________________________________________  Have you asked about our STAR program?  STAR stands for Survivorship Training and Rehabilitation, and this is a nationally recognized cancer care program that focuses on survivorship and rehabilitation.  Cancer and cancer treatments may cause problems, such as, pain, making you feel tired and keeping you from doing the things that you need or want to do. Cancer rehabilitation can help. Our goal is to reduce these troubling effects and help you have the best quality of life possible.  You may receive a survey from a nurse that asks questions about your current state of health.  Based on the survey results, all eligible patients will be referred to the [facility] program for an evaluation so we can better serve you!  A frequently asked questions sheet is available upon request.

## 2014-06-10 NOTE — Progress Notes (Signed)
1050:  [name] [name] presented for Portacath access and flush.  Portacath located left chest wall accessed with  H 20 needle.  No blood return; flushes w/o difficulty; no c/o pain at site. Portacath flushed with 39ml NS and 500U/69ml Heparin and needle removed intact.  Procedure tolerated well and without incident.    1100:  Labs drawn from left antecubital with 23g butterfly needle; tolerated well.   1144:  [name] [name] presents today for injection per the provider's orders.  Aranesp administration without incident; see MAR for injection details.  Patient tolerated procedure well and without incident.  No questions or complaints noted at this time.

## 2014-06-24 IMAGING — CT CT ABD-PELV W/O CM
2 of 3 series · 16 of 46 positions shown, 18 images · non-contrast
Comparison: Ultrasound 08/15/2012.

CLINICAL DATA: Right-sided flank pain.  Hydronephrosis.

CT ABDOMEN AND PELVIS WITHOUT CONTRAST
TECHNIQUE: Multidetector CT imaging of the abdomen and pelvis was
performed following the standard protocol without intravenous
contrast.

[Series 2: standard/full over (age)lbs 5.0 · axial · 0.98mm/px · z∈[-414,-48]mm · 13 of 85 slices shown, 15 images]
[im 6/85  soft-tissue]
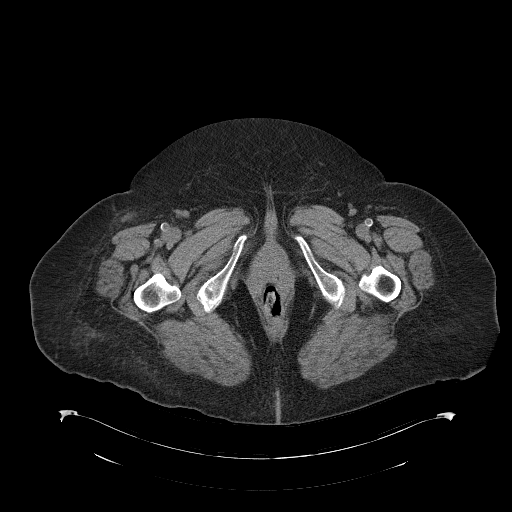
[im 6/85  bone]
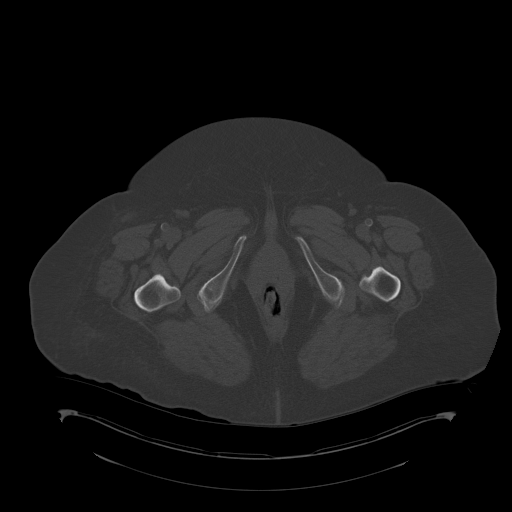
[im 11/85  soft-tissue]
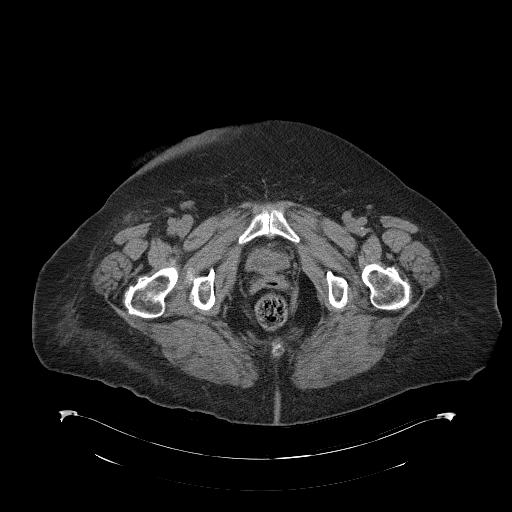
[im 17/85  soft-tissue]
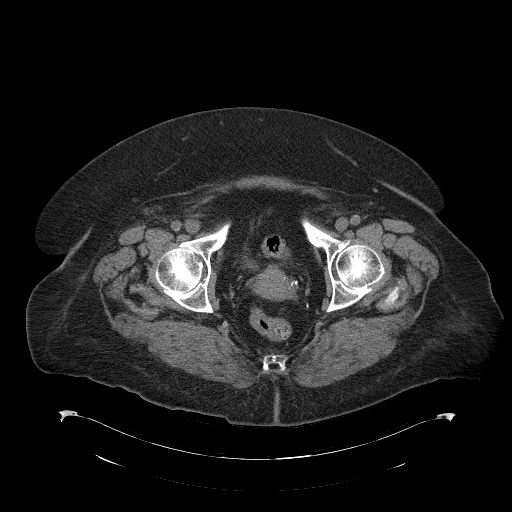
[im 25/85  soft-tissue]
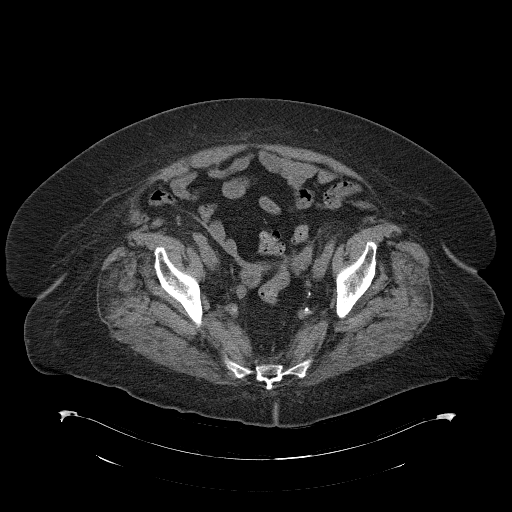
[im 30/85  soft-tissue]
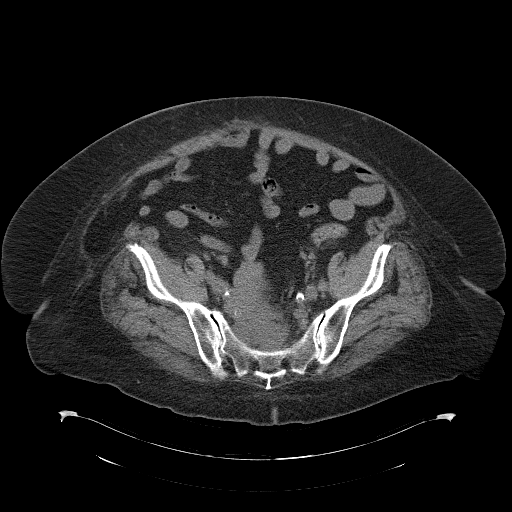
[im 36/85  soft-tissue]
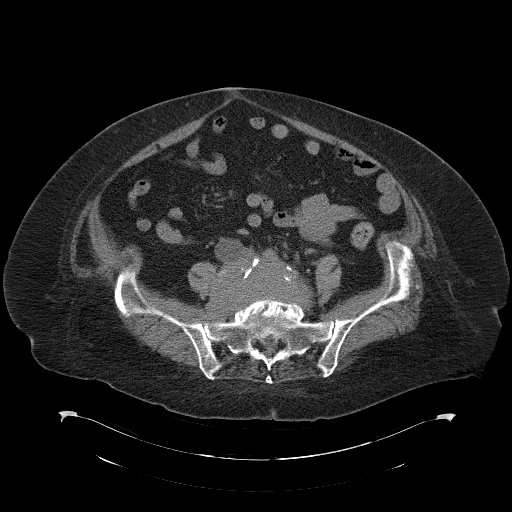
[im 44/85  soft-tissue]
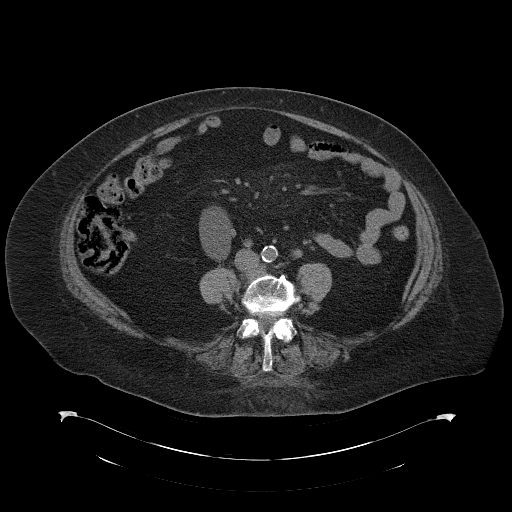
[im 49/85  soft-tissue]
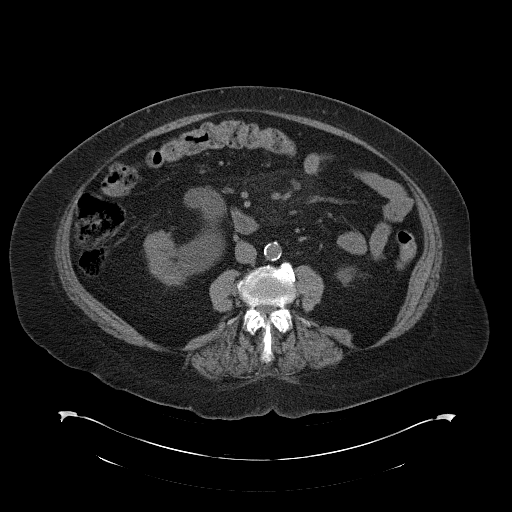
[im 55/85  soft-tissue]
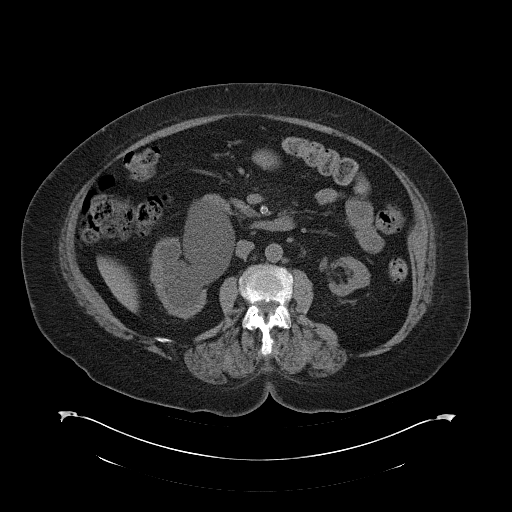
[im 55/85  bone]
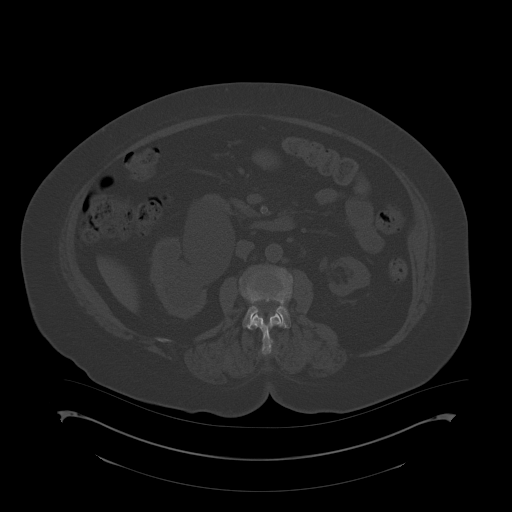
[im 60/85  soft-tissue]
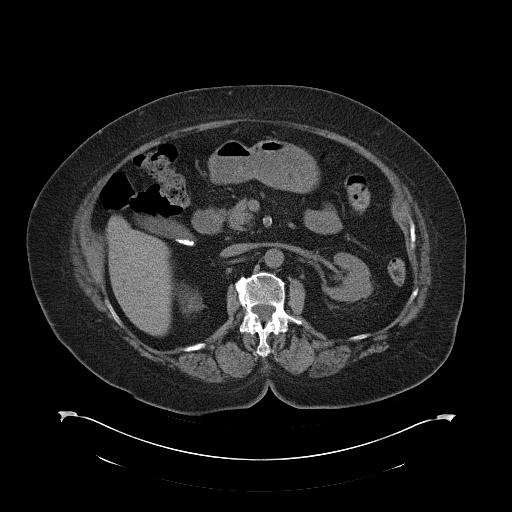
[im 68/85  soft-tissue]
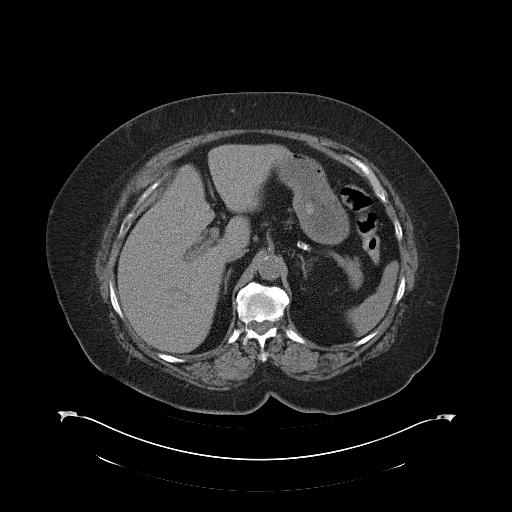
[im 74/85  soft-tissue]
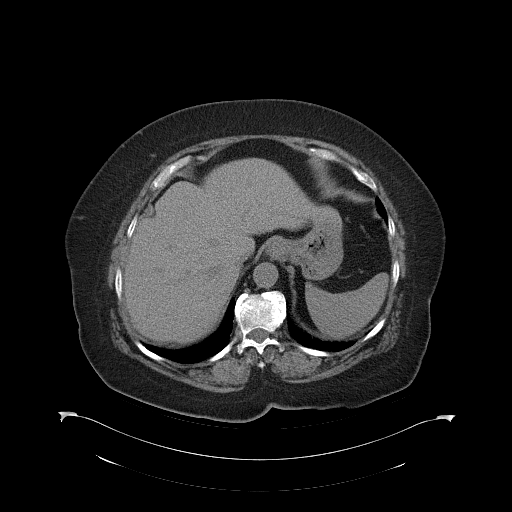
[im 79/85  soft-tissue]
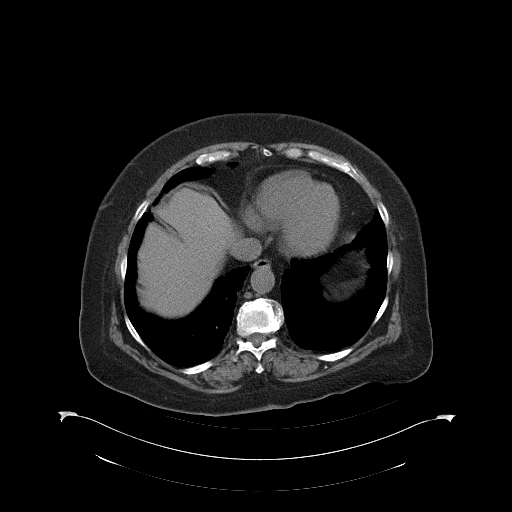

[Series 4: mpr coronal · coronal · 0.83mm/px · 3 of 106 slices shown]
[im 36/106  soft-tissue]
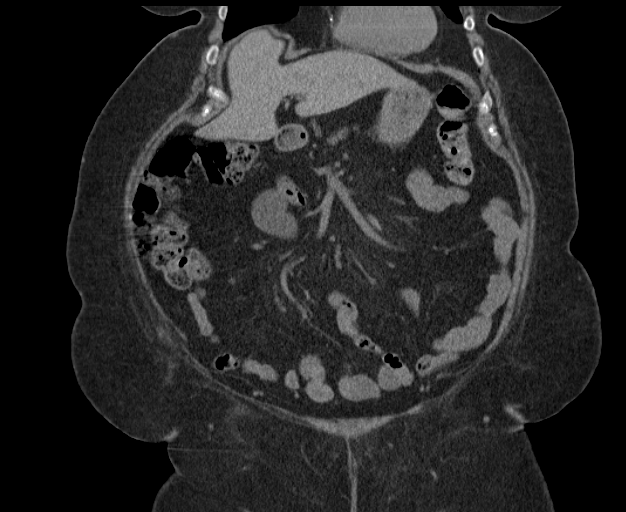
[im 47/106  soft-tissue]
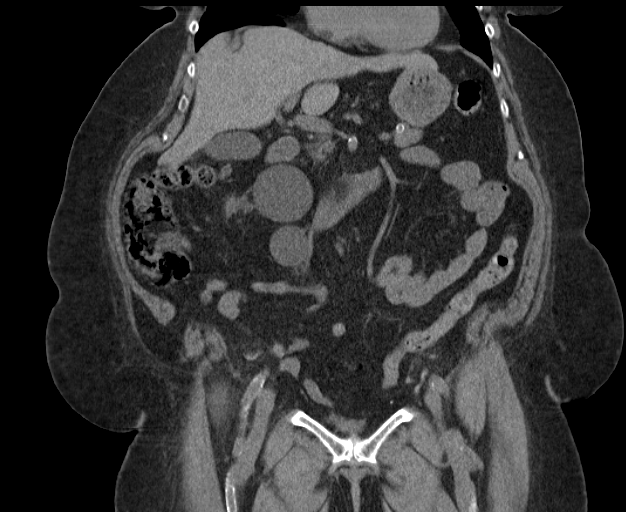
[im 59/106  soft-tissue]
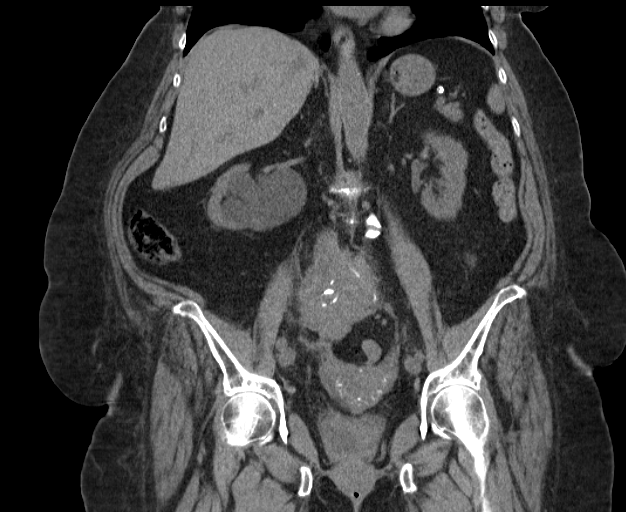

[16 of 46 positions shown; findings below may reference images not displayed]

FINDINGS: Lung Bases: Mild dependent atelectasis.

Liver:  Unenhanced CT was performed per clinician order.  Lack of
IV contrast limits sensitivity and specificity, especially for
evaluation of abdominal/pelvic solid viscera.  Scattered vascular
calcifications.  No mass lesion.

Spleen:  Normal.

Gallbladder:  Cholelithiasis.  No inflammatory changes.

Common bile duct:  Normal.

Pancreas:  Normal.

Adrenal glands:  Normal.

Kidneys:  Left renal cortical thinning compatible with medical
renal disease and / or scarring.  No left hydronephrosis.  No
calculi.  The left ureter appears normal.

Severe right hydronephrosis is present with right renal atrophy.
Dilated tortuous right ureter extending to incasing mass at the
level of the sacral promontory and iliac bifurcation (image number
54 series 2).  The mass demonstrates soft tissue attenuation and
measures 10 cm transverse by 4.1 cm AP.  Oblique craniocaudal
extent is 88 mm.  There is no definite osseous invasion and the
anterior cortex of the sacrum and L5 vertebra appears intact.

Stomach:  Patulous gastroesophageal junction.

Small bowel:  There is no mesenteric adenopathy.  Misty mesentery
is present.  No obstruction or mass lesion identified.

Colon:   Appendix not identified.  Colonic diverticulosis.  No
colonic obstruction.  Redundant sigmoid colon.

Pelvic Genitourinary:  Decompressed urinary bladder.  Atrophic
uterus.  No inguinal adenopathy.

Bones:  Spondylosis.  No destructive osseous lesions are
identified.  Pubic symphysis degenerative disease.  Sacroiliac
joint degenerative disease.

Vasculature: Atherosclerosis without aneurysm.

Tiny fat containing periumbilical hernia.
IMPRESSION: 1.  Obstructing presacral mass just inferior to the aortic
bifurcation in the retroperitoneum, extending from the L4-S2
vertebral levels.  This encases the right ureter with severe right
hydroureteronephrosis. Although the mass is nonspecific, lymphoma
or metastatic disease are the top considerations.
2.  Right greater than left renal atrophy.
3.  Cholelithiasis.
4.  Atherosclerosis.

## 2014-06-28 ENCOUNTER — Encounter (HOSPITAL_BASED_OUTPATIENT_CLINIC_OR_DEPARTMENT_OTHER): Payer: Medicare Other

## 2014-06-28 ENCOUNTER — Encounter (HOSPITAL_COMMUNITY): Payer: Medicare Other | Attending: Oncology

## 2014-06-28 DIAGNOSIS — C8290 Follicular lymphoma, unspecified, unspecified site: Secondary | ICD-10-CM | POA: Diagnosis present

## 2014-06-28 DIAGNOSIS — D631 Anemia in chronic kidney disease: Secondary | ICD-10-CM

## 2014-06-28 DIAGNOSIS — R300 Dysuria: Secondary | ICD-10-CM | POA: Diagnosis present

## 2014-06-28 DIAGNOSIS — N184 Chronic kidney disease, stage 4 (severe): Secondary | ICD-10-CM | POA: Diagnosis present

## 2014-06-28 LAB — CBC
HCT: 33.6 % — ABNORMAL LOW (ref 36.0–46.0)
Hemoglobin: 10.5 g/dL — ABNORMAL LOW (ref 12.0–15.0)
MCH: 30 pg (ref 26.0–34.0)
MCHC: 31.3 g/dL (ref 30.0–36.0)
MCV: 96 fL (ref 78.0–100.0)
Platelets: 230 10*3/uL (ref 150–400)
RBC: 3.5 MIL/uL — AB (ref 3.87–5.11)
RDW: 17.3 % — AB (ref 11.5–15.5)
WBC: 5.9 10*3/uL (ref 4.0–10.5)

## 2014-06-28 MED ORDER — DARBEPOETIN ALFA 500 MCG/ML IJ SOSY
PREFILLED_SYRINGE | INTRAMUSCULAR | Status: AC
  Filled 2014-06-28: qty 1

## 2014-06-28 MED ORDER — DARBEPOETIN ALFA 500 MCG/ML IJ SOSY
500.0000 ug | PREFILLED_SYRINGE | Freq: Once | INTRAMUSCULAR | Status: AC
  Administered 2014-06-28: 500 ug via SUBCUTANEOUS

## 2014-06-28 NOTE — Progress Notes (Signed)
[name] [name] presents today for injection per MD orders. Aranesp 500 mcg administered SQ in left Abdomen. Administration without incident. Patient tolerated well.

## 2014-06-28 NOTE — Patient Instructions (Signed)
[facility] Discharge Instructions  RECOMMENDATIONS MADE BY THE CONSULTANT AND ANY TEST RESULTS WILL BE SENT TO YOUR REFERRING PHYSICIAN.  MEDICATIONS PRESCRIBED:  Aranesp 500 mcg injection today for hemoglobin 10.5.  INSTRUCTIONS/FOLLOW-UP: Return as scheduled every 3 weeks for lab work and injection.  Thank you for choosing [facility] to provide your oncology and hematology care.  To afford each patient quality time with our providers, please arrive at least 15 minutes before your scheduled appointment time.  With your help, our goal is to use those 15 minutes to complete the necessary work-up to ensure our physicians have the information they need to help with your evaluation and healthcare recommendations.    Effective January 1st, 2014, we ask that you re-schedule your appointment with our physicians should you arrive 10 or more minutes late for your appointment.  We strive to give you quality time with our providers, and arriving late affects you and other patients whose appointments are after yours.    Again, thank you for choosing [facility].  Our hope is that these requests will decrease the amount of time that you wait before being seen by our physicians.       _____________________________________________________________  Should you have questions after your visit to [facility], please contact our office at [phone] between the hours of 8:30 a.m. and 4:30 p.m.  Voicemails left after 4:30 p.m. will not be returned until the following business day.  For prescription refill requests, have your pharmacy contact our office with your prescription refill request.    _______________________________________________________________  We hope that we have given you very good care.  You may receive a patient satisfaction survey in the mail, please complete it and return it as soon as possible.  We value your  feedback!  _______________________________________________________________  Have you asked about our STAR program?  STAR stands for Survivorship Training and Rehabilitation, and this is a nationally recognized cancer care program that focuses on survivorship and rehabilitation.  Cancer and cancer treatments may cause problems, such as, pain, making you feel tired and keeping you from doing the things that you need or want to do. Cancer rehabilitation can help. Our goal is to reduce these troubling effects and help you have the best quality of life possible.  You may receive a survey from a nurse that asks questions about your current state of health.  Based on the survey results, all eligible patients will be referred to the [facility] program for an evaluation so we can better serve you!  A frequently asked questions sheet is available upon request.

## 2014-06-28 NOTE — Progress Notes (Signed)
LABS FOR CBC 

## 2014-07-19 ENCOUNTER — Encounter (HOSPITAL_COMMUNITY): Payer: Medicare Other | Attending: Oncology

## 2014-07-19 ENCOUNTER — Encounter (HOSPITAL_BASED_OUTPATIENT_CLINIC_OR_DEPARTMENT_OTHER): Payer: Medicare Other

## 2014-07-19 DIAGNOSIS — D631 Anemia in chronic kidney disease: Secondary | ICD-10-CM

## 2014-07-19 DIAGNOSIS — N184 Chronic kidney disease, stage 4 (severe): Secondary | ICD-10-CM

## 2014-07-19 DIAGNOSIS — R300 Dysuria: Secondary | ICD-10-CM | POA: Insufficient documentation

## 2014-07-19 DIAGNOSIS — C8290 Follicular lymphoma, unspecified, unspecified site: Secondary | ICD-10-CM | POA: Diagnosis present

## 2014-07-19 LAB — CBC
HEMATOCRIT: 34 % — AB (ref 36.0–46.0)
Hemoglobin: 10.5 g/dL — ABNORMAL LOW (ref 12.0–15.0)
MCH: 29.6 pg (ref 26.0–34.0)
MCHC: 30.9 g/dL (ref 30.0–36.0)
MCV: 95.8 fL (ref 78.0–100.0)
PLATELETS: 226 10*3/uL (ref 150–400)
RBC: 3.55 MIL/uL — AB (ref 3.87–5.11)
RDW: 17 % — ABNORMAL HIGH (ref 11.5–15.5)
WBC: 8.3 10*3/uL (ref 4.0–10.5)

## 2014-07-19 MED ORDER — DARBEPOETIN ALFA 100 MCG/0.5ML IJ SOSY
PREFILLED_SYRINGE | INTRAMUSCULAR | Status: AC
  Filled 2014-07-19: qty 0.5

## 2014-07-19 MED ORDER — DARBEPOETIN ALFA 100 MCG/0.5ML IJ SOSY
80.0000 ug | PREFILLED_SYRINGE | Freq: Once | INTRAMUSCULAR | Status: AC
  Administered 2014-07-19: 80 ug via SUBCUTANEOUS

## 2014-07-19 NOTE — Progress Notes (Signed)
Labs for cbc 

## 2014-07-19 NOTE — Patient Instructions (Signed)
[place] at [facility] Discharge Instructions  RECOMMENDATIONS MADE BY THE CONSULTANT AND ANY TEST RESULTS WILL BE SENT TO YOUR REFERRING PHYSICIAN.  Hemoglobin 10.5 today. Aranesp 27mcg given today. Aranesp injection will now be every 2 weeks with lab work prior each time. Return as scheduled.  Thank you for choosing [place] at [facility] to provide your oncology and hematology care.  To afford each patient quality time with our provider, please arrive at least 15 minutes before your scheduled appointment time.    You need to re-schedule your appointment should you arrive 10 or more minutes late.  We strive to give you quality time with our providers, and arriving late affects you and other patients whose appointments are after yours.  Also, if you no show three or more times for appointments you may be dismissed from the clinic at the providers discretion.     Again, thank you for choosing [facility].  Our hope is that these requests will decrease the amount of time that you wait before being seen by our physicians.       _____________________________________________________________  Should you have questions after your visit to [facility], please contact our office at [phone] between the hours of 8:30 a.m. and 4:30 p.m.  Voicemails left after 4:30 p.m. will not be returned until the following business day.  For prescription refill requests, have your pharmacy contact our office.

## 2014-07-19 NOTE — Progress Notes (Signed)
[name] [name] presents today for injection per MD orders. Aranesp 52mcg administered SQ in left Abdomen. Administration without incident. Patient tolerated well.

## 2014-07-26 ENCOUNTER — Encounter (HOSPITAL_BASED_OUTPATIENT_CLINIC_OR_DEPARTMENT_OTHER): Payer: Medicare Other

## 2014-07-26 ENCOUNTER — Encounter (HOSPITAL_BASED_OUTPATIENT_CLINIC_OR_DEPARTMENT_OTHER): Payer: Medicare Other | Admitting: Hematology & Oncology

## 2014-07-26 ENCOUNTER — Encounter (HOSPITAL_COMMUNITY): Payer: Self-pay | Admitting: Hematology & Oncology

## 2014-07-26 VITALS — BP 159/59 | HR 77 | Temp 97.9°F | Resp 20 | Wt 222.0 lb

## 2014-07-26 DIAGNOSIS — N184 Chronic kidney disease, stage 4 (severe): Secondary | ICD-10-CM

## 2014-07-26 DIAGNOSIS — C8290 Follicular lymphoma, unspecified, unspecified site: Secondary | ICD-10-CM

## 2014-07-26 DIAGNOSIS — C8210 Follicular lymphoma grade II, unspecified site: Secondary | ICD-10-CM

## 2014-07-26 DIAGNOSIS — R300 Dysuria: Secondary | ICD-10-CM | POA: Diagnosis not present

## 2014-07-26 DIAGNOSIS — D649 Anemia, unspecified: Secondary | ICD-10-CM

## 2014-07-26 DIAGNOSIS — D631 Anemia in chronic kidney disease: Secondary | ICD-10-CM

## 2014-07-26 DIAGNOSIS — N189 Chronic kidney disease, unspecified: Secondary | ICD-10-CM

## 2014-07-26 DIAGNOSIS — Z5112 Encounter for antineoplastic immunotherapy: Secondary | ICD-10-CM

## 2014-07-26 DIAGNOSIS — N183 Chronic kidney disease, stage 3 (moderate): Secondary | ICD-10-CM

## 2014-07-26 LAB — URINALYSIS, ROUTINE W REFLEX MICROSCOPIC
Bilirubin Urine: NEGATIVE
Glucose, UA: 500 mg/dL — AB
HGB URINE DIPSTICK: NEGATIVE
KETONES UR: NEGATIVE mg/dL
Leukocytes, UA: NEGATIVE
NITRITE: NEGATIVE
PH: 5.5 (ref 5.0–8.0)
Protein, ur: NEGATIVE mg/dL
SPECIFIC GRAVITY, URINE: 1.01 (ref 1.005–1.030)
UROBILINOGEN UA: 0.2 mg/dL (ref 0.0–1.0)

## 2014-07-26 LAB — CBC WITH DIFFERENTIAL/PLATELET
BASOS PCT: 1 % (ref 0–1)
Basophils Absolute: 0 10*3/uL (ref 0.0–0.1)
Eosinophils Absolute: 0.1 10*3/uL (ref 0.0–0.7)
Eosinophils Relative: 2 % (ref 0–5)
HEMATOCRIT: 30.5 % — AB (ref 36.0–46.0)
HEMOGLOBIN: 9.7 g/dL — AB (ref 12.0–15.0)
LYMPHS ABS: 0.6 10*3/uL — AB (ref 0.7–4.0)
Lymphocytes Relative: 9 % — ABNORMAL LOW (ref 12–46)
MCH: 30.6 pg (ref 26.0–34.0)
MCHC: 31.8 g/dL (ref 30.0–36.0)
MCV: 96.2 fL (ref 78.0–100.0)
Monocytes Absolute: 0.4 10*3/uL (ref 0.1–1.0)
Monocytes Relative: 7 % (ref 3–12)
Neutro Abs: 5.4 10*3/uL (ref 1.7–7.7)
Neutrophils Relative %: 81 % — ABNORMAL HIGH (ref 43–77)
Platelets: 219 10*3/uL (ref 150–400)
RBC: 3.17 MIL/uL — ABNORMAL LOW (ref 3.87–5.11)
RDW: 17.1 % — ABNORMAL HIGH (ref 11.5–15.5)
WBC: 6.6 10*3/uL (ref 4.0–10.5)

## 2014-07-26 LAB — COMPREHENSIVE METABOLIC PANEL
ALK PHOS: 101 U/L (ref 39–117)
ALT: 18 U/L (ref 0–35)
AST: 18 U/L (ref 0–37)
Albumin: 3.4 g/dL — ABNORMAL LOW (ref 3.5–5.2)
Anion gap: 8 (ref 5–15)
BUN: 29 mg/dL — ABNORMAL HIGH (ref 6–23)
CHLORIDE: 104 meq/L (ref 96–112)
CO2: 25 mmol/L (ref 19–32)
CREATININE: 2.03 mg/dL — AB (ref 0.50–1.10)
Calcium: 8.9 mg/dL (ref 8.4–10.5)
GFR calc Af Amer: 27 mL/min — ABNORMAL LOW (ref >90)
GFR calc non Af Amer: 23 mL/min — ABNORMAL LOW (ref >90)
GLUCOSE: 329 mg/dL — AB (ref 70–99)
POTASSIUM: 4.5 mmol/L (ref 3.5–5.1)
SODIUM: 137 mmol/L (ref 135–145)
TOTAL PROTEIN: 5.9 g/dL — AB (ref 6.0–8.3)
Total Bilirubin: 0.3 mg/dL (ref 0.3–1.2)

## 2014-07-26 LAB — RETICULOCYTES
RBC.: 3.17 MIL/uL — AB (ref 3.87–5.11)
RETIC COUNT ABSOLUTE: 47.6 10*3/uL (ref 19.0–186.0)
Retic Ct Pct: 1.5 % (ref 0.4–3.1)

## 2014-07-26 LAB — LACTATE DEHYDROGENASE: LDH: 212 U/L (ref 94–250)

## 2014-07-26 MED ORDER — DIPHENHYDRAMINE HCL 25 MG PO CAPS
50.0000 mg | ORAL_CAPSULE | Freq: Once | ORAL | Status: AC
  Administered 2014-07-26: 50 mg via ORAL
  Filled 2014-07-26: qty 2

## 2014-07-26 MED ORDER — HEPARIN SOD (PORK) LOCK FLUSH 100 UNIT/ML IV SOLN
INTRAVENOUS | Status: AC
  Filled 2014-07-26: qty 5

## 2014-07-26 MED ORDER — SODIUM CHLORIDE 0.9 % IV SOLN
375.0000 mg/m2 | Freq: Once | INTRAVENOUS | Status: AC
  Administered 2014-07-26: 800 mg via INTRAVENOUS
  Filled 2014-07-26: qty 80

## 2014-07-26 MED ORDER — SODIUM CHLORIDE 0.9 % IV SOLN
INTRAVENOUS | Status: DC
  Administered 2014-07-26: 09:00:00 via INTRAVENOUS

## 2014-07-26 MED ORDER — SODIUM CHLORIDE 0.9 % IJ SOLN: 10.0000 mL | INTRAMUSCULAR | Status: DC | PRN

## 2014-07-26 MED ORDER — HEPARIN SOD (PORK) LOCK FLUSH 100 UNIT/ML IV SOLN
500.0000 [IU] | Freq: Once | INTRAVENOUS | Status: AC | PRN
  Administered 2014-07-26: 500 [IU]

## 2014-07-26 NOTE — Patient Instructions (Signed)
[place] at [facility]  Discharge Instructions:  Please call with any problems or concerns prior to follow-up We will see you back in 2 months with labs and treatment  _______________________________________________________________  Thank you for choosing [place] at [facility] to provide your oncology and hematology care.  To afford each patient quality time with our providers, please arrive at least 15 minutes before your scheduled appointment.  You need to re-schedule your appointment if you arrive 10 or more minutes late.  We strive to give you quality time with our providers, and arriving late affects you and other patients whose appointments are after yours.  Also, if you no show three or more times for appointments you may be dismissed from the clinic.  Again, thank you for choosing [place] at [facility] hope is that these requests will allow you access to exceptional care and in a timely manner. _______________________________________________________________  If you have questions after your visit, please contact our office at [phone] between the hours of 8:30 a.m. and 5:00 p.m. Voicemails left after 4:30 p.m. will not be returned until the following business day. _______________________________________________________________  For prescription refill requests, have your pharmacy contact our office. _______________________________________________________________  Recommendations made by the consultant and any test results will be sent to your referring physician. _______________________________________________________________

## 2014-07-26 NOTE — Progress Notes (Signed)
Labs for cbcd,cmp,b1m,ldh,retic,ua

## 2014-07-26 NOTE — Progress Notes (Signed)
[name] [name] Tolerated chemotherapy well today, discharged ambulatory

## 2014-07-26 NOTE — Patient Instructions (Signed)
[facility] Discharge Instructions for Patients Receiving Chemotherapy  Today you received the following chemotherapy agents rituxan Follow up as scheduled, call the clinic if you have any questions or concerns  To help prevent nausea and vomiting after your treatment, we encourage you to take your nausea medication    If you develop nausea and vomiting that is not controlled by your nausea medication, call the clinic. If it is after clinic hours your family physician or the after hours number for the clinic or go to the Emergency Department.   BELOW ARE SYMPTOMS THAT SHOULD BE REPORTED IMMEDIATELY:  *FEVER GREATER THAN 101.0 F  *CHILLS WITH OR WITHOUT FEVER  NAUSEA AND VOMITING THAT IS NOT CONTROLLED WITH YOUR NAUSEA MEDICATION  *UNUSUAL SHORTNESS OF BREATH  *UNUSUAL BRUISING OR BLEEDING  TENDERNESS IN MOUTH AND THROAT WITH OR WITHOUT PRESENCE OF ULCERS  *URINARY PROBLEMS  *BOWEL PROBLEMS  UNUSUAL RASH Items with * indicate a potential emergency and should be followed up as soon as possible.  One of the nurses will contact you 24 hours after your treatment. Please let the nurse know about any problems that you may have experienced. Feel free to call the clinic you have any questions or concerns. The clinic phone number is [phone].   I have been informed and understand all the instructions given to me. I know to contact the clinic, my physician, or go to the Emergency Department if any problems should occur. I do not have any questions at this time, but understand that I may call the clinic during office hours or the Patient Navigator at [phone] should I have any questions or need assistance in obtaining follow up care.   Rituximab injection What is this medicine? RITUXIMAB (ri TUX i mab) is a monoclonal antibody. This medicine changes the way the body's immune system works. It is used commonly to treat non-Hodgkin's lymphoma and other conditions. In  cancer cells, this drug targets a specific protein within cancer cells and stops the cancer cells from growing. It is also used to treat rhuematoid arthritis (RA). In RA, this medicine slow the inflammatory process and help reduce joint pain and swelling. This medicine is often used with other cancer or arthritis medications. This medicine may be used for other purposes; ask your health care provider or pharmacist if you have questions. COMMON BRAND NAME(S): Rituxan What should I tell my health care provider before I take this medicine? They need to know if you have any of these conditions: -blood disorders -heart disease -history of hepatitis B -infection (especially a virus infection such as chickenpox, cold sores, or herpes) -irregular heartbeat -kidney disease -lung or breathing disease, like asthma -lupus -an unusual or allergic reaction to rituximab, mouse proteins, other medicines, foods, dyes, or preservatives -pregnant or trying to get pregnant -breast-feeding How should I use this medicine? This medicine is for infusion into a vein. It is administered in a hospital or clinic by a specially trained health care professional. A special MedGuide will be given to you by the pharmacist with each prescription and refill. Be sure to read this information carefully each time. Talk to your pediatrician regarding the use of this medicine in children. This medicine is not approved for use in children. Overdosage: If you think you have taken too much of this medicine contact a poison control center or emergency room at once. NOTE: This medicine is only for you. Do not share this medicine with others. What if I  miss a dose? It is important not to miss a dose. Call your doctor or health care professional if you are unable to keep an appointment. What may interact with this medicine? -cisplatin -medicines for blood pressure -some other medicines for arthritis -vaccines This list may not  describe all possible interactions. Give your health care provider a list of all the medicines, herbs, non-prescription drugs, or dietary supplements you use. Also tell them if you smoke, drink alcohol, or use illegal drugs. Some items may interact with your medicine. What should I watch for while using this medicine? Report any side effects that you notice during your treatment right away, such as changes in your breathing, fever, chills, dizziness or lightheadedness. These effects are more common with the first dose. Visit your prescriber or health care professional for checks on your progress. You will need to have regular blood work. Report any other side effects. The side effects of this medicine can continue after you finish your treatment. Continue your course of treatment even though you feel ill unless your doctor tells you to stop. Call your doctor or health care professional for advice if you get a fever, chills or sore throat, or other symptoms of a cold or flu. Do not treat yourself. This drug decreases your body's ability to fight infections. Try to avoid being around people who are sick. This medicine may increase your risk to bruise or bleed. Call your doctor or health care professional if you notice any unusual bleeding. Be careful brushing and flossing your teeth or using a toothpick because you may get an infection or bleed more easily. If you have any dental work done, tell your dentist you are receiving this medicine. Avoid taking products that contain aspirin, acetaminophen, ibuprofen, naproxen, or ketoprofen unless instructed by your doctor. These medicines may hide a fever. Do not become pregnant while taking this medicine. Women should inform their doctor if they wish to become pregnant or think they might be pregnant. There is a potential for serious side effects to an unborn child. Talk to your health care professional or pharmacist for more information. Do not breast-feed an  infant while taking this medicine. What side effects may I notice from receiving this medicine? Side effects that you should report to your doctor or health care professional as soon as possible: -allergic reactions like skin rash, itching or hives, swelling of the face, lips, or tongue -low blood counts - this medicine may decrease the number of white blood cells, red blood cells and platelets. You may be at increased risk for infections and bleeding. -signs of infection - fever or chills, cough, sore throat, pain or difficulty passing urine -signs of decreased platelets or bleeding - bruising, pinpoint red spots on the skin, black, tarry stools, blood in the urine -signs of decreased red blood cells - unusually weak or tired, fainting spells, lightheadedness -breathing problems -confused, not responsive -chest pain -fast, irregular heartbeat -feeling faint or lightheaded, falls -mouth sores -redness, blistering, peeling or loosening of the skin, including inside the mouth -stomach pain -swelling of the ankles, feet, or hands -trouble passing urine or change in the amount of urine Side effects that usually do not require medical attention (report to your doctor or other health care professional if they continue or are bothersome): -anxiety -headache -loss of appetite -muscle aches -nausea -night sweats This list may not describe all possible side effects. Call your doctor for medical advice about side effects. You may report side effects to  FDA at 1-800-FDA-1088. Where should I keep my medicine? This drug is given in a hospital or clinic and will not be stored at home. NOTE: This sheet is a summary. It may not cover all possible information. If you have questions about this medicine, talk to your doctor, pharmacist, or health care provider.  2015, Elsevier/Gold Standard. (2008-02-26 14:04:59)

## 2014-07-26 NOTE — Progress Notes (Signed)
NYLAND,LEONARD ROBERT, MD [address] [place] [address]  Follicular Lymphoma, treated with 6 cycles of treanda/rituxan. 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 female returns for follow-up of her follicular lymphoma.  She is eating well. She has some fatigue.  She has occasional back pain.  She does rest periodically through the day.  She denies night sweats, fever or chills.  She complains of urinary burning today.   MEDICAL HISTORY: Past Medical History  Diagnosis Date  . Hypertension   . Diabetes mellitus, type 2   . Hyperlipidemia   . Iron deficiency anemia 11/21/2012    At time of presentation.  Feraheme 1020 mg on 11/21/12.  . Follicular lymphoma 7/32/2025    Right ureteral obstruction by lymphadenopathy-> right hydronephrosis  . Port catheter in place 42/70/6237    has Follicular lymphoma; Iron deficiency anemia; Chronic kidney disease (CKD), stage IV (severe); Diabetes mellitus type 2 in obese; Hypertension; UTI (urinary tract infection); Leukocytosis, unspecified; Hypoglycemia; Port catheter in place; CKD (chronic kidney disease); Anemia; and Dysuria on her problem list.     No history exists.     has No Known Allergies.  Ms. Branan had no medications administered during this visit.  SURGICAL HISTORY: Past Surgical History  Procedure Laterality Date  . Cataract extraction    . Cystoscopy w/ ureteral stent placement Right 11/03/2012    Procedure: CYSTOSCOPY WITH RETROGRADE PYELOGRAM/URETERAL STENT PLACEMENT;  Surgeon: Malka So, MD;  Location: [facility];  Service: Urology;  Laterality: Right;  . Portacath placement Left 11/03/2012    Procedure: INSERTION  PORT-A-CATH;  Surgeon: Scherry Ran, MD;  Location: [facility];  Service: General;  Laterality: Left;  Insertion Port-A-Cath Left Subclavian  . Appendectomy    . Cystoscopy w/ ureteral stent placement Right 03/23/2013    Procedure: CYSTOSCOPY WITH STENT REPLACEMENT;  Surgeon: Malka So, MD;  Location: [facility];  Service: Urology;  Laterality: Right;    SOCIAL HISTORY: History   Social History  . Marital Status: Married    Spouse Name: N/A    Number of Children: N/A  . Years of Education: N/A   Occupational History  . Not on file.   Social History Main Topics  . Smoking status: Never Smoker   . Smokeless tobacco: Never Used  . Alcohol Use: No  . Drug Use: No  . Sexual Activity: No   Other Topics Concern  . Not on file   Social History Narrative    FAMILY HISTORY: Family History  Problem Relation Age of Onset  . Cancer Father   . Cancer Brother     Review of Systems  Constitutional: Positive for malaise/fatigue. Negative for fever, chills and weight loss.  HENT: Negative for congestion, hearing loss, nosebleeds, sore throat and tinnitus.   Eyes: Negative for blurred vision, double vision, pain and discharge.  Respiratory: Negative for cough, hemoptysis, sputum production, shortness of breath and wheezing.   Cardiovascular: Negative for chest pain, palpitations, claudication, leg swelling and PND.  Gastrointestinal: Negative for heartburn, nausea, vomiting, abdominal pain, diarrhea, constipation, blood in stool and melena.  Genitourinary: Positive for dysuria and urgency. Negative for frequency and hematuria.  Musculoskeletal: Positive for myalgias and back pain. Negative for joint pain and falls.  Skin: Negative for itching and rash.  Neurological: Negative for dizziness, tingling, tremors, sensory change, speech change, focal weakness, seizures, loss of consciousness, weakness and headaches.  Endo/Heme/Allergies: Does not bruise/bleed easily.    Psychiatric/Behavioral: Negative for depression, suicidal ideas, memory loss and substance abuse. The patient is not nervous/anxious and does not have insomnia.     PHYSICAL EXAMINATION  ECOG PERFORMANCE STATUS: 1 - Symptomatic but completely ambulatory  Filed Vitals:   07/26/14 0856  BP: 159/59  Pulse: 77  Temp: 97.9 F (36.6 C)  Resp: 20    Physical Exam  Constitutional: She is oriented to person, place, and time and well-developed, well-nourished, and in no distress.  Obese  HENT:  Head: Normocephalic and atraumatic.  Nose: Nose normal.  Mouth/Throat: Oropharynx is clear and moist. No oropharyngeal exudate.  Eyes: Conjunctivae and EOM are normal. Pupils are equal, round, and reactive to light. Right eye exhibits no discharge. Left eye exhibits no discharge. No scleral icterus.  Neck: Normal range of motion. Neck supple. No tracheal deviation present. No thyromegaly present.  Cardiovascular: Normal rate, regular rhythm and normal heart sounds.  Exam reveals no gallop and no friction rub.   No murmur heard. Pulmonary/Chest: Effort normal and breath sounds normal. She has no wheezes. She has no rales.  Abdominal: Soft. Bowel sounds are normal. She exhibits no distension and no mass. There is no tenderness. There is no rebound and no guarding.  Musculoskeletal: Normal range of motion. She exhibits no edema.  Lymphadenopathy:    She has no cervical adenopathy.  Neurological: She is alert and oriented to person, place, and time. She has normal reflexes. No cranial nerve deficit. Gait normal. Coordination normal.  Skin: Skin is warm and dry. No rash noted.  Psychiatric: Mood, memory, affect and judgment normal.  Nursing note and vitals reviewed.   LABORATORY DATA:  CBC    Component Value Date/Time   WBC 6.6 07/26/2014 0956   RBC 3.17* 07/26/2014 0956   RBC 3.17* 07/26/2014 0956   HGB 9.7* 07/26/2014 0956   HCT 30.5* 07/26/2014 0956   PLT 219 07/26/2014 0956   MCV 96.2  07/26/2014 0956   MCH 30.6 07/26/2014 0956   MCHC 31.8 07/26/2014 0956   RDW 17.1* 07/26/2014 0956   LYMPHSABS 0.6* 07/26/2014 0956   MONOABS 0.4 07/26/2014 0956   EOSABS 0.1 07/26/2014 0956   BASOSABS 0.0 07/26/2014 0956   CMP     Component Value Date/Time   NA 137 07/26/2014 0956   K 4.5 07/26/2014 0956   CL 104 07/26/2014 0956   CO2 25 07/26/2014 0956   GLUCOSE 329* 07/26/2014 0956   BUN 29* 07/26/2014 0956   CREATININE 2.03* 07/26/2014 0956   CALCIUM 8.9 07/26/2014 0956   PROT 5.9* 07/26/2014 0956   ALBUMIN 3.4* 07/26/2014 0956   AST 18 07/26/2014 0956   ALT 18 07/26/2014 0956   ALKPHOS 101 07/26/2014 0956   BILITOT 0.3 07/26/2014 0956   GFRNONAA 23* 07/26/2014 0956   GFRAA 27* 07/26/2014 0956     ASSESSMENT and THERAPY PLAN:    Follicular lymphoma Pleasant 74 year old female with stage II high-grade follicular lymphoma. At presentation she was symptomatic with pain and obstruction of the right ureter. She was treated with Treanda and Rituxan, first cycle given on 11/28/2012. She is currently on maintenance Rituxan. From the perspective of her lymphoma she seems to be doing well. She does her ADLs and states she has some fatigue and rests intermittently during the day. He denies  any B symptoms. 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 New disease or evidence of progression was seen. We will continue on maintenance Rituxan and see her back prior to her next dose in 2 months.   Anemia 74 year old female with anemia, multifactorial. She has stage IV chronic kidney disease which is certainly a strong contributor. She is also on Rituxan therapy and has been treated with Treanda and Rituxan in the recent past. I have changed her Aranesp to renal dosing. We will follow her iron levels to ensure a serum ferritin of greater than or equal to 100. If her anemia worsens we can increase her Aranesp and/ or consider additional  workup of her anemia.   Dysuria He is complaining of mild dysuria today we will check a UA and based upon the results will recommend additional therapy or observation. I advised her if her urine study is negative today but her symptoms worsen to let us know and we will call her in an antibiotic.     All questions were answered. The patient knows to call the clinic with any problems, questions or concerns. We can certainly see the patient much sooner if necessary.   Molli Hazard 07/30/2014

## 2014-07-29 LAB — BETA 2 MICROGLOBULIN, SERUM: BETA 2 MICROGLOBULIN: 7.48 mg/L — AB (ref <=2.51)

## 2014-07-30 DIAGNOSIS — N184 Chronic kidney disease, stage 4 (severe): Secondary | ICD-10-CM

## 2014-07-30 DIAGNOSIS — D631 Anemia in chronic kidney disease: Secondary | ICD-10-CM | POA: Insufficient documentation

## 2014-07-30 DIAGNOSIS — R300 Dysuria: Secondary | ICD-10-CM | POA: Insufficient documentation

## 2014-07-30 DIAGNOSIS — N189 Chronic kidney disease, unspecified: Secondary | ICD-10-CM | POA: Insufficient documentation

## 2014-07-30 HISTORY — DX: Anemia in chronic kidney disease: D63.1

## 2014-07-30 HISTORY — DX: Chronic kidney disease, stage 4 (severe): N18.4

## 2014-07-30 NOTE — Assessment & Plan Note (Signed)
He is complaining of mild dysuria today we will check a UA and based upon the results will recommend additional therapy or observation. I advised her if her urine study is negative today but her symptoms worsen to let us know and we will call her in an antibiotic.

## 2014-07-30 NOTE — Assessment & Plan Note (Signed)
74 year old female with anemia, multifactorial. She has stage IV chronic kidney disease which is certainly a strong contributor. She is also on Rituxan therapy and has been treated with Treanda and Rituxan in the recent past. I have changed her Aranesp to renal dosing. We will follow her iron levels to ensure a serum ferritin of greater than or equal to 100. If her anemia worsens we can increase her Aranesp and/ or consider additional workup of her anemia.

## 2014-07-30 NOTE — Assessment & Plan Note (Signed)
Pleasant 74 year old female with stage II high-grade follicular lymphoma. At presentation she was symptomatic with pain and obstruction of the right ureter. She was treated with Treanda and Rituxan, first cycle given on 11/28/2012. She is currently on maintenance Rituxan. From the perspective of her lymphoma she seems to be doing well. She does her ADLs and states she has some fatigue and rests intermittently during the day. He denies any B symptoms. 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 New disease or evidence of progression was seen. We will continue on maintenance Rituxan and see her back prior to her next dose in 2 months.

## 2014-08-02 ENCOUNTER — Encounter (HOSPITAL_COMMUNITY): Payer: Medicare Other

## 2014-08-06 ENCOUNTER — Encounter (HOSPITAL_BASED_OUTPATIENT_CLINIC_OR_DEPARTMENT_OTHER): Payer: Medicare Other

## 2014-08-06 ENCOUNTER — Encounter (HOSPITAL_COMMUNITY): Payer: Medicare Other

## 2014-08-06 DIAGNOSIS — R300 Dysuria: Secondary | ICD-10-CM | POA: Diagnosis not present

## 2014-08-06 DIAGNOSIS — N184 Chronic kidney disease, stage 4 (severe): Secondary | ICD-10-CM

## 2014-08-06 DIAGNOSIS — D631 Anemia in chronic kidney disease: Secondary | ICD-10-CM

## 2014-08-06 LAB — CBC
HCT: 31.9 % — ABNORMAL LOW (ref 36.0–46.0)
HEMOGLOBIN: 9.9 g/dL — AB (ref 12.0–15.0)
MCH: 29.9 pg (ref 26.0–34.0)
MCHC: 31 g/dL (ref 30.0–36.0)
MCV: 96.4 fL (ref 78.0–100.0)
PLATELETS: 230 10*3/uL (ref 150–400)
RBC: 3.31 MIL/uL — AB (ref 3.87–5.11)
RDW: 17.2 % — AB (ref 11.5–15.5)
WBC: 7.8 10*3/uL (ref 4.0–10.5)

## 2014-08-06 MED ORDER — DARBEPOETIN ALFA 100 MCG/0.5ML IJ SOSY
PREFILLED_SYRINGE | INTRAMUSCULAR | Status: AC
  Filled 2014-08-06: qty 0.5

## 2014-08-06 MED ORDER — DARBEPOETIN ALFA 100 MCG/0.5ML IJ SOSY
80.0000 ug | PREFILLED_SYRINGE | Freq: Once | INTRAMUSCULAR | Status: AC
  Administered 2014-08-06: 80 ug via SUBCUTANEOUS

## 2014-08-06 NOTE — Progress Notes (Signed)
[name] [name] presents today for injection per MD orders. Aranesp 80 mcg administered SQ in left Abdomen. Administration without incident. Patient tolerated well.

## 2014-08-06 NOTE — Patient Instructions (Signed)
[place] at [facility] Discharge Instructions  RECOMMENDATIONS MADE BY THE CONSULTANT AND ANY TEST RESULTS WILL BE SENT TO YOUR REFERRING PHYSICIAN.  Hemoglobin 9.9 today. Aranesp injection given as ordered. Return as scheduled.  Thank you for choosing [place] at [facility] to provide your oncology and hematology care.  To afford each patient quality time with our provider, please arrive at least 15 minutes before your scheduled appointment time.    You need to re-schedule your appointment should you arrive 10 or more minutes late.  We strive to give you quality time with our providers, and arriving late affects you and other patients whose appointments are after yours.  Also, if you no show three or more times for appointments you may be dismissed from the clinic at the providers discretion.     Again, thank you for choosing [facility].  Our hope is that these requests will decrease the amount of time that you wait before being seen by our physicians.       _____________________________________________________________  Should you have questions after your visit to [facility], please contact our office at [phone] between the hours of 8:30 a.m. and 4:30 p.m.  Voicemails left after 4:30 p.m. will not be returned until the following business day.  For prescription refill requests, have your pharmacy contact our office.

## 2014-08-16 ENCOUNTER — Encounter (HOSPITAL_COMMUNITY): Payer: Medicare Other | Attending: Hematology & Oncology

## 2014-08-16 ENCOUNTER — Encounter (HOSPITAL_COMMUNITY): Payer: Medicare Other | Attending: Hematology and Oncology

## 2014-08-16 DIAGNOSIS — N184 Chronic kidney disease, stage 4 (severe): Secondary | ICD-10-CM

## 2014-08-16 DIAGNOSIS — D631 Anemia in chronic kidney disease: Secondary | ICD-10-CM

## 2014-08-16 LAB — CBC
HCT: 32.6 % — ABNORMAL LOW (ref 36.0–46.0)
Hemoglobin: 10.2 g/dL — ABNORMAL LOW (ref 12.0–15.0)
MCH: 30.1 pg (ref 26.0–34.0)
MCHC: 31.3 g/dL (ref 30.0–36.0)
MCV: 96.2 fL (ref 78.0–100.0)
PLATELETS: 220 10*3/uL (ref 150–400)
RBC: 3.39 MIL/uL — ABNORMAL LOW (ref 3.87–5.11)
RDW: 17.5 % — AB (ref 11.5–15.5)
WBC: 8.5 10*3/uL (ref 4.0–10.5)

## 2014-08-16 MED ORDER — DARBEPOETIN ALFA 100 MCG/0.5ML IJ SOSY
PREFILLED_SYRINGE | INTRAMUSCULAR | Status: AC
  Filled 2014-08-16: qty 0.5

## 2014-08-16 MED ORDER — DARBEPOETIN ALFA 100 MCG/0.5ML IJ SOSY
80.0000 ug | PREFILLED_SYRINGE | Freq: Once | INTRAMUSCULAR | Status: AC
  Administered 2014-08-16: 80 ug via SUBCUTANEOUS

## 2014-08-16 NOTE — Patient Instructions (Signed)
 [place] at [facility] Discharge Instructions  RECOMMENDATIONS MADE BY THE CONSULTANT AND ANY TEST RESULTS WILL BE SENT TO YOUR REFERRING PHYSICIAN.  Hemoglobin 10.2 today. Aranesp given as ordered. Return as scheduled.  Thank you for choosing [place] at [facility] to provide your oncology and hematology care.  To afford each patient quality time with our provider, please arrive at least 15 minutes before your scheduled appointment time.    You need to re-schedule your appointment should you arrive 10 or more minutes late.  We strive to give you quality time with our providers, and arriving late affects you and other patients whose appointments are after yours.  Also, if you no show three or more times for appointments you may be dismissed from the clinic at the providers discretion.     Again, thank you for choosing [facility]  Our hope is that these requests will decrease the amount of time that you wait before being seen by our physicians.       _____________________________________________________________  Should you have questions after your visit to [facility], please contact our office at [phone] between the hours of 8:30 a.m. and 4:30 p.m.  Voicemails left after 4:30 p.m. will not be returned until the following business day.  For prescription refill requests, have your pharmacy contact our office.

## 2014-08-16 NOTE — Progress Notes (Signed)
Labs for cbc 

## 2014-08-16 NOTE — Progress Notes (Signed)
[name] [name] presents today for injection per MD orders. Aranesp 80 mcg administered SQ in left Abdomen. Administration without incident. Patient tolerated well.

## 2014-08-30 ENCOUNTER — Encounter (HOSPITAL_COMMUNITY): Payer: Medicare Other | Attending: Hematology & Oncology

## 2014-08-30 DIAGNOSIS — N184 Chronic kidney disease, stage 4 (severe): Secondary | ICD-10-CM | POA: Diagnosis present

## 2014-08-30 DIAGNOSIS — D631 Anemia in chronic kidney disease: Secondary | ICD-10-CM

## 2014-08-30 LAB — CBC
HEMATOCRIT: 33.3 % — AB (ref 36.0–46.0)
Hemoglobin: 10.5 g/dL — ABNORMAL LOW (ref 12.0–15.0)
MCH: 30.5 pg (ref 26.0–34.0)
MCHC: 31.5 g/dL (ref 30.0–36.0)
MCV: 96.8 fL (ref 78.0–100.0)
Platelets: 226 10*3/uL (ref 150–400)
RBC: 3.44 MIL/uL — ABNORMAL LOW (ref 3.87–5.11)
RDW: 17.4 % — ABNORMAL HIGH (ref 11.5–15.5)
WBC: 8.7 10*3/uL (ref 4.0–10.5)

## 2014-08-30 MED ORDER — DARBEPOETIN ALFA 100 MCG/0.5ML IJ SOSY
PREFILLED_SYRINGE | INTRAMUSCULAR | Status: AC
  Filled 2014-08-30: qty 0.5

## 2014-08-30 MED ORDER — DARBEPOETIN ALFA 100 MCG/0.5ML IJ SOSY
80.0000 ug | PREFILLED_SYRINGE | Freq: Once | INTRAMUSCULAR | Status: AC
  Administered 2014-08-30: 80 ug via SUBCUTANEOUS

## 2014-08-30 NOTE — Progress Notes (Signed)
[name] [name]'s reason for visit today is for labs as scheduled per MD orders.  Venipuncture performed with a 23 gauge butterfly needle to L Antecubital.  [name] [name] tolerated procedure well and without incident; questions were answered and patient was discharged.

## 2014-08-30 NOTE — Progress Notes (Signed)
LABS FOR CBC DRAWN BY RN

## 2014-08-30 NOTE — Patient Instructions (Signed)
[place] at [facility] Discharge Instructions  RECOMMENDATIONS MADE BY THE CONSULTANT AND ANY TEST RESULTS WILL BE SENT TO YOUR REFERRING PHYSICIAN.  Today you received Aranesp (hemoglobin was 10.5) Return as scheduled for lab work, injections, office visits, and treatments.  Thank you for choosing [place] at [facility] to provide your oncology and hematology care.  To afford each patient quality time with our provider, please arrive at least 15 minutes before your scheduled appointment time.    You need to re-schedule your appointment should you arrive 10 or more minutes late.  We strive to give you quality time with our providers, and arriving late affects you and other patients whose appointments are after yours.  Also, if you no show three or more times for appointments you may be dismissed from the clinic at the providers discretion.     Again, thank you for choosing [facility].  Our hope is that these requests will decrease the amount of time that you wait before being seen by our physicians.       _____________________________________________________________  Should you have questions after your visit to [facility], please contact our office at [phone] between the hours of 8:30 a.m. and 4:30 p.m.  Voicemails left after 4:30 p.m. will not be returned until the following business day.  For prescription refill requests, have your pharmacy contact our office.

## 2014-08-30 NOTE — Progress Notes (Signed)
1149:  [name] [name] presents today for injection per the provider's orders.  Aranesp administration without incident; see MAR for injection details.  Patient tolerated procedure well and without incident.  No questions or complaints noted at this time.

## 2014-09-02 IMAGING — US US RENAL
1 series · 14 of 25 positions shown · non-contrast
Comparison: 08/15/2012

CLINICAL DATA: Renal insufficiency

RENAL / URINARY TRACT ULTRASOUND
TECHNIQUE: Complete ultrasound exam of the kidneys and urinary
bladder was performed.

[Series 1: us renal · 0.27mm/px · 14 of 34 slices shown]
[im 1/34]
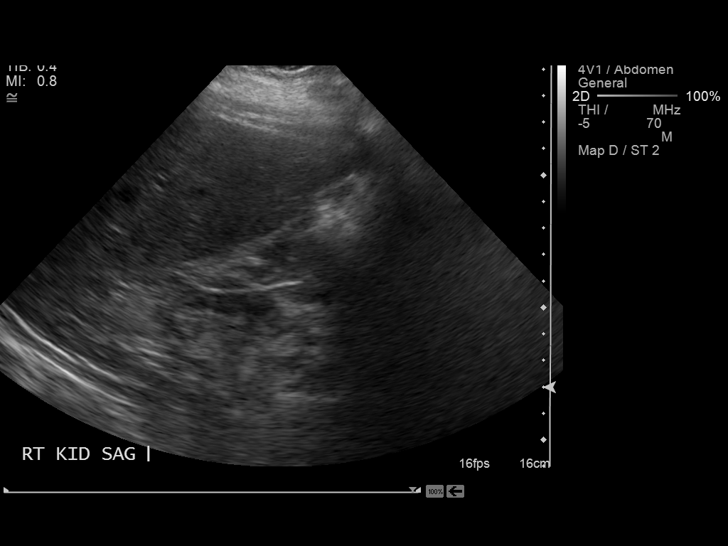
[im 3/34]
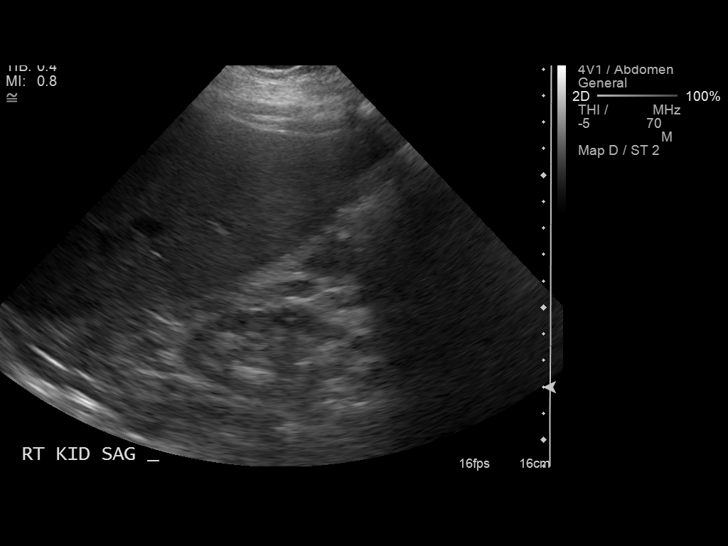
[im 6/34]
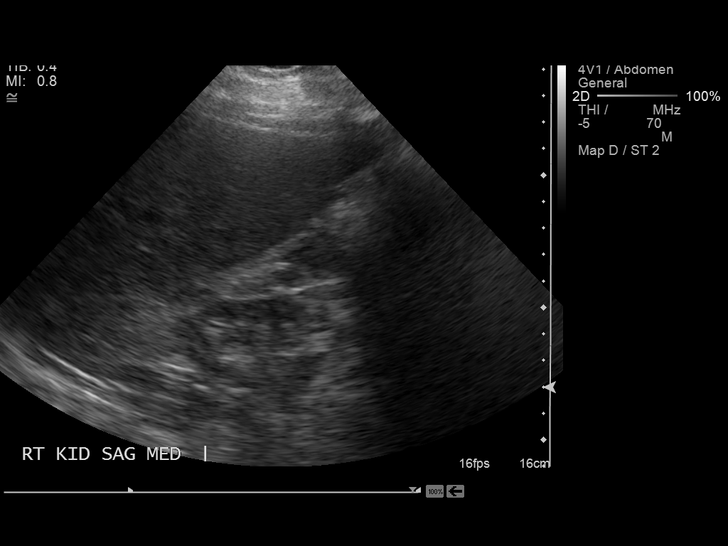
[im 9/34]
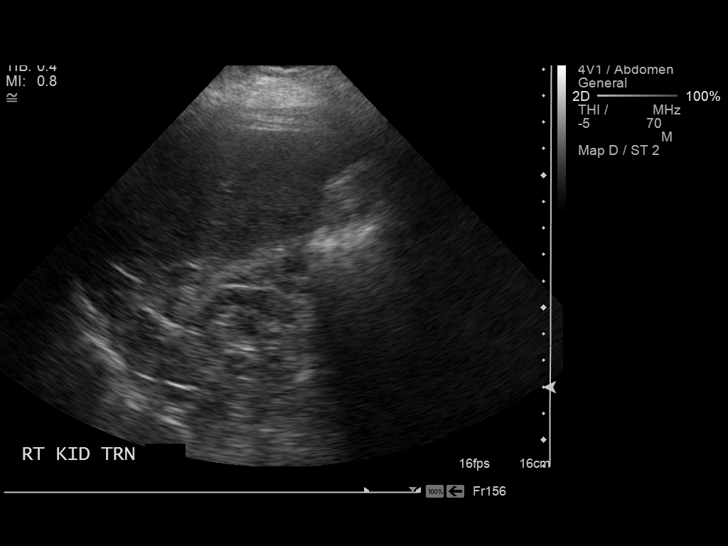
[im 12/34]
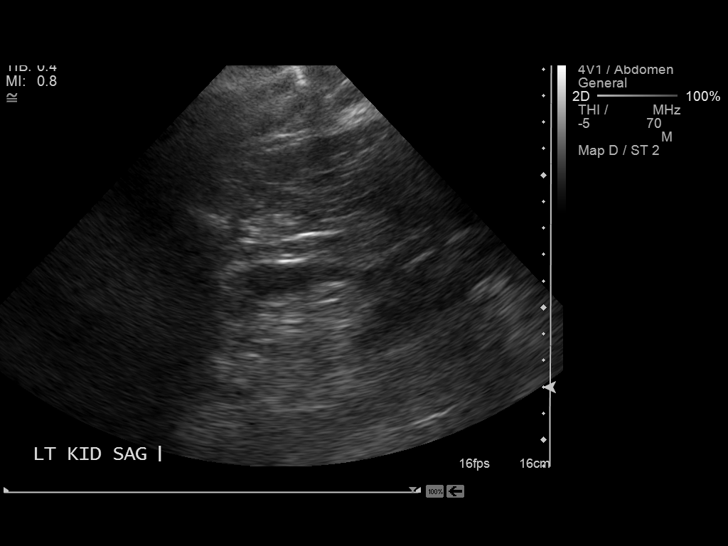
[im 13/34]
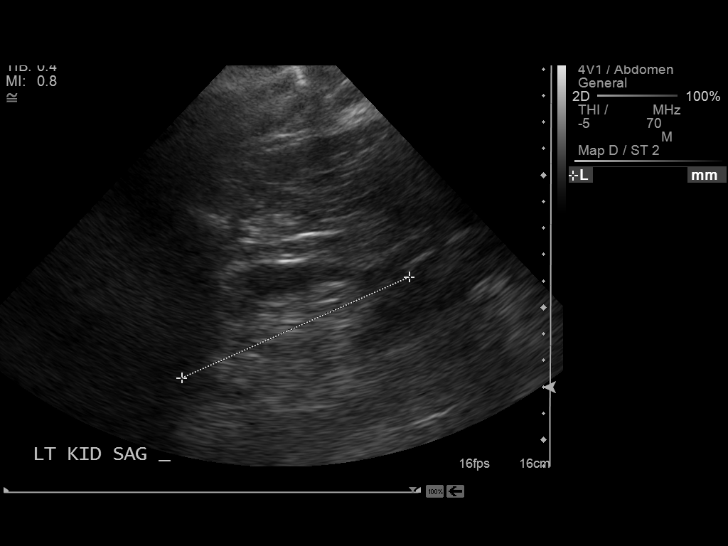
[im 16/34]
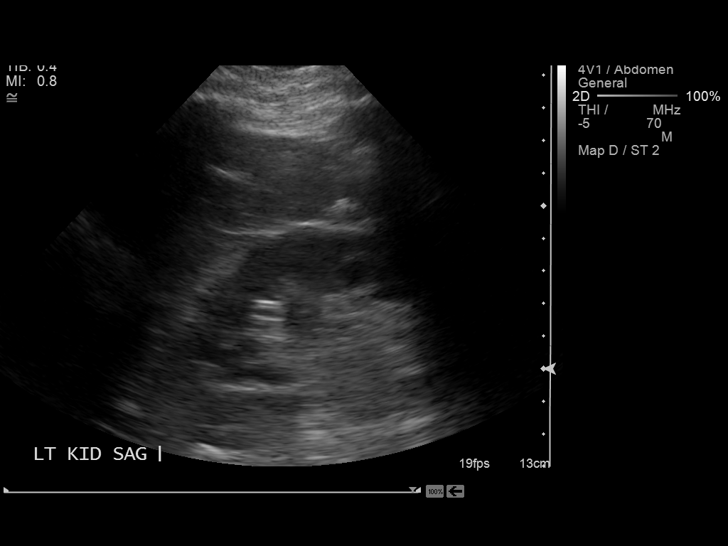
[im 18/34]
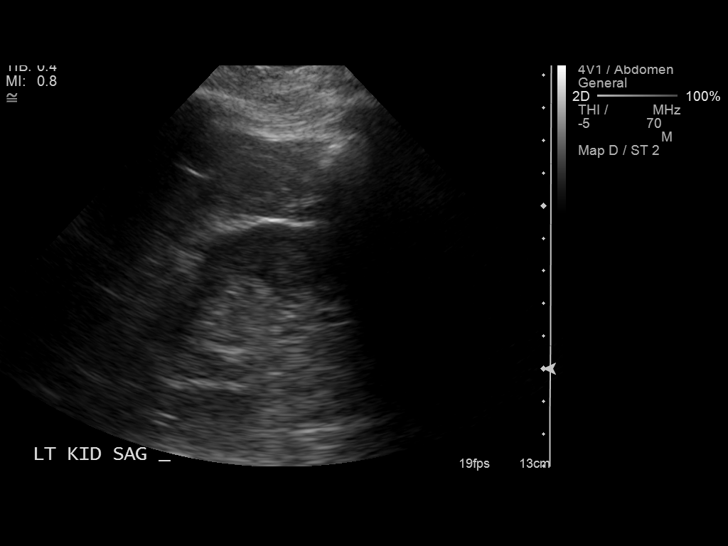
[im 21/34]
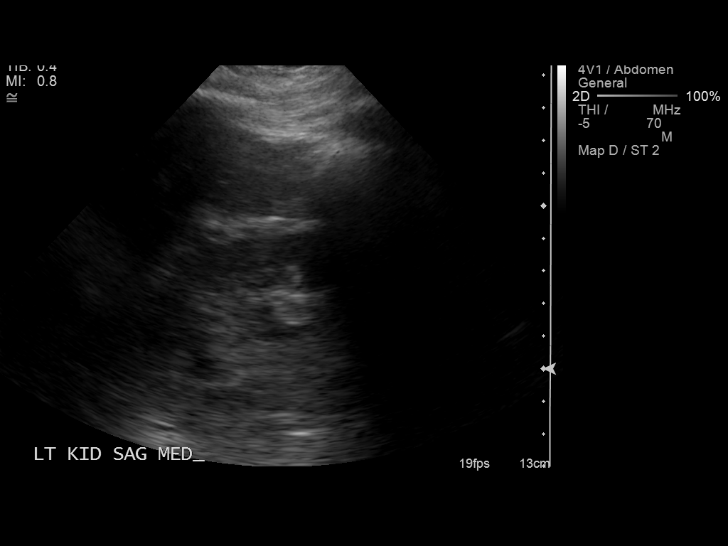
[im 23/34]
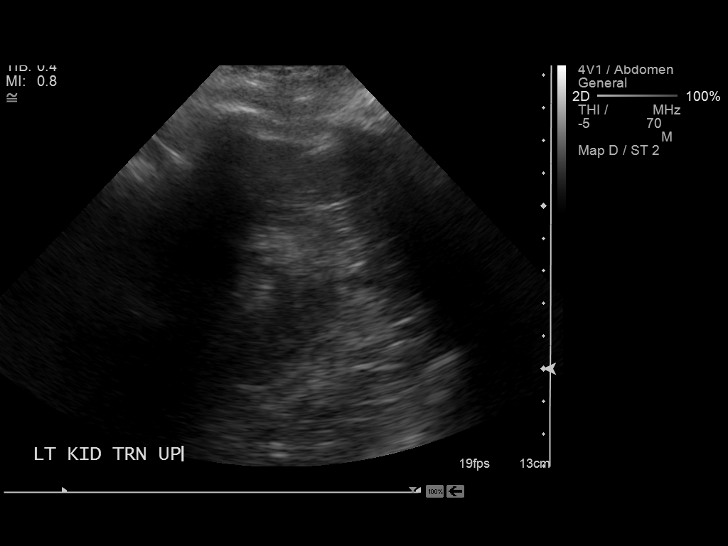
[im 25/34]
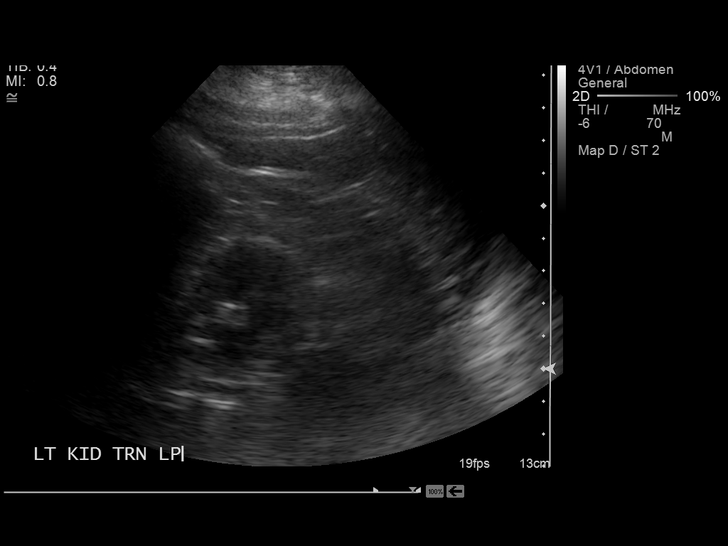
[im 28/34]
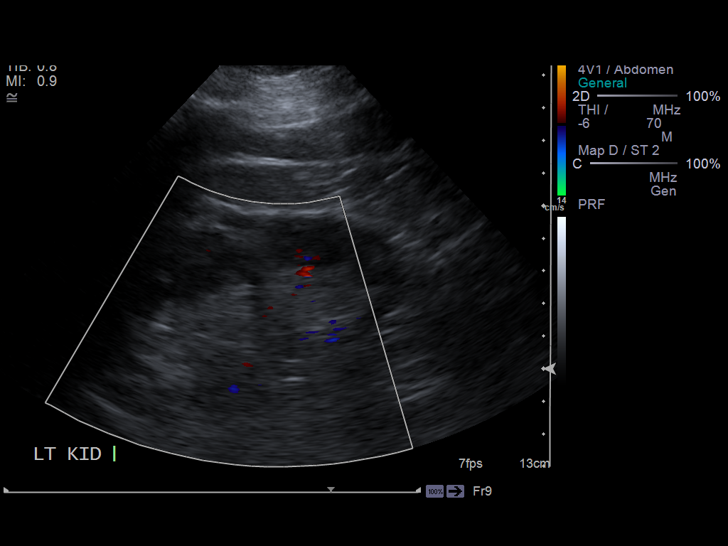
[im 31/34]
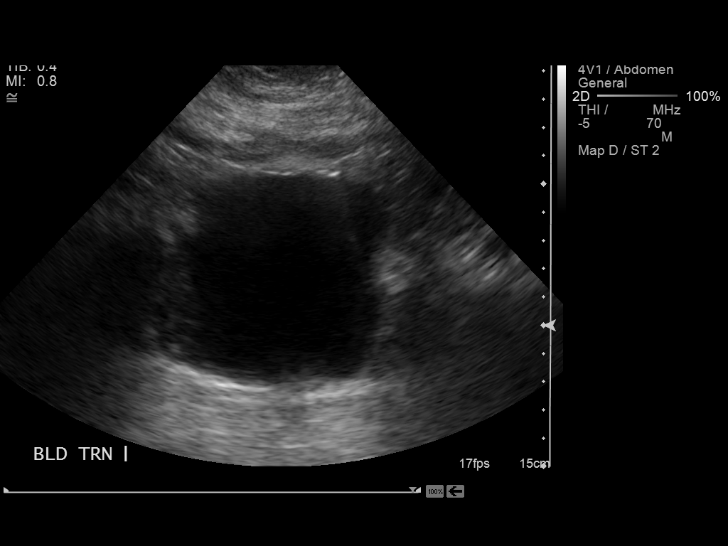
[im 34/34]
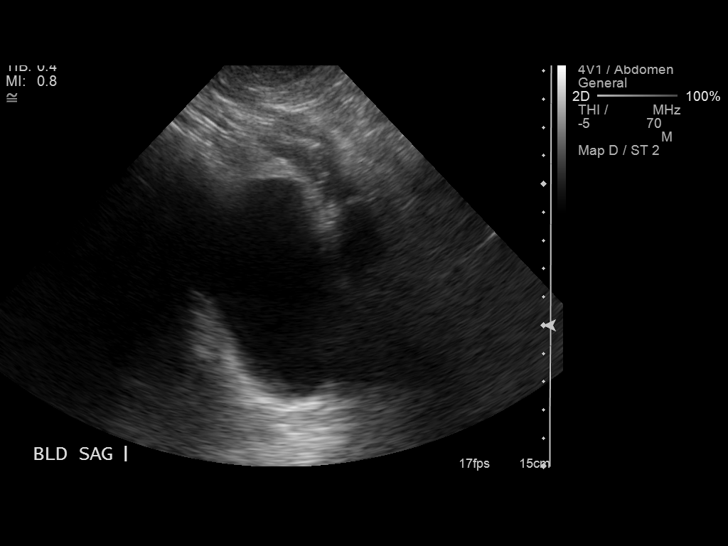

[14 of 25 positions shown; findings below may reference images not displayed]

FINDINGS: The right kidney measures 7.0 cm in long axis.  The left kidney
measures 9.4 cm.  Right kidney is atrophic.  The right
hydroureteronephrosis seen on the previous study is no longer
evident.

Imaging through the anatomic pelvis reveals an unremarkable
appearing urinary bladder.
IMPRESSION: Atrophic right kidney with unremarkable left kidney.  No evidence
for hydronephrosis.

## 2014-09-06 ENCOUNTER — Encounter (HOSPITAL_COMMUNITY): Payer: Medicare Other

## 2014-09-13 ENCOUNTER — Encounter (HOSPITAL_COMMUNITY): Payer: Medicare Other | Attending: Oncology

## 2014-09-13 ENCOUNTER — Encounter (HOSPITAL_COMMUNITY): Payer: Medicare Other | Attending: Hematology & Oncology

## 2014-09-13 ENCOUNTER — Encounter (HOSPITAL_COMMUNITY): Payer: Self-pay

## 2014-09-13 DIAGNOSIS — D631 Anemia in chronic kidney disease: Secondary | ICD-10-CM

## 2014-09-13 DIAGNOSIS — N184 Chronic kidney disease, stage 4 (severe): Secondary | ICD-10-CM

## 2014-09-13 LAB — CBC
HEMATOCRIT: 32.2 % — AB (ref 36.0–46.0)
Hemoglobin: 10 g/dL — ABNORMAL LOW (ref 12.0–15.0)
MCH: 30.1 pg (ref 26.0–34.0)
MCHC: 31.1 g/dL (ref 30.0–36.0)
MCV: 97 fL (ref 78.0–100.0)
Platelets: 223 10*3/uL (ref 150–400)
RBC: 3.32 MIL/uL — AB (ref 3.87–5.11)
RDW: 17.1 % — ABNORMAL HIGH (ref 11.5–15.5)
WBC: 8.5 10*3/uL (ref 4.0–10.5)

## 2014-09-13 IMAGING — CR DG CHEST 1V PORT
1 series · 1 of 1 positions shown · non-contrast
Comparison: Chest x-ray 11/03/2012.

CLINICAL DATA: Nausea and weakness.

PORTABLE CHEST - 1 VIEW

[view not recorded]
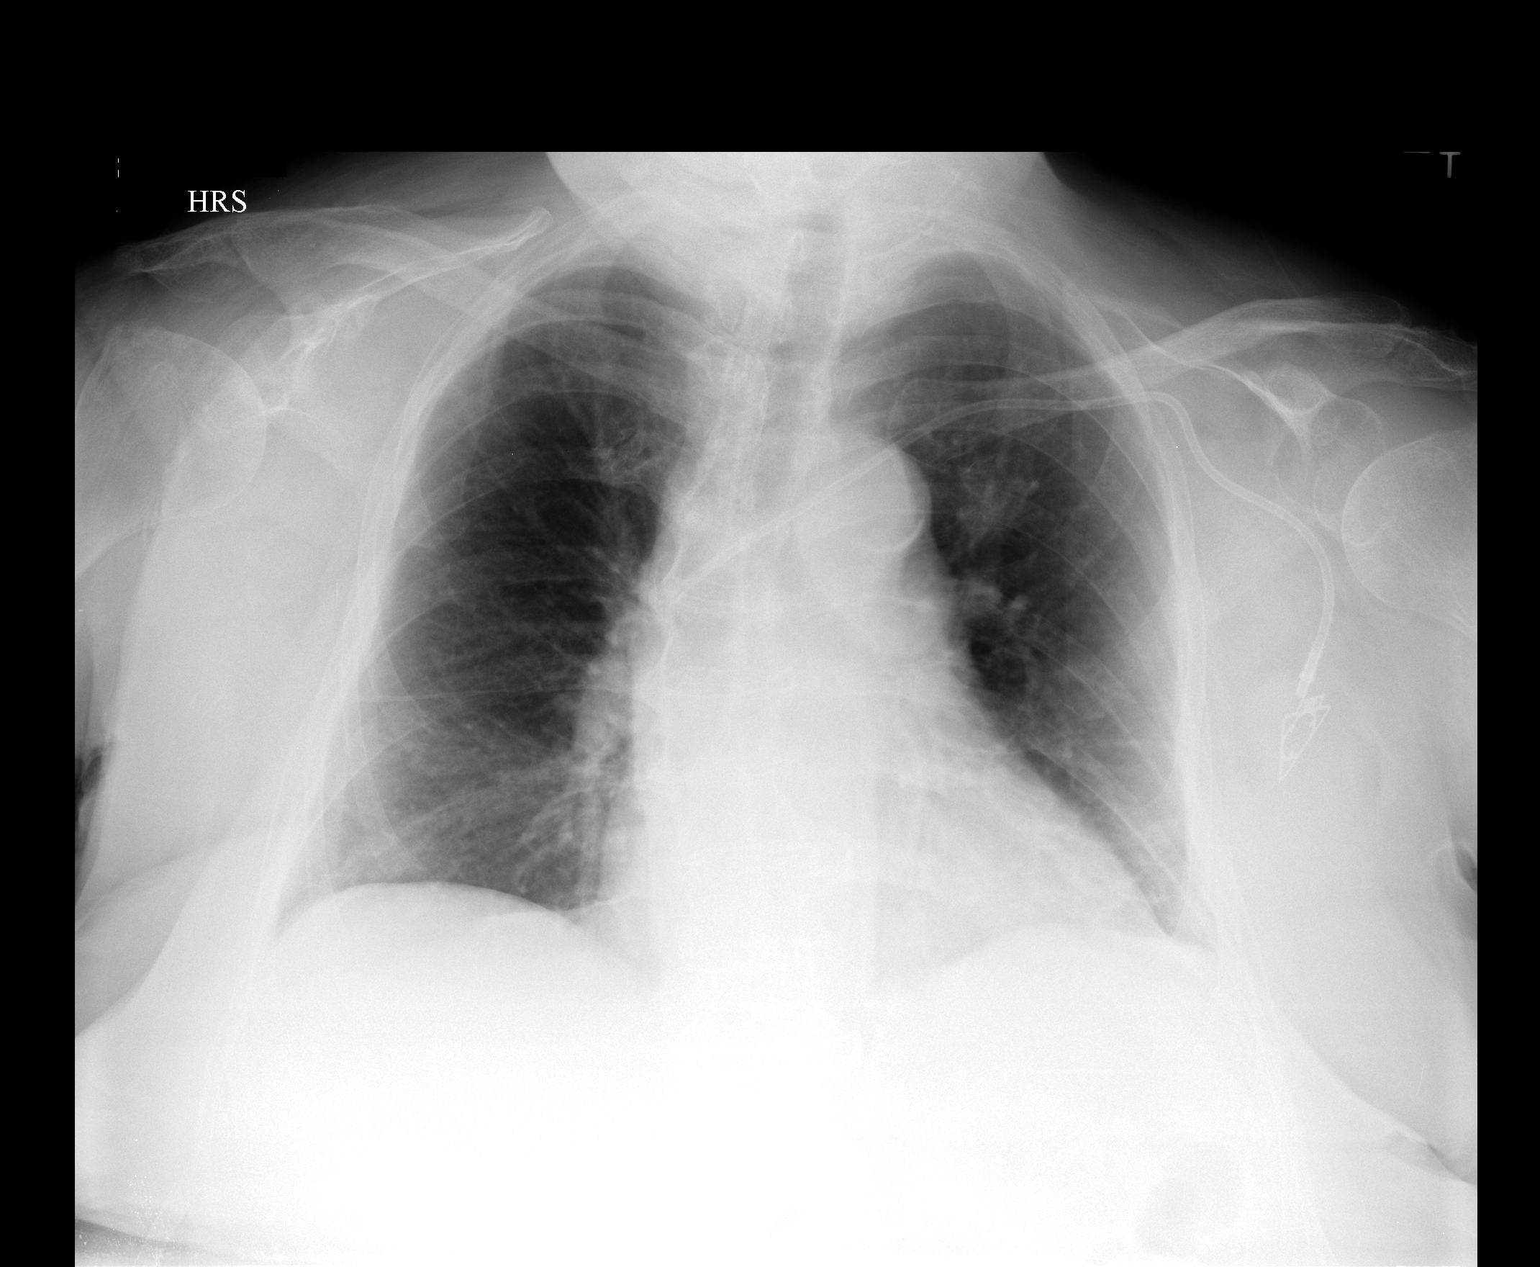

[1 of 1 positions shown; findings below may reference images not displayed]

FINDINGS: Left subclavian single lumen power Port-A-Cath with tip
terminating in the mid to distal superior vena cava.  Lung volumes
are within normal limits.  No acute consolidative airspace disease.
No pleural effusions.  No evidence of pulmonary edema.  Heart size
is within normal limits. The patient is rotated to the left on
today's exam, resulting in distortion of the mediastinal contours
and reduced diagnostic sensitivity and specificity for mediastinal
pathology.  Atherosclerosis in the thoracic aorta.
IMPRESSION: 1.  No radiographic evidence of acute cardiopulmonary disease.
2.  Atherosclerosis.

## 2014-09-13 MED ORDER — DARBEPOETIN ALFA 60 MCG/0.3ML IJ SOSY
PREFILLED_SYRINGE | INTRAMUSCULAR | Status: AC
  Filled 2014-09-13: qty 0.3

## 2014-09-13 MED ORDER — DARBEPOETIN ALFA 100 MCG/0.5ML IJ SOSY
PREFILLED_SYRINGE | INTRAMUSCULAR | Status: AC
  Filled 2014-09-13: qty 0.5

## 2014-09-13 MED ORDER — DARBEPOETIN ALFA 100 MCG/0.5ML IJ SOSY
80.0000 ug | PREFILLED_SYRINGE | Freq: Once | INTRAMUSCULAR | Status: AC
  Administered 2014-09-13: 80 ug via SUBCUTANEOUS

## 2014-09-13 NOTE — Progress Notes (Signed)
Labs drawn by rn 

## 2014-09-13 NOTE — Progress Notes (Signed)
[name] [name] presented for lab work. Labs per MD order drawn via Peripheral Line 23 gauge needle inserted in left anticub Good blood return present. Procedure without incident.  Needle removed intact. Patient tolerated procedure well.

## 2014-09-13 NOTE — Progress Notes (Signed)
[name] presents today for injection per MD orders. Aranesp  administered SQ in left Abdomen. Administration without incident. Patient tolerated well.

## 2014-09-26 NOTE — Progress Notes (Signed)
[name],[name] ROBERT, MD [address] [place] [address]  Follicular Lymphoma, treated with 6 cycles of treanda/rituxan. 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 female returns for follow-up of her follicular lymphoma.  She states her feet hurt and they swell in the daytime. She notes the swelling is gone in the morning she has diabetes but apparently is not always very good eating the right things. She states she may sweet potato cobbler in [place] her blood sugar quite high. She is able to do her ADLs. She cooks, washes dishes, sleeps the floors, goes to church. She states she simply eats too much and she sleeps well at night. She is to take an occasional stool softener. She has been on renal dosing of Aranesp now for greater than 6 weeks I discussed with her increasing her dose and she is agreeable.   MEDICAL HISTORY: Past Medical History  Diagnosis Date  . Hypertension   . Diabetes mellitus, type 2   . Hyperlipidemia   . Iron deficiency anemia 11/21/2012    At time of presentation.  Feraheme 1020 mg on 11/21/12.  . Follicular lymphoma 12/07/4130    Right ureteral obstruction by lymphadenopathy-> right hydronephrosis  . Port catheter in place 44/07/270    has Follicular lymphoma; Iron deficiency anemia; Chronic kidney disease (CKD), stage IV (severe); Diabetes mellitus type 2 in obese; Hypertension; UTI (urinary tract infection); Leukocytosis, unspecified; Hypoglycemia; Port catheter in place; CKD (chronic kidney disease); Anemia; and Dysuria on her problem list.     No history exists.     has No Known Allergies.  Ms. Collar does not currently have medications on  file.  SURGICAL HISTORY: Past Surgical History  Procedure Laterality Date  . Cataract extraction    . Cystoscopy w/ ureteral stent placement Right 11/03/2012    Procedure: CYSTOSCOPY WITH RETROGRADE PYELOGRAM/URETERAL STENT PLACEMENT;  Surgeon: Malka So, MD;  Location: [facility];  Service: Urology;  Laterality: Right;  . Portacath placement Left 11/03/2012    Procedure: INSERTION PORT-A-CATH;  Surgeon: Scherry Ran, MD;  Location: [facility];  Service: General;  Laterality: Left;  Insertion Port-A-Cath Left Subclavian  . Appendectomy    . Cystoscopy w/ ureteral stent placement Right 03/23/2013    Procedure: CYSTOSCOPY WITH STENT REPLACEMENT;  Surgeon: Malka So, MD;  Location: [facility];  Service: Urology;  Laterality: Right;    SOCIAL HISTORY: History   Social History  . Marital Status: Married    Spouse Name: N/A  . Number of Children: N/A  . Years of Education: N/A   Occupational History  . Not on file.   Social History Main Topics  . Smoking status: Never Smoker   . Smokeless tobacco: Never Used  . Alcohol Use: No  . Drug Use: No  . Sexual Activity: No   Other Topics Concern  . Not on file   Social History Narrative    FAMILY HISTORY: Family History  Problem Relation Age of Onset  . Cancer Father   . Cancer Brother     Review of Systems  Constitutional: Positive for malaise/fatigue. Negative for fever, chills and weight loss.  HENT: Negative for congestion, hearing loss, nosebleeds, sore throat and tinnitus.   Eyes: Negative for blurred vision, double  vision, pain and discharge.  Respiratory: Negative for cough, hemoptysis, sputum production, shortness of breath and wheezing.   Cardiovascular: Negative for chest pain, palpitations, claudication, leg swelling and PND.  Gastrointestinal: Negative for heartburn, nausea, vomiting, abdominal pain, diarrhea, constipation, blood in stool and melena.  Genitourinary: Negative for dysuria, urgency, frequency and  hematuria.  Musculoskeletal: Positive for joint pain. Negative for myalgias and falls.       Foot pain, foot swelling  Skin: Negative for itching and rash.  Neurological: Negative for dizziness, tingling, tremors, sensory change, speech change, focal weakness, seizures, loss of consciousness, weakness and headaches.  Endo/Heme/Allergies: Does not bruise/bleed easily.  Psychiatric/Behavioral: Negative for depression, suicidal ideas, memory loss and substance abuse. The patient is not nervous/anxious and does not have insomnia.     PHYSICAL EXAMINATION  ECOG PERFORMANCE STATUS: 1 - Symptomatic but completely ambulatory  There were no vitals filed for this visit.  Physical Exam  Constitutional: She is oriented to person, place, and time and well-developed, well-nourished, and in no distress.  Obese  HENT:  Head: Normocephalic and atraumatic.  Nose: Nose normal.  Mouth/Throat: Oropharynx is clear and moist. No oropharyngeal exudate.  Eyes: Conjunctivae and EOM are normal. Pupils are equal, round, and reactive to light. Right eye exhibits no discharge. Left eye exhibits no discharge. No scleral icterus.  Neck: Normal range of motion. Neck supple. No tracheal deviation present. No thyromegaly present.  Cardiovascular: Normal rate and regular rhythm.  Exam reveals no gallop and no friction rub.   Murmur heard. Pulmonary/Chest: Effort normal and breath sounds normal. She has no wheezes. She has no rales.  Abdominal: Soft. Bowel sounds are normal. She exhibits no distension and no mass. There is no tenderness. There is no rebound and no guarding.  Musculoskeletal: Normal range of motion. She exhibits edema.  Lymphadenopathy:    She has no cervical adenopathy.  Neurological: She is alert and oriented to person, place, and time. She has normal reflexes. No cranial nerve deficit. Gait normal. Coordination normal.  Skin: Skin is warm and dry. No rash noted.  Psychiatric: Mood, memory, affect and  judgment normal.  Nursing note and vitals reviewed.   LABORATORY DATA:  CBC    Component Value Date/Time   WBC 8.5 09/13/2014 1124   RBC 3.32* 09/13/2014 1124   RBC 3.17* 07/26/2014 0956   HGB 10.0* 09/13/2014 1124   HCT 32.2* 09/13/2014 1124   PLT 223 09/13/2014 1124   MCV 97.0 09/13/2014 1124   MCH 30.1 09/13/2014 1124   MCHC 31.1 09/13/2014 1124   RDW 17.1* 09/13/2014 1124   LYMPHSABS 0.6* 07/26/2014 0956   MONOABS 0.4 07/26/2014 0956   EOSABS 0.1 07/26/2014 0956   BASOSABS 0.0 07/26/2014 0956   CMP     Component Value Date/Time   NA 137 07/26/2014 0956   K 4.5 07/26/2014 0956   CL 104 07/26/2014 0956   CO2 25 07/26/2014 0956   GLUCOSE 329* 07/26/2014 0956   BUN 29* 07/26/2014 0956   CREATININE 2.03* 07/26/2014 0956   CALCIUM 8.9 07/26/2014 0956   PROT 5.9* 07/26/2014 0956   ALBUMIN 3.4* 07/26/2014 0956   AST 18 07/26/2014 0956   ALT 18 07/26/2014 0956   ALKPHOS 101 07/26/2014 0956   BILITOT 0.3 07/26/2014 0956   GFRNONAA 23* 07/26/2014 0956   GFRAA 27* 07/26/2014 0956     ASSESSMENT and THERAPY PLAN:  Follicular lymphoma  74 year old female with follicular lymphoma, she was treated with Treanda and Rituxan. She is currently  on Rituxan maintenance. She seems to be doing fairly well I suspect some of her symptoms of foot discomfort may be diabetic related. She does not always control her blood sugars well. At her next visit we will address any additional Rituxan therapy she may need but I believe she is at the end of her maintenance therapy. I will plan on seeing her back again in 2 months, sooner if needed.  Anemia  I suspect this is multifactorial but certainly significant deviation from her chronic kidney disease. I am going to increase her Aranesp 100 g every 2 weeks. I anticipate she will not need any additional increases after this. We will continue to monitor counts moving forward and if she needs additional valuation of her anemia we can proceed with  that at her next visit.   All questions were answered. The patient knows to call the clinic with any problems, questions or concerns. We can certainly see the patient much sooner if necessary.   Molli Hazard 09/26/2014

## 2014-09-27 ENCOUNTER — Ambulatory Visit (HOSPITAL_COMMUNITY): Payer: Medicare Other | Admitting: Hematology & Oncology

## 2014-09-27 ENCOUNTER — Encounter (HOSPITAL_COMMUNITY): Payer: Medicare Other | Attending: Hematology & Oncology

## 2014-09-27 ENCOUNTER — Encounter (HOSPITAL_BASED_OUTPATIENT_CLINIC_OR_DEPARTMENT_OTHER): Payer: Medicare Other

## 2014-09-27 ENCOUNTER — Encounter (HOSPITAL_COMMUNITY): Payer: Self-pay | Admitting: Hematology & Oncology

## 2014-09-27 ENCOUNTER — Inpatient Hospital Stay (HOSPITAL_COMMUNITY): Payer: Medicare Other

## 2014-09-27 ENCOUNTER — Encounter (HOSPITAL_COMMUNITY): Payer: Medicare Other | Attending: Hematology and Oncology

## 2014-09-27 ENCOUNTER — Encounter (HOSPITAL_COMMUNITY): Payer: Medicare Other | Attending: Oncology | Admitting: Hematology & Oncology

## 2014-09-27 ENCOUNTER — Encounter (HOSPITAL_COMMUNITY): Payer: Medicare Other

## 2014-09-27 VITALS — BP 160/57 | HR 65 | Temp 98.6°F | Resp 16 | Wt 225.5 lb

## 2014-09-27 DIAGNOSIS — N184 Chronic kidney disease, stage 4 (severe): Secondary | ICD-10-CM | POA: Diagnosis present

## 2014-09-27 DIAGNOSIS — C8210 Follicular lymphoma grade II, unspecified site: Secondary | ICD-10-CM | POA: Diagnosis not present

## 2014-09-27 DIAGNOSIS — C8290 Follicular lymphoma, unspecified, unspecified site: Secondary | ICD-10-CM

## 2014-09-27 DIAGNOSIS — Z5112 Encounter for antineoplastic immunotherapy: Secondary | ICD-10-CM | POA: Diagnosis not present

## 2014-09-27 DIAGNOSIS — D631 Anemia in chronic kidney disease: Secondary | ICD-10-CM

## 2014-09-27 DIAGNOSIS — N189 Chronic kidney disease, unspecified: Secondary | ICD-10-CM

## 2014-09-27 DIAGNOSIS — R300 Dysuria: Secondary | ICD-10-CM | POA: Insufficient documentation

## 2014-09-27 LAB — CBC WITH DIFFERENTIAL/PLATELET
Basophils Absolute: 0 10*3/uL (ref 0.0–0.1)
Basophils Relative: 0 % (ref 0–1)
Eosinophils Absolute: 0.1 10*3/uL (ref 0.0–0.7)
Eosinophils Relative: 2 % (ref 0–5)
HCT: 32.4 % — ABNORMAL LOW (ref 36.0–46.0)
HEMOGLOBIN: 10.3 g/dL — AB (ref 12.0–15.0)
LYMPHS PCT: 8 % — AB (ref 12–46)
Lymphs Abs: 0.7 10*3/uL (ref 0.7–4.0)
MCH: 30.8 pg (ref 26.0–34.0)
MCHC: 31.8 g/dL (ref 30.0–36.0)
MCV: 97 fL (ref 78.0–100.0)
MONOS PCT: 6 % (ref 3–12)
Monocytes Absolute: 0.5 10*3/uL (ref 0.1–1.0)
NEUTROS ABS: 6.7 10*3/uL (ref 1.7–7.7)
Neutrophils Relative %: 84 % — ABNORMAL HIGH (ref 43–77)
Platelets: 235 10*3/uL (ref 150–400)
RBC: 3.34 MIL/uL — AB (ref 3.87–5.11)
RDW: 16.8 % — ABNORMAL HIGH (ref 11.5–15.5)
WBC: 8 10*3/uL (ref 4.0–10.5)

## 2014-09-27 LAB — COMPREHENSIVE METABOLIC PANEL
ALT: 19 U/L (ref 0–35)
ANION GAP: 9 (ref 5–15)
AST: 22 U/L (ref 0–37)
Albumin: 3.6 g/dL (ref 3.5–5.2)
Alkaline Phosphatase: 106 U/L (ref 39–117)
BUN: 37 mg/dL — AB (ref 6–23)
CO2: 24 mmol/L (ref 19–32)
Calcium: 9.2 mg/dL (ref 8.4–10.5)
Chloride: 102 mmol/L (ref 96–112)
Creatinine, Ser: 2.22 mg/dL — ABNORMAL HIGH (ref 0.50–1.10)
GFR calc Af Amer: 24 mL/min — ABNORMAL LOW (ref >90)
GFR calc non Af Amer: 21 mL/min — ABNORMAL LOW (ref >90)
Glucose, Bld: 319 mg/dL — ABNORMAL HIGH (ref 70–99)
POTASSIUM: 5 mmol/L (ref 3.5–5.1)
Sodium: 135 mmol/L (ref 135–145)
TOTAL PROTEIN: 6.4 g/dL (ref 6.0–8.3)
Total Bilirubin: 0.4 mg/dL (ref 0.3–1.2)

## 2014-09-27 LAB — LACTATE DEHYDROGENASE: LDH: 226 U/L (ref 94–250)

## 2014-09-27 MED ORDER — DARBEPOETIN ALFA 100 MCG/0.5ML IJ SOSY
100.0000 ug | PREFILLED_SYRINGE | Freq: Once | INTRAMUSCULAR | Status: AC
  Administered 2014-09-27: 100 ug via SUBCUTANEOUS

## 2014-09-27 MED ORDER — DARBEPOETIN ALFA 100 MCG/0.5ML IJ SOSY
PREFILLED_SYRINGE | INTRAMUSCULAR | Status: AC
  Filled 2014-09-27: qty 0.5

## 2014-09-27 MED ORDER — HEPARIN SOD (PORK) LOCK FLUSH 100 UNIT/ML IV SOLN
INTRAVENOUS | Status: AC
  Filled 2014-09-27: qty 5

## 2014-09-27 MED ORDER — HEPARIN SOD (PORK) LOCK FLUSH 100 UNIT/ML IV SOLN
500.0000 [IU] | Freq: Once | INTRAVENOUS | Status: AC | PRN
  Administered 2014-09-27: 500 [IU]

## 2014-09-27 MED ORDER — SODIUM CHLORIDE 0.9 % IV SOLN
Freq: Once | INTRAVENOUS | Status: AC
  Administered 2014-09-27: 10:00:00 via INTRAVENOUS

## 2014-09-27 MED ORDER — SODIUM CHLORIDE 0.9 % IJ SOLN
10.0000 mL | INTRAMUSCULAR | Status: DC | PRN
  Administered 2014-09-27: 10 mL
  Filled 2014-09-27: qty 10

## 2014-09-27 MED ORDER — SODIUM CHLORIDE 0.9 % IV SOLN
375.0000 mg/m2 | Freq: Once | INTRAVENOUS | Status: AC
  Administered 2014-09-27: 800 mg via INTRAVENOUS
  Filled 2014-09-27: qty 70

## 2014-09-27 NOTE — Progress Notes (Signed)
0900 Per patient she took tylenol and benadryl at home this morning prior to today;s appointment. [name] [name] presents today for injection per MD orders. Aranesp 100 mcg administered SQ in right Abdomen. Administration without incident. Patient tolerated well. Tolerated infusion well.

## 2014-09-27 NOTE — Patient Instructions (Signed)
[facility] Discharge Instructions for Patients Receiving Chemotherapy  Today you received the following chemotherapy agents Rituxan. Aranesp injection today as ordered.  To help prevent nausea and vomiting after your treatment, we encourage you to take your nausea medication as instructed. If you develop nausea and vomiting that is not controlled by your nausea medication, call the clinic. If it is after clinic hours your family physician or the after hours number for the clinic or go to the Emergency Department. BELOW ARE SYMPTOMS THAT SHOULD BE REPORTED IMMEDIATELY:  *FEVER GREATER THAN 101.0 F  *CHILLS WITH OR WITHOUT FEVER  NAUSEA AND VOMITING THAT IS NOT CONTROLLED WITH YOUR NAUSEA MEDICATION  *UNUSUAL SHORTNESS OF BREATH  *UNUSUAL BRUISING OR BLEEDING  TENDERNESS IN MOUTH AND THROAT WITH OR WITHOUT PRESENCE OF ULCERS  *URINARY PROBLEMS  *BOWEL PROBLEMS  UNUSUAL RASH Items with * indicate a potential emergency and should be followed up as soon as possible.  Return as scheduled.  I have been informed and understand all the instructions given to me. I know to contact the clinic, my physician, or go to the Emergency Department if any problems should occur. I do not have any questions at this time, but understand that I may call the clinic during office hours or the Patient Navigator at [phone] should I have any questions or need assistance in obtaining follow up care.    __________________________________________  _____________  __________ Signature of Patient or Authorized Representative            Date                   Time    __________________________________________ Nurse's Signature

## 2014-09-27 NOTE — Patient Instructions (Signed)
[place] at [facility] Discharge Instructions  RECOMMENDATIONS MADE BY THE CONSULTANT AND ANY TEST RESULTS WILL BE SENT TO YOUR REFERRING PHYSICIAN.  Exam and discussion by Dr. Youlanda Roys are doing well No changes in treatment Report fevers, chills, uncontrolled weight loss or other concerns.  Labs and injections every 2 weeks Office visit in 2 months.  Thank you for choosing [place] at [facility] to provide your oncology and hematology care.  To afford each patient quality time with our provider, please arrive at least 15 minutes before your scheduled appointment time.    You need to re-schedule your appointment should you arrive 10 or more minutes late.  We strive to give you quality time with our providers, and arriving late affects you and other patients whose appointments are after yours.  Also, if you no show three or more times for appointments you may be dismissed from the clinic at the providers discretion.     Again, thank you for choosing [facility].  Our hope is that these requests will decrease the amount of time that you wait before being seen by our physicians.       _____________________________________________________________  Should you have questions after your visit to [facility], please contact our office at [phone] between the hours of 8:30 a.m. and 4:30 p.m.  Voicemails left after 4:30 p.m. will not be returned until the following business day.  For prescription refill requests, have your pharmacy contact our office.

## 2014-09-27 NOTE — Progress Notes (Signed)
Lab draw

## 2014-09-27 NOTE — Patient Instructions (Signed)
[place] at [facility] Discharge Instructions  RECOMMENDATIONS MADE BY THE CONSULTANT AND ANY TEST RESULTS WILL BE SENT TO YOUR REFERRING PHYSICIAN.  Exam and discussion by Dr. Whitney Muse. You are doing well Report unexplained weight loss, shortness of breath or other concerns.   Office visit in 2 months.  Thank you for choosing [place] at [facility] to provide your oncology and hematology care.  To afford each patient quality time with our provider, please arrive at least 15 minutes before your scheduled appointment time.    You need to re-schedule your appointment should you arrive 10 or more minutes late.  We strive to give you quality time with our providers, and arriving late affects you and other patients whose appointments are after yours.  Also, if you no show three or more times for appointments you may be dismissed from the clinic at the providers discretion.     Again, thank you for choosing [facility].  Our hope is that these requests will decrease the amount of time that you wait before being seen by our physicians.       _____________________________________________________________  Should you have questions after your visit to [facility], please contact our office at [phone] between the hours of 8:30 a.m. and 4:30 p.m.  Voicemails left after 4:30 p.m. will not be returned until the following business day.  For prescription refill requests, have your pharmacy contact our office.

## 2014-10-10 ENCOUNTER — Encounter (HOSPITAL_BASED_OUTPATIENT_CLINIC_OR_DEPARTMENT_OTHER): Payer: Medicare Other

## 2014-10-10 ENCOUNTER — Encounter (HOSPITAL_COMMUNITY): Payer: Self-pay

## 2014-10-10 DIAGNOSIS — N184 Chronic kidney disease, stage 4 (severe): Secondary | ICD-10-CM | POA: Diagnosis not present

## 2014-10-10 DIAGNOSIS — D631 Anemia in chronic kidney disease: Secondary | ICD-10-CM

## 2014-10-10 DIAGNOSIS — R300 Dysuria: Secondary | ICD-10-CM | POA: Diagnosis not present

## 2014-10-10 DIAGNOSIS — C8290 Follicular lymphoma, unspecified, unspecified site: Secondary | ICD-10-CM | POA: Diagnosis present

## 2014-10-10 LAB — CBC
HCT: 30.7 % — ABNORMAL LOW (ref 36.0–46.0)
Hemoglobin: 10 g/dL — ABNORMAL LOW (ref 12.0–15.0)
MCH: 31.8 pg (ref 26.0–34.0)
MCHC: 32.6 g/dL (ref 30.0–36.0)
MCV: 97.8 fL (ref 78.0–100.0)
Platelets: 228 10*3/uL (ref 150–400)
RBC: 3.14 MIL/uL — AB (ref 3.87–5.11)
RDW: 17 % — ABNORMAL HIGH (ref 11.5–15.5)
WBC: 8.8 10*3/uL (ref 4.0–10.5)

## 2014-10-10 MED ORDER — DARBEPOETIN ALFA 100 MCG/0.5ML IJ SOSY
PREFILLED_SYRINGE | INTRAMUSCULAR | Status: AC
  Filled 2014-10-10: qty 0.5

## 2014-10-10 MED ORDER — DARBEPOETIN ALFA 100 MCG/0.5ML IJ SOSY
100.0000 ug | PREFILLED_SYRINGE | Freq: Once | INTRAMUSCULAR | Status: AC
  Administered 2014-10-10: 100 ug via SUBCUTANEOUS

## 2014-10-10 NOTE — Progress Notes (Signed)
LABS DRAWN

## 2014-10-10 NOTE — Patient Instructions (Signed)
[place] at [facility] Discharge Instructions  RECOMMENDATIONS MADE BY THE CONSULTANT AND ANY TEST RESULTS WILL BE SENT TO YOUR REFERRING PHYSICIAN.  Today your received Aranesp for hemoglobin of 10. Return as scheduled for lab work, injections, Rituxan, and office visits.  Thank you for choosing [place] at [facility] to provide your oncology and hematology care.  To afford each patient quality time with our provider, please arrive at least 15 minutes before your scheduled appointment time.    You need to re-schedule your appointment should you arrive 10 or more minutes late.  We strive to give you quality time with our providers, and arriving late affects you and other patients whose appointments are after yours.  Also, if you no show three or more times for appointments you may be dismissed from the clinic at the providers discretion.     Again, thank you for choosing [facility].  Our hope is that these requests will decrease the amount of time that you wait before being seen by our physicians.       _____________________________________________________________  Should you have questions after your visit to [facility], please contact our office at [phone] between the hours of 8:30 a.m. and 4:30 p.m.  Voicemails left after 4:30 p.m. will not be returned until the following business day.  For prescription refill requests, have your pharmacy contact our office.

## 2014-10-18 ENCOUNTER — Ambulatory Visit (HOSPITAL_COMMUNITY): Payer: Medicare Other

## 2014-10-25 ENCOUNTER — Encounter (HOSPITAL_COMMUNITY): Payer: Medicare Other | Attending: Oncology

## 2014-10-25 ENCOUNTER — Encounter (HOSPITAL_COMMUNITY): Payer: Self-pay

## 2014-10-25 ENCOUNTER — Encounter (HOSPITAL_BASED_OUTPATIENT_CLINIC_OR_DEPARTMENT_OTHER): Payer: Medicare Other

## 2014-10-25 VITALS — BP 177/58 | HR 55 | Temp 98.3°F | Resp 18

## 2014-10-25 DIAGNOSIS — R300 Dysuria: Secondary | ICD-10-CM | POA: Diagnosis not present

## 2014-10-25 DIAGNOSIS — N184 Chronic kidney disease, stage 4 (severe): Secondary | ICD-10-CM

## 2014-10-25 DIAGNOSIS — D631 Anemia in chronic kidney disease: Secondary | ICD-10-CM | POA: Diagnosis not present

## 2014-10-25 DIAGNOSIS — C8290 Follicular lymphoma, unspecified, unspecified site: Secondary | ICD-10-CM | POA: Insufficient documentation

## 2014-10-25 LAB — CBC
HEMATOCRIT: 31.2 % — AB (ref 36.0–46.0)
HEMOGLOBIN: 10.1 g/dL — AB (ref 12.0–15.0)
MCH: 31.9 pg (ref 26.0–34.0)
MCHC: 32.4 g/dL (ref 30.0–36.0)
MCV: 98.4 fL (ref 78.0–100.0)
Platelets: 219 10*3/uL (ref 150–400)
RBC: 3.17 MIL/uL — ABNORMAL LOW (ref 3.87–5.11)
RDW: 16.4 % — ABNORMAL HIGH (ref 11.5–15.5)
WBC: 8.2 10*3/uL (ref 4.0–10.5)

## 2014-10-25 MED ORDER — DARBEPOETIN ALFA 100 MCG/0.5ML IJ SOSY
100.0000 ug | PREFILLED_SYRINGE | Freq: Once | INTRAMUSCULAR | Status: AC
  Administered 2014-10-25: 100 ug via SUBCUTANEOUS
  Filled 2014-10-25: qty 0.5

## 2014-10-25 NOTE — Progress Notes (Signed)
1105:  Informed T. Kefalas, PA of pt's elevated BP.  Okay to give Aranesp today per PA.  [name] given injection per the provider's orders.  Aranesp administration without incident; see MAR for injection details.  Patient tolerated procedure well and without incident.  No questions or complaints noted at this time.

## 2014-10-25 NOTE — Patient Instructions (Signed)
[place] at [facility] Discharge Instructions  RECOMMENDATIONS MADE BY THE CONSULTANT AND ANY TEST RESULTS WILL BE SENT TO YOUR REFERRING PHYSICIAN.  Return as scheduled for lab work, injections, Rituxan infusions, and office visits.    Thank you for choosing [place] at [facility] to provide your oncology and hematology care.  To afford each patient quality time with our provider, please arrive at least 15 minutes before your scheduled appointment time.    You need to re-schedule your appointment should you arrive 10 or more minutes late.  We strive to give you quality time with our providers, and arriving late affects you and other patients whose appointments are after yours.  Also, if you no show three or more times for appointments you may be dismissed from the clinic at the providers discretion.     Again, thank you for choosing [facility].  Our hope is that these requests will decrease the amount of time that you wait before being seen by our physicians.       _____________________________________________________________  Should you have questions after your visit to [facility], please contact our office at [phone] between the hours of 8:30 a.m. and 4:30 p.m.  Voicemails left after 4:30 p.m. will not be returned until the following business day.  For prescription refill requests, have your pharmacy contact our office.

## 2014-10-25 NOTE — Progress Notes (Signed)
Labs drawn

## 2014-11-04 ENCOUNTER — Ambulatory Visit (HOSPITAL_COMMUNITY): Payer: Medicare Other

## 2014-11-08 ENCOUNTER — Encounter (HOSPITAL_BASED_OUTPATIENT_CLINIC_OR_DEPARTMENT_OTHER): Payer: Medicare Other

## 2014-11-08 ENCOUNTER — Encounter (HOSPITAL_COMMUNITY): Payer: Self-pay

## 2014-11-08 VITALS — BP 191/64 | HR 64 | Temp 98.3°F | Resp 18

## 2014-11-08 DIAGNOSIS — N189 Chronic kidney disease, unspecified: Secondary | ICD-10-CM | POA: Diagnosis not present

## 2014-11-08 DIAGNOSIS — N184 Chronic kidney disease, stage 4 (severe): Secondary | ICD-10-CM

## 2014-11-08 DIAGNOSIS — D631 Anemia in chronic kidney disease: Secondary | ICD-10-CM

## 2014-11-08 DIAGNOSIS — R300 Dysuria: Secondary | ICD-10-CM | POA: Diagnosis not present

## 2014-11-08 LAB — CBC
HCT: 31.4 % — ABNORMAL LOW (ref 36.0–46.0)
HEMOGLOBIN: 10 g/dL — AB (ref 12.0–15.0)
MCH: 31.4 pg (ref 26.0–34.0)
MCHC: 31.8 g/dL (ref 30.0–36.0)
MCV: 98.7 fL (ref 78.0–100.0)
Platelets: 214 10*3/uL (ref 150–400)
RBC: 3.18 MIL/uL — ABNORMAL LOW (ref 3.87–5.11)
RDW: 15.9 % — ABNORMAL HIGH (ref 11.5–15.5)
WBC: 8.2 10*3/uL (ref 4.0–10.5)

## 2014-11-08 MED ORDER — DARBEPOETIN ALFA 100 MCG/0.5ML IJ SOSY
100.0000 ug | PREFILLED_SYRINGE | Freq: Once | INTRAMUSCULAR | Status: AC
  Administered 2014-11-08: 100 ug via SUBCUTANEOUS

## 2014-11-08 MED ORDER — DARBEPOETIN ALFA 100 MCG/0.5ML IJ SOSY
PREFILLED_SYRINGE | INTRAMUSCULAR | Status: AC
  Filled 2014-11-08: qty 0.5

## 2014-11-08 NOTE — Progress Notes (Signed)
LABS DRAWN

## 2014-11-08 NOTE — Progress Notes (Signed)
1045:  T. Kefalas, [place] notified of pt's BP; per PA, okay to give Aranesp.

## 2014-11-08 NOTE — Patient Instructions (Addendum)
[place] at [facility] Discharge Instructions  RECOMMENDATIONS MADE BY THE CONSULTANT AND ANY TEST RESULTS WILL BE SENT TO YOUR REFERRING PHYSICIAN.  Aranesp today. Return as scheduled for labs and injections.  Thank you for choosing [place] at [facility] to provide your oncology and hematology care.  To afford each patient quality time with our provider, please arrive at least 15 minutes before your scheduled appointment time.    You need to re-schedule your appointment should you arrive 10 or more minutes late.  We strive to give you quality time with our providers, and arriving late affects you and other patients whose appointments are after yours.  Also, if you no show three or more times for appointments you may be dismissed from the clinic at the providers discretion.     Again, thank you for choosing [facility].  Our hope is that these requests will decrease the amount of time that you wait before being seen by our physicians.       _____________________________________________________________  Should you have questions after your visit to [facility], please contact our office at [phone] between the hours of 8:30 a.m. and 4:30 p.m.  Voicemails left after 4:30 p.m. will not be returned until the following business day.  For prescription refill requests, have your pharmacy contact our office.

## 2014-11-15 ENCOUNTER — Ambulatory Visit (HOSPITAL_COMMUNITY): Payer: Medicare Other

## 2014-11-22 ENCOUNTER — Encounter (HOSPITAL_BASED_OUTPATIENT_CLINIC_OR_DEPARTMENT_OTHER): Payer: Medicare Other

## 2014-11-22 ENCOUNTER — Encounter (HOSPITAL_COMMUNITY): Payer: Medicare Other | Attending: Oncology

## 2014-11-22 ENCOUNTER — Other Ambulatory Visit (HOSPITAL_COMMUNITY): Payer: Self-pay | Admitting: Hematology & Oncology

## 2014-11-22 ENCOUNTER — Encounter (HOSPITAL_COMMUNITY): Payer: Self-pay | Admitting: Lab

## 2014-11-22 ENCOUNTER — Encounter (HOSPITAL_COMMUNITY): Payer: Self-pay | Admitting: Hematology & Oncology

## 2014-11-22 ENCOUNTER — Encounter (HOSPITAL_BASED_OUTPATIENT_CLINIC_OR_DEPARTMENT_OTHER): Payer: Medicare Other | Admitting: Hematology & Oncology

## 2014-11-22 ENCOUNTER — Encounter (HOSPITAL_COMMUNITY): Payer: Medicare Other

## 2014-11-22 VITALS — BP 159/57 | HR 70 | Temp 98.2°F | Resp 18 | Wt 224.8 lb

## 2014-11-22 VITALS — BP 161/75 | HR 68 | Temp 97.5°F | Resp 16

## 2014-11-22 DIAGNOSIS — C8290 Follicular lymphoma, unspecified, unspecified site: Secondary | ICD-10-CM | POA: Diagnosis not present

## 2014-11-22 DIAGNOSIS — D509 Iron deficiency anemia, unspecified: Secondary | ICD-10-CM | POA: Diagnosis not present

## 2014-11-22 DIAGNOSIS — D631 Anemia in chronic kidney disease: Secondary | ICD-10-CM

## 2014-11-22 DIAGNOSIS — N184 Chronic kidney disease, stage 4 (severe): Secondary | ICD-10-CM

## 2014-11-22 DIAGNOSIS — Z5112 Encounter for antineoplastic immunotherapy: Secondary | ICD-10-CM

## 2014-11-22 DIAGNOSIS — R300 Dysuria: Secondary | ICD-10-CM

## 2014-11-22 DIAGNOSIS — N6489 Other specified disorders of breast: Secondary | ICD-10-CM

## 2014-11-22 DIAGNOSIS — Z139 Encounter for screening, unspecified: Secondary | ICD-10-CM

## 2014-11-22 DIAGNOSIS — N189 Chronic kidney disease, unspecified: Secondary | ICD-10-CM | POA: Diagnosis not present

## 2014-11-22 DIAGNOSIS — Z09 Encounter for follow-up examination after completed treatment for conditions other than malignant neoplasm: Secondary | ICD-10-CM

## 2014-11-22 LAB — COMPREHENSIVE METABOLIC PANEL
ALT: 18 U/L (ref 14–54)
ANION GAP: 9 (ref 5–15)
AST: 22 U/L (ref 15–41)
Albumin: 3.5 g/dL (ref 3.5–5.0)
Alkaline Phosphatase: 100 U/L (ref 38–126)
BILIRUBIN TOTAL: 0.4 mg/dL (ref 0.3–1.2)
BUN: 35 mg/dL — ABNORMAL HIGH (ref 6–20)
CALCIUM: 8.7 mg/dL — AB (ref 8.9–10.3)
CO2: 21 mmol/L — ABNORMAL LOW (ref 22–32)
CREATININE: 2.13 mg/dL — AB (ref 0.44–1.00)
Chloride: 107 mmol/L (ref 101–111)
GFR calc Af Amer: 25 mL/min — ABNORMAL LOW (ref >60)
GFR, EST NON AFRICAN AMERICAN: 22 mL/min — AB (ref >60)
Glucose, Bld: 277 mg/dL — ABNORMAL HIGH (ref 65–99)
Potassium: 4.8 mmol/L (ref 3.5–5.1)
Sodium: 137 mmol/L (ref 135–145)
Total Protein: 6.1 g/dL — ABNORMAL LOW (ref 6.5–8.1)

## 2014-11-22 LAB — CBC
HEMATOCRIT: 31.7 % — AB (ref 36.0–46.0)
Hemoglobin: 10.1 g/dL — ABNORMAL LOW (ref 12.0–15.0)
MCH: 31.5 pg (ref 26.0–34.0)
MCHC: 31.9 g/dL (ref 30.0–36.0)
MCV: 98.8 fL (ref 78.0–100.0)
Platelets: 208 10*3/uL (ref 150–400)
RBC: 3.21 MIL/uL — AB (ref 3.87–5.11)
RDW: 15.8 % — ABNORMAL HIGH (ref 11.5–15.5)
WBC: 7.2 10*3/uL (ref 4.0–10.5)

## 2014-11-22 LAB — URINALYSIS, ROUTINE W REFLEX MICROSCOPIC
BILIRUBIN URINE: NEGATIVE
Glucose, UA: 500 mg/dL — AB
Hgb urine dipstick: NEGATIVE
Ketones, ur: NEGATIVE mg/dL
Nitrite: NEGATIVE
PH: 5.5 (ref 5.0–8.0)
PROTEIN: NEGATIVE mg/dL
Specific Gravity, Urine: 1.02 (ref 1.005–1.030)
UROBILINOGEN UA: 0.2 mg/dL (ref 0.0–1.0)

## 2014-11-22 LAB — URINE MICROSCOPIC-ADD ON

## 2014-11-22 LAB — LACTATE DEHYDROGENASE: LDH: 207 U/L — AB (ref 98–192)

## 2014-11-22 MED ORDER — LIDOCAINE-PRILOCAINE 2.5-2.5 % EX CREA: 1.0000 "application " | TOPICAL_CREAM | CUTANEOUS | Status: DC | PRN

## 2014-11-22 MED ORDER — DARBEPOETIN ALFA 100 MCG/0.5ML IJ SOSY
PREFILLED_SYRINGE | INTRAMUSCULAR | Status: AC
  Filled 2014-11-22: qty 0.5

## 2014-11-22 MED ORDER — DIPHENHYDRAMINE HCL 25 MG PO CAPS: 50.0000 mg | ORAL_CAPSULE | Freq: Once | ORAL | Status: DC

## 2014-11-22 MED ORDER — SODIUM CHLORIDE 0.9 % IJ SOLN
10.0000 mL | INTRAMUSCULAR | Status: DC | PRN
  Administered 2014-11-22: 10 mL
  Filled 2014-11-22: qty 10

## 2014-11-22 MED ORDER — ACETAMINOPHEN 325 MG PO TABS
650.0000 mg | ORAL_TABLET | Freq: Once | ORAL | Status: DC
Start: 2014-11-22 — End: 2014-11-22

## 2014-11-22 MED ORDER — SODIUM CHLORIDE 0.9 % IV SOLN
Freq: Once | INTRAVENOUS | Status: AC
  Administered 2014-11-22: 09:00:00 via INTRAVENOUS

## 2014-11-22 MED ORDER — DARBEPOETIN ALFA 100 MCG/0.5ML IJ SOSY
100.0000 ug | PREFILLED_SYRINGE | Freq: Once | INTRAMUSCULAR | Status: AC
  Administered 2014-11-22: 100 ug via SUBCUTANEOUS

## 2014-11-22 MED ORDER — HEPARIN SOD (PORK) LOCK FLUSH 100 UNIT/ML IV SOLN
500.0000 [IU] | Freq: Once | INTRAVENOUS | Status: AC | PRN
  Administered 2014-11-22: 500 [IU]
  Filled 2014-11-22: qty 5

## 2014-11-22 MED ORDER — SODIUM CHLORIDE 0.9 % IV SOLN
375.0000 mg/m2 | Freq: Once | INTRAVENOUS | Status: AC
  Administered 2014-11-22: 800 mg via INTRAVENOUS
  Filled 2014-11-22: qty 80

## 2014-11-22 NOTE — Patient Instructions (Signed)
[facility] Discharge Instructions for Patients Receiving Chemotherapy  Today you received the following chemotherapy agents Rituxan. Aranesp 100 mcg injection given as ordered, Return as scheduled.  If you develop nausea and vomiting that is not controlled by your nausea medication, call the clinic. If it is after clinic hours your family physician or the after hours number for the clinic or go to the Emergency Department. BELOW ARE SYMPTOMS THAT SHOULD BE REPORTED IMMEDIATELY:  *FEVER GREATER THAN 101.0 F  *CHILLS WITH OR WITHOUT FEVER  NAUSEA AND VOMITING THAT IS NOT CONTROLLED WITH YOUR NAUSEA MEDICATION  *UNUSUAL SHORTNESS OF BREATH  *UNUSUAL BRUISING OR BLEEDING  TENDERNESS IN MOUTH AND THROAT WITH OR WITHOUT PRESENCE OF ULCERS  *URINARY PROBLEMS  *BOWEL PROBLEMS  UNUSUAL RASH Items with * indicate a potential emergency and should be followed up as soon as possible.  Return as scheduled,  I have been informed and understand all the instructions given to me. I know to contact the clinic, my physician, or go to the Emergency Department if any problems should occur. I do not have any questions at this time, but understand that I may call the clinic during office hours or the Patient Navigator at [phone] should I have any questions or need assistance in obtaining follow up care.    __________________________________________  _____________  __________ Signature of Patient or Authorized Representative            Date                   Time    __________________________________________ Nurse's Signature

## 2014-11-22 NOTE — Progress Notes (Signed)
NYLAND,LEONARD ROBERT, MD [address] [place] [address]  Follicular Lymphoma, treated with 6 cycles of treanda/rituxan. 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 female returns for follow-up of her follicular lymphoma.   She  reports burning with urination. She is sleeping and eating well. She is getting around town and goes to church. She reports abdominal wall bruising after she injects her insulin. She has a lesion on the R cheek and states she occasionally notices blood on her face when she wakes up in the morning. She also reports occasional bleeding when she yawns. Also reports non-radiating lower back pain.  Weight is stable. She is overdue for a screening mammogram.   MEDICAL HISTORY: Past Medical History  Diagnosis Date  . Hypertension   . Diabetes mellitus, type 2   . Hyperlipidemia   . Iron deficiency anemia 11/21/2012    At time of presentation.  Feraheme 1020 mg on 11/21/12.  . Follicular lymphoma 11/12/5463    Right ureteral obstruction by lymphadenopathy-> right hydronephrosis  . Port catheter in place 68/06/7516    has Follicular lymphoma; Iron deficiency anemia; Chronic kidney disease (CKD), stage IV (severe); Diabetes mellitus type 2 in obese; Hypertension; UTI (urinary tract infection); Leukocytosis, unspecified; Hypoglycemia; Port catheter in place; CKD (chronic kidney disease); Anemia; and Dysuria on her problem list.     has No Known Allergies.  Current Outpatient Prescriptions on File Prior to Visit  Medication Sig Dispense Refill  . acetaminophen (TYLENOL) 325 MG tablet Take 650 mg by mouth every 6 (six) hours as needed for pain.    Marland Kitchen  atorvastatin (LIPITOR) 80 MG tablet Take 80 mg by mouth every morning.    . furosemide (LASIX) 20 MG tablet Take 20 mg by mouth daily. One tablet 3 times a week    . insulin detemir (LEVEMIR) 100 UNIT/ML injection Inject 30 Units into the skin 2 (two) times daily. Taking 45 units in am and 35 units at night    . losartan (COZAAR) 100 MG tablet Take 100 mg by mouth daily.    . Multiple Vitamin (MULTIVITAMIN WITH MINERALS) TABS Take 1 tablet by mouth daily.    . ondansetron (ZOFRAN) 8 MG tablet Take 1 tablet (8 mg total) by mouth 2 (two) times daily. Take two times a day starting the day after chemo for 2 days. Then take two times a day as needed for nausea or vomiting. 30 tablet 1  . clotrimazole-betamethasone (LOTRISONE) cream Apply to affected area 2 times daily    . dexamethasone (DECADRON) 4 MG tablet Take 4 mg by mouth 2 (two) times daily with a meal. Take daily starting the day after chemotherapy for 2 days. Take with food.    . febuxostat (ULORIC) 40 MG tablet Take 40 mg by mouth daily.    Marland Kitchen lidocaine-prilocaine (EMLA) cream Apply 1 application topically as needed. 1 hour prior to Chemo 30 g prn  . metoCLOPramide (REGLAN) 5 MG tablet Take 2 tablets (10 mg total) by mouth 3 (three) times daily before meals. (Patient not taking: Reported on 09/27/2014) 60 tablet 6   Current Facility-Administered Medications on File Prior to Visit  Medication Dose Route Frequency Provider Last Rate Last Dose  . acetaminophen (TYLENOL) tablet 650 mg  650 mg Oral Once Patrici Ranks, MD      . Darbepoetin Alfa (ARANESP) injection 100 mcg  100 mcg Subcutaneous Once Patrici Ranks, MD      . diphenhydrAMINE (BENADRYL) capsule 50 mg  50 mg Oral Once Patrici Ranks, MD      . heparin lock flush 100 unit/mL  500 Units Intracatheter Once PRN Patrici Ranks, MD      . sodium chloride 0.9 % injection 10 mL  10 mL Intracatheter PRN Patrici Ranks, MD   10 mL at 11/22/14 0240    SURGICAL HISTORY: Past  Surgical History  Procedure Laterality Date  . Cataract extraction    . Cystoscopy w/ ureteral stent placement Right 11/03/2012    Procedure: CYSTOSCOPY WITH RETROGRADE PYELOGRAM/URETERAL STENT PLACEMENT;  Surgeon: Malka So, MD;  Location: [facility];  Service: Urology;  Laterality: Right;  . Portacath placement Left 11/03/2012    Procedure: INSERTION PORT-A-CATH;  Surgeon: Scherry Ran, MD;  Location: [facility];  Service: General;  Laterality: Left;  Insertion Port-A-Cath Left Subclavian  . Appendectomy    . Cystoscopy w/ ureteral stent placement Right 03/23/2013    Procedure: CYSTOSCOPY WITH STENT REPLACEMENT;  Surgeon: Malka So, MD;  Location: [facility];  Service: Urology;  Laterality: Right;    SOCIAL HISTORY: History   Social History  . Marital Status: Married    Spouse Name: N/A  . Number of Children: N/A  . Years of Education: N/A   Occupational History  . Not on file.   Social History Main Topics  . Smoking status: Never Smoker   . Smokeless tobacco: Never Used  . Alcohol Use: No  . Drug Use: No  . Sexual Activity: No   Other Topics Concern  . Not on file   Social History Narrative    FAMILY HISTORY: Family History  Problem Relation Age of Onset  . Cancer Father   . Cancer Brother     Review of Systems  Constitutional:Negative for fever, chills and weight loss.  HENT: Negative for congestion, hearing loss, nosebleeds, sore throat and tinnitus.   Eyes: Negative for blurred vision, double vision, pain and discharge.  Respiratory: Negative for cough, hemoptysis, sputum production, shortness of breath and wheezing.   Cardiovascular: Negative for chest pain, palpitations, claudication, leg swelling and PND.  Gastrointestinal: Negative for heartburn, vomiting, abdominal pain, constipation, blood in stool and melena. Rare loose stool and nausea Genitourinary: Negative for hematuria. Positive for dysuria Musculoskeletal: Negative for myalgias and falls. Positive  for back pain Skin: Negative for itching and rash. Positive for lesion on r cheek Neurological: Negative for dizziness, tingling, tremors, sensory change, speech change, focal weakness, seizures, loss of consciousness, weakness and headaches.  Endo/Heme/Allergies: Does not bruise/bleed easily.  Psychiatric/Behavioral: Negative for depression, suicidal ideas, memory loss and substance abuse. The patient is not nervous/anxious and does not have insomnia.     PHYSICAL EXAMINATION  ECOG PERFORMANCE STATUS: 1 - Symptomatic but completely ambulatory  Filed Vitals:   11/22/14 0820  BP: 159/57  Pulse: 70  Temp: 98.2 F (36.8 C)  Resp: 18    Physical Exam  Constitutional: She is oriented to person, place, and time and well-developed, well-nourished, and in no distress.  Obese  HENT:  Head: Normocephalic and atraumatic.  Nose: Nose normal.  Mouth/Throat: Oropharynx is clear and moist. No oropharyngeal exudate.  Eyes: Conjunctivae and EOM are normal. Pupils are equal, round, and reactive to light. Right eye exhibits no  discharge. Left eye exhibits no discharge. No scleral icterus.  Neck: Normal range of motion. Neck supple. No tracheal deviation present. No thyromegaly present.  Cardiovascular: Normal rate and regular rhythm.  Exam reveals no gallop and no friction rub.   Murmur heard. Pulmonary/Chest: Effort normal and breath sounds normal. She has no wheezes. She has no rales.  Abdominal: Soft. Bowel sounds are normal. She exhibits no distension and no mass. There is no tenderness. There is no rebound and no guarding.  Musculoskeletal: Normal range of motion. She exhibits edema.  Lymphadenopathy:    She has no cervical adenopathy.  Neurological: She is alert and oriented to person, place, and time. She has normal reflexes. No cranial nerve deficit. Gait normal. Coordination normal.  Skin: Skin is warm and dry. No rash noted.  Psychiatric: Mood, memory, affect and judgment normal.    Nursing note and vitals reviewed.   LABORATORY DATA:  CBC    Component Value Date/Time   WBC 8.2 11/08/2014 1010   RBC 3.18* 11/08/2014 1010   RBC 3.17* 07/26/2014 0956   HGB 10.0* 11/08/2014 1010   HCT 31.4* 11/08/2014 1010   PLT 214 11/08/2014 1010   MCV 98.7 11/08/2014 1010   MCH 31.4 11/08/2014 1010   MCHC 31.8 11/08/2014 1010   RDW 15.9* 11/08/2014 1010   LYMPHSABS 0.7 09/27/2014 0840   MONOABS 0.5 09/27/2014 0840   EOSABS 0.1 09/27/2014 0840   BASOSABS 0.0 09/27/2014 0840   CMP     Component Value Date/Time   NA 135 09/27/2014 0840   K 5.0 09/27/2014 0840   CL 102 09/27/2014 0840   CO2 24 09/27/2014 0840   GLUCOSE 319* 09/27/2014 0840   BUN 37* 09/27/2014 0840   CREATININE 2.22* 09/27/2014 0840   CALCIUM 9.2 09/27/2014 0840   PROT 6.4 09/27/2014 0840   ALBUMIN 3.6 09/27/2014 0840   AST 22 09/27/2014 0840   ALT 19 09/27/2014 0840   ALKPHOS 106 09/27/2014 0840   BILITOT 0.4 09/27/2014 0840   GFRNONAA 21* 09/27/2014 0840   GFRAA 24* 09/27/2014 0840     ASSESSMENT and THERAPY PLAN:   Follicular lymphoma Dysuria CKD Anemia  74 year old female with follicular lymphoma, she was treated with Treanda and Rituxan. She is currently on Rituxan maintenance. She is doing well. She will finish maintenance at the end of this year.  Will get urine sample and schedule a mammogram. I will refer her to dermatology for her face lesion.  Anemia is stable. She will continue on renal dosing of aranesp. She is due for a repeat ferritin. She will be given IV iron if needed. Goal is to maintain ferritin greater than 100.   All questions were answered. The patient knows to call the clinic with any problems, questions or concerns. We can certainly see the patient much sooner if necessary.  This document serves as a record of services personally performed by Ancil Linsey, MD. It was created on her behalf by Pearlie Oyster, a trained medical scribe. The creation of this  record is based on the scribe's personal observations and the provider's statements to them. This document has been checked and approved by the attending provider.    I have reviewed the above documentation for accuracy and completeness, and I agree with the above.  Kelby Fam. Viviene Thurston MD

## 2014-11-22 NOTE — Patient Instructions (Signed)
..  [place] at [facility] Discharge Instructions  RECOMMENDATIONS MADE BY THE CONSULTANT AND ANY TEST RESULTS WILL BE SENT TO YOUR REFERRING PHYSICIAN.  Exam with Dr. Whitney Muse. Labs today and urine test today.  Will call with results. Continue with treatment plan. Referral made to Dermatology.  Expect a phone call from them to schedule an appointment. Follow up here with PA in 2 months. Call for any new or worsening symptoms, questions, or concerns.   Thank you for choosing [place] at [facility] to provide your oncology and hematology care.  To afford each patient quality time with our provider, please arrive at least 15 minutes before your scheduled appointment time.    You need to re-schedule your appointment should you arrive 10 or more minutes late.  We strive to give you quality time with our providers, and arriving late affects you and other patients whose appointments are after yours.  Also, if you no show three or more times for appointments you may be dismissed from the clinic at the providers discretion.     Again, thank you for choosing [facility].  Our hope is that these requests will decrease the amount of time that you wait before being seen by our physicians.       _____________________________________________________________  Should you have questions after your visit to [facility], please contact our office at [phone] between the hours of 8:30 a.m. and 4:30 p.m.  Voicemails left after 4:30 p.m. will not be returned until the following business day.  For prescription refill requests, have your pharmacy contact our office.

## 2014-11-22 NOTE — Progress Notes (Signed)
[name] [name] presented for labwork. Labs per MD order drawn via Peripheral Line 23 gauge needle inserted in left antecubital.  Good blood return present. Procedure without incident.  Needle removed intact. Patient tolerated procedure well.  Patient reports she took tylenol and benadryl at home this morning prior to today's appointment.  [name] [name] presents today for injection per MD orders. Aranesp 100 mcg administered SQ in right Abdomen. Administration without incident. Patient tolerated well.  Tolerated Rituxan infusion well.

## 2014-11-22 NOTE — Progress Notes (Signed)
Labs drawn by RN 

## 2014-11-22 NOTE — Progress Notes (Signed)
Referral to Dr Allyn Kenner appt 5/18 .  Patient is aware of appt

## 2014-11-25 ENCOUNTER — Telehealth (HOSPITAL_COMMUNITY): Payer: Self-pay | Admitting: *Deleted

## 2014-11-25 MED ORDER — CIPROFLOXACIN HCL 500 MG PO TABS: 500.0000 mg | ORAL_TABLET | Freq: Every day | ORAL | Status: DC

## 2014-11-25 NOTE — Telephone Encounter (Signed)
cipro 500 mg called in to [facility]. Message left for patient to take 1 daily for 5 days

## 2014-11-25 NOTE — Telephone Encounter (Signed)
-----   Message from Patrici Ranks, MD sent at 11/24/2014  9:03 PM EDT ----- Please call in 500 mg cipro once daily for 5 days Dr.P

## 2014-11-29 ENCOUNTER — Ambulatory Visit (HOSPITAL_COMMUNITY): Payer: Medicare Other

## 2014-12-05 ENCOUNTER — Encounter (HOSPITAL_COMMUNITY): Payer: Self-pay

## 2014-12-05 ENCOUNTER — Encounter (HOSPITAL_BASED_OUTPATIENT_CLINIC_OR_DEPARTMENT_OTHER): Payer: Medicare Other

## 2014-12-05 VITALS — BP 155/44 | HR 54 | Temp 98.3°F | Resp 20

## 2014-12-05 DIAGNOSIS — C8290 Follicular lymphoma, unspecified, unspecified site: Secondary | ICD-10-CM | POA: Diagnosis not present

## 2014-12-05 DIAGNOSIS — N184 Chronic kidney disease, stage 4 (severe): Secondary | ICD-10-CM

## 2014-12-05 DIAGNOSIS — R300 Dysuria: Secondary | ICD-10-CM

## 2014-12-05 DIAGNOSIS — D509 Iron deficiency anemia, unspecified: Secondary | ICD-10-CM | POA: Diagnosis not present

## 2014-12-05 DIAGNOSIS — D631 Anemia in chronic kidney disease: Secondary | ICD-10-CM

## 2014-12-05 LAB — CBC
HCT: 31.4 % — ABNORMAL LOW (ref 36.0–46.0)
HEMOGLOBIN: 9.8 g/dL — AB (ref 12.0–15.0)
MCH: 30.9 pg (ref 26.0–34.0)
MCHC: 31.2 g/dL (ref 30.0–36.0)
MCV: 99.1 fL (ref 78.0–100.0)
Platelets: 208 10*3/uL (ref 150–400)
RBC: 3.17 MIL/uL — ABNORMAL LOW (ref 3.87–5.11)
RDW: 15.8 % — AB (ref 11.5–15.5)
WBC: 9.1 10*3/uL (ref 4.0–10.5)

## 2014-12-05 LAB — FERRITIN: Ferritin: 525 ng/mL — ABNORMAL HIGH (ref 11–307)

## 2014-12-05 MED ORDER — DARBEPOETIN ALFA 100 MCG/0.5ML IJ SOSY
PREFILLED_SYRINGE | INTRAMUSCULAR | Status: AC
  Filled 2014-12-05: qty 0.5

## 2014-12-05 MED ORDER — DARBEPOETIN ALFA 100 MCG/0.5ML IJ SOSY
100.0000 ug | PREFILLED_SYRINGE | Freq: Once | INTRAMUSCULAR | Status: AC
  Administered 2014-12-05: 100 ug via SUBCUTANEOUS

## 2014-12-05 NOTE — Progress Notes (Signed)
[name] [name]'s reason for visit today is for an injection and labs as scheduled per MD orders.  Labs were drawn prior to administration of ordered medication.   [name] also received aranesp in abdomen sq per MD orders; see [facility] for administration details.  [name] tolerated all procedures well and without incident; questions were answered and patient was discharged.

## 2014-12-05 NOTE — Progress Notes (Signed)
LABS DRAWN

## 2014-12-05 NOTE — Patient Instructions (Signed)
[place] at [facility] Discharge Instructions  RECOMMENDATIONS MADE BY THE CONSULTANT AND ANY TEST RESULTS WILL BE SENT TO YOUR REFERRING PHYSICIAN.  aranesp injection today Hemoglobin 9.8 Follow up as scheduled Call the clinic if you have nay questions or concerns  Thank you for choosing [place] at [facility] to provide your oncology and hematology care.  To afford each patient quality time with our provider, please arrive at least 15 minutes before your scheduled appointment time.    You need to re-schedule your appointment should you arrive 10 or more minutes late.  We strive to give you quality time with our providers, and arriving late affects you and other patients whose appointments are after yours.  Also, if you no show three or more times for appointments you may be dismissed from the clinic at the providers discretion.     Again, thank you for choosing [facility].  Our hope is that these requests will decrease the amount of time that you wait before being seen by our physicians.       _____________________________________________________________  Should you have questions after your visit to [facility], please contact our office at [phone] between the hours of 8:30 a.m. and 4:30 p.m.  Voicemails left after 4:30 p.m. will not be returned until the following business day.  For prescription refill requests, have your pharmacy contact our office.

## 2014-12-06 ENCOUNTER — Ambulatory Visit (HOSPITAL_COMMUNITY): Payer: Medicare Other

## 2014-12-06 ENCOUNTER — Other Ambulatory Visit (HOSPITAL_COMMUNITY): Payer: Medicare Other

## 2014-12-08 ENCOUNTER — Other Ambulatory Visit (HOSPITAL_COMMUNITY): Payer: Self-pay | Admitting: Hematology & Oncology

## 2014-12-13 ENCOUNTER — Ambulatory Visit (HOSPITAL_COMMUNITY): Payer: Medicare Other | Admitting: Oncology

## 2014-12-17 ENCOUNTER — Ambulatory Visit (HOSPITAL_COMMUNITY)
Admission: RE | Admit: 2014-12-17 | Discharge: 2014-12-17 | Disposition: A | Discharge status: Home / Self Care | Payer: Medicare Other | Source: Ambulatory Visit | Attending: Hematology & Oncology | Admitting: Hematology & Oncology

## 2014-12-17 DIAGNOSIS — N6489 Other specified disorders of breast: Secondary | ICD-10-CM | POA: Insufficient documentation

## 2014-12-17 DIAGNOSIS — Z09 Encounter for follow-up examination after completed treatment for conditions other than malignant neoplasm: Secondary | ICD-10-CM

## 2014-12-20 ENCOUNTER — Other Ambulatory Visit (HOSPITAL_COMMUNITY): Payer: Medicare Other

## 2014-12-20 ENCOUNTER — Encounter (HOSPITAL_BASED_OUTPATIENT_CLINIC_OR_DEPARTMENT_OTHER): Payer: Medicare Other

## 2014-12-20 ENCOUNTER — Encounter (HOSPITAL_COMMUNITY): Payer: Medicare Other | Attending: Oncology

## 2014-12-20 ENCOUNTER — Ambulatory Visit (HOSPITAL_COMMUNITY): Payer: Medicare Other

## 2014-12-20 VITALS — BP 185/51 | HR 53 | Temp 98.2°F | Resp 20

## 2014-12-20 DIAGNOSIS — N184 Chronic kidney disease, stage 4 (severe): Secondary | ICD-10-CM | POA: Insufficient documentation

## 2014-12-20 DIAGNOSIS — R300 Dysuria: Secondary | ICD-10-CM | POA: Diagnosis present

## 2014-12-20 DIAGNOSIS — D631 Anemia in chronic kidney disease: Secondary | ICD-10-CM

## 2014-12-20 DIAGNOSIS — C8290 Follicular lymphoma, unspecified, unspecified site: Secondary | ICD-10-CM | POA: Insufficient documentation

## 2014-12-20 LAB — CBC
HCT: 32.3 % — ABNORMAL LOW (ref 36.0–46.0)
HEMOGLOBIN: 10.1 g/dL — AB (ref 12.0–15.0)
MCH: 31 pg (ref 26.0–34.0)
MCHC: 31.3 g/dL (ref 30.0–36.0)
MCV: 99.1 fL (ref 78.0–100.0)
Platelets: 239 10*3/uL (ref 150–400)
RBC: 3.26 MIL/uL — ABNORMAL LOW (ref 3.87–5.11)
RDW: 15.6 % — ABNORMAL HIGH (ref 11.5–15.5)
WBC: 9.1 10*3/uL (ref 4.0–10.5)

## 2014-12-20 MED ORDER — DARBEPOETIN ALFA 150 MCG/0.3ML IJ SOSY
125.0000 ug | PREFILLED_SYRINGE | Freq: Once | INTRAMUSCULAR | Status: AC
  Administered 2014-12-20: 125 ug via SUBCUTANEOUS
  Filled 2014-12-20: qty 0.3

## 2014-12-20 NOTE — Progress Notes (Signed)
LABS DRAWN

## 2014-12-20 NOTE — Progress Notes (Signed)
..  [name] [name] presents today for injection per the provider's orders.  aranesp administration without incident; see MAR for injection details.  Patient tolerated procedure well and without incident.  No questions or complaints noted at this time.

## 2015-01-03 ENCOUNTER — Encounter (HOSPITAL_BASED_OUTPATIENT_CLINIC_OR_DEPARTMENT_OTHER): Payer: Medicare Other

## 2015-01-03 VITALS — BP 156/50 | HR 61 | Temp 98.2°F | Resp 20

## 2015-01-03 DIAGNOSIS — D631 Anemia in chronic kidney disease: Secondary | ICD-10-CM

## 2015-01-03 DIAGNOSIS — R300 Dysuria: Secondary | ICD-10-CM | POA: Diagnosis not present

## 2015-01-03 DIAGNOSIS — N184 Chronic kidney disease, stage 4 (severe): Secondary | ICD-10-CM

## 2015-01-03 LAB — CBC
HCT: 32 % — ABNORMAL LOW (ref 36.0–46.0)
Hemoglobin: 10 g/dL — ABNORMAL LOW (ref 12.0–15.0)
MCH: 31.1 pg (ref 26.0–34.0)
MCHC: 31.3 g/dL (ref 30.0–36.0)
MCV: 99.4 fL (ref 78.0–100.0)
Platelets: 233 10*3/uL (ref 150–400)
RBC: 3.22 MIL/uL — ABNORMAL LOW (ref 3.87–5.11)
RDW: 15.7 % — ABNORMAL HIGH (ref 11.5–15.5)
WBC: 9.4 10*3/uL (ref 4.0–10.5)

## 2015-01-03 MED ORDER — DARBEPOETIN ALFA 150 MCG/0.3ML IJ SOSY
125.0000 ug | PREFILLED_SYRINGE | Freq: Once | INTRAMUSCULAR | Status: AC
  Administered 2015-01-03: 125 ug via SUBCUTANEOUS

## 2015-01-03 MED ORDER — DARBEPOETIN ALFA 150 MCG/0.3ML IJ SOSY
PREFILLED_SYRINGE | INTRAMUSCULAR | Status: AC
  Filled 2015-01-03: qty 0.3

## 2015-01-03 NOTE — Progress Notes (Signed)
[name] presents today for injection per MD orders. Aranesp 127mcg administered SQ in right Abdomen. Administration without incident. Patient tolerated well.

## 2015-01-03 NOTE — Progress Notes (Signed)
Labs drawn

## 2015-01-17 ENCOUNTER — Encounter (HOSPITAL_BASED_OUTPATIENT_CLINIC_OR_DEPARTMENT_OTHER): Payer: Medicare Other | Admitting: Oncology

## 2015-01-17 ENCOUNTER — Encounter (HOSPITAL_COMMUNITY): Payer: Medicare Other | Attending: Oncology

## 2015-01-17 ENCOUNTER — Encounter (HOSPITAL_COMMUNITY): Payer: Self-pay | Admitting: Oncology

## 2015-01-17 VITALS — BP 158/45 | HR 68 | Temp 98.0°F | Resp 16 | Wt 230.0 lb

## 2015-01-17 VITALS — BP 152/56 | HR 71 | Temp 97.6°F | Resp 18

## 2015-01-17 DIAGNOSIS — D509 Iron deficiency anemia, unspecified: Secondary | ICD-10-CM | POA: Insufficient documentation

## 2015-01-17 DIAGNOSIS — Z794 Long term (current) use of insulin: Secondary | ICD-10-CM | POA: Diagnosis not present

## 2015-01-17 DIAGNOSIS — E119 Type 2 diabetes mellitus without complications: Secondary | ICD-10-CM | POA: Insufficient documentation

## 2015-01-17 DIAGNOSIS — D631 Anemia in chronic kidney disease: Secondary | ICD-10-CM | POA: Insufficient documentation

## 2015-01-17 DIAGNOSIS — C8290 Follicular lymphoma, unspecified, unspecified site: Secondary | ICD-10-CM

## 2015-01-17 DIAGNOSIS — N184 Chronic kidney disease, stage 4 (severe): Secondary | ICD-10-CM

## 2015-01-17 DIAGNOSIS — Z5112 Encounter for antineoplastic immunotherapy: Secondary | ICD-10-CM

## 2015-01-17 DIAGNOSIS — I129 Hypertensive chronic kidney disease with stage 1 through stage 4 chronic kidney disease, or unspecified chronic kidney disease: Secondary | ICD-10-CM | POA: Insufficient documentation

## 2015-01-17 DIAGNOSIS — Z79899 Other long term (current) drug therapy: Secondary | ICD-10-CM | POA: Insufficient documentation

## 2015-01-17 DIAGNOSIS — E785 Hyperlipidemia, unspecified: Secondary | ICD-10-CM | POA: Insufficient documentation

## 2015-01-17 LAB — CBC WITH DIFFERENTIAL/PLATELET
BASOS ABS: 0 10*3/uL (ref 0.0–0.1)
BASOS PCT: 0 % (ref 0–1)
EOS ABS: 0.2 10*3/uL (ref 0.0–0.7)
EOS PCT: 3 % (ref 0–5)
HEMATOCRIT: 30.7 % — AB (ref 36.0–46.0)
Hemoglobin: 9.8 g/dL — ABNORMAL LOW (ref 12.0–15.0)
Lymphocytes Relative: 11 % — ABNORMAL LOW (ref 12–46)
Lymphs Abs: 0.8 10*3/uL (ref 0.7–4.0)
MCH: 31.5 pg (ref 26.0–34.0)
MCHC: 31.9 g/dL (ref 30.0–36.0)
MCV: 98.7 fL (ref 78.0–100.0)
MONO ABS: 0.6 10*3/uL (ref 0.1–1.0)
MONOS PCT: 8 % (ref 3–12)
Neutro Abs: 5.9 10*3/uL (ref 1.7–7.7)
Neutrophils Relative %: 78 % — ABNORMAL HIGH (ref 43–77)
Platelets: 204 10*3/uL (ref 150–400)
RBC: 3.11 MIL/uL — ABNORMAL LOW (ref 3.87–5.11)
RDW: 16.1 % — AB (ref 11.5–15.5)
WBC: 7.5 10*3/uL (ref 4.0–10.5)

## 2015-01-17 LAB — COMPREHENSIVE METABOLIC PANEL
ALT: 16 U/L (ref 14–54)
ANION GAP: 10 (ref 5–15)
AST: 19 U/L (ref 15–41)
Albumin: 3.5 g/dL (ref 3.5–5.0)
Alkaline Phosphatase: 93 U/L (ref 38–126)
BUN: 40 mg/dL — ABNORMAL HIGH (ref 6–20)
CO2: 24 mmol/L (ref 22–32)
Calcium: 8.8 mg/dL — ABNORMAL LOW (ref 8.9–10.3)
Chloride: 105 mmol/L (ref 101–111)
Creatinine, Ser: 2.24 mg/dL — ABNORMAL HIGH (ref 0.44–1.00)
GFR, EST AFRICAN AMERICAN: 24 mL/min — AB (ref >60)
GFR, EST NON AFRICAN AMERICAN: 21 mL/min — AB (ref >60)
GLUCOSE: 193 mg/dL — AB (ref 65–99)
POTASSIUM: 4.5 mmol/L (ref 3.5–5.1)
Sodium: 139 mmol/L (ref 135–145)
TOTAL PROTEIN: 6.2 g/dL — AB (ref 6.5–8.1)
Total Bilirubin: 0.5 mg/dL (ref 0.3–1.2)

## 2015-01-17 LAB — FERRITIN: Ferritin: 336 ng/mL — ABNORMAL HIGH (ref 11–307)

## 2015-01-17 LAB — LACTATE DEHYDROGENASE: LDH: 211 U/L — ABNORMAL HIGH (ref 98–192)

## 2015-01-17 MED ORDER — DIPHENHYDRAMINE HCL 25 MG PO CAPS
50.0000 mg | ORAL_CAPSULE | Freq: Once | ORAL | Status: AC
  Administered 2015-01-17: 50 mg via ORAL
  Filled 2015-01-17: qty 2

## 2015-01-17 MED ORDER — HEPARIN SOD (PORK) LOCK FLUSH 100 UNIT/ML IV SOLN
500.0000 [IU] | Freq: Once | INTRAVENOUS | Status: AC | PRN
  Administered 2015-01-17: 500 [IU]
  Filled 2015-01-17: qty 5

## 2015-01-17 MED ORDER — SODIUM CHLORIDE 0.9 % IV SOLN
Freq: Once | INTRAVENOUS | Status: AC
  Administered 2015-01-17: 10:00:00 via INTRAVENOUS

## 2015-01-17 MED ORDER — ACETAMINOPHEN 325 MG PO TABS
650.0000 mg | ORAL_TABLET | Freq: Once | ORAL | Status: DC
  Filled 2015-01-17: qty 2

## 2015-01-17 MED ORDER — DARBEPOETIN ALFA 150 MCG/0.3ML IJ SOSY
125.0000 ug | PREFILLED_SYRINGE | Freq: Once | INTRAMUSCULAR | Status: AC
Start: 2015-01-17 — End: 2015-01-17
  Administered 2015-01-17: 125 ug via SUBCUTANEOUS
  Filled 2015-01-17: qty 0.3

## 2015-01-17 MED ORDER — SODIUM CHLORIDE 0.9 % IJ SOLN: 10.0000 mL | INTRAMUSCULAR | Status: DC | PRN

## 2015-01-17 MED ORDER — RITUXIMAB CHEMO INJECTION 500 MG/50ML
375.0000 mg/m2 | Freq: Once | INTRAVENOUS | Status: AC
  Administered 2015-01-17: 800 mg via INTRAVENOUS
  Filled 2015-01-17: qty 70

## 2015-01-17 NOTE — Patient Instructions (Addendum)
[place] at [facility] Discharge Instructions  RECOMMENDATIONS MADE BY THE CONSULTANT AND ANY TEST RESULTS WILL BE SENT TO YOUR REFERRING PHYSICIAN.  Rituxan infusion today. Aranesp injection today (hemoglobin 9.8). Exam and discussion today with Kirby Crigler, PA-C. Return as scheduled every 2 weeks for labs and injection. Rituxan infusion in 2 months. Office visit in 2 months. Follow up with dermatologist as directed.  Thank you for choosing [place] at [facility] to provide your oncology and hematology care.  To afford each patient quality time with our provider, please arrive at least 15 minutes before your scheduled appointment time.    You need to re-schedule your appointment should you arrive 10 or more minutes late.  We strive to give you quality time with our providers, and arriving late affects you and other patients whose appointments are after yours.  Also, if you no show three or more times for appointments you may be dismissed from the clinic at the providers discretion.     Again, thank you for choosing [facility].  Our hope is that these requests will decrease the amount of time that you wait before being seen by our physicians.       _____________________________________________________________  Should you have questions after your visit to [facility], please contact our office at [phone] between the hours of 8:30 a.m. and 4:30 p.m.  Voicemails left after 4:30 p.m. will not be returned until the following business day.  For prescription refill requests, have your pharmacy contact our office.

## 2015-01-17 NOTE — Progress Notes (Signed)
Harvin Hazel Wahler's reason for visit today is for an injection and labs as scheduled per MD orders.  Labs were drawn prior to administration of ordered medication.    [name] [name] also received aranesp 125 mcg per MD orders; see [facility] for administration details.  [name] [name] tolerated all procedures well and without incident; questions were answered and patient was discharged.   [name] [name] Tolerated chemotherapy well today Discharged ambulatory

## 2015-01-17 NOTE — Assessment & Plan Note (Signed)
On 125 mcg of Aranesp every 2 weeks.  Will continue with this supportive therapy plan and adjust the dose accordingly.  Labs every 2 weeks: CBC  Ferritin every 4-6 weeks.
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
## 2015-01-17 NOTE — Patient Instructions (Signed)
[place] at [facility] Discharge Instructions  RECOMMENDATIONS MADE BY THE CONSULTANT AND ANY TEST RESULTS WILL BE SENT TO YOUR REFERRING PHYSICIAN.  Rituxan infusion today. Aranesp injection today (hemoglobin 9.8). Exam and discussion today with Kirby Crigler, PA-C. Return as scheduled every 2 weeks for labs and injection. Rituxan infusion in 2 months. Office visit in 2 months. Follow up with dermatologist as directed.  Thank you for choosing [place] at [facility] to provide your oncology and hematology care.  To afford each patient quality time with our provider, please arrive at least 15 minutes before your scheduled appointment time.    You need to re-schedule your appointment should you arrive 10 or more minutes late.  We strive to give you quality time with our providers, and arriving late affects you and other patients whose appointments are after yours.  Also, if you no show three or more times for appointments you may be dismissed from the clinic at the providers discretion.     Again, thank you for choosing [facility]  Our hope is that these requests will decrease the amount of time that you wait before being seen by our physicians.       _____________________________________________________________  Should you have questions after your visit to [facility], please contact our office at [phone] between the hours of 8:30 a.m. and 4:30 p.m.  Voicemails left after 4:30 p.m. will not be returned until the following business day.  For prescription refill requests, have your pharmacy contact our office.

## 2015-01-17 NOTE — Assessment & Plan Note (Addendum)
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  Oncology history developed.  Additional Rituxan antibody cycles built to complete 2 years worth of therapy following 6 cycles of BR.  Labs in 2 months: CBC diff, CMET, LDH  Pre-chemo labs today as planned.  Return in 2 months for follow-up and next treatment of Rituxan.  Continue follow-up with Dr. Nevada Crane (Derm) as directed.

## 2015-01-21 ENCOUNTER — Other Ambulatory Visit (HOSPITAL_COMMUNITY): Payer: Self-pay | Admitting: Oncology

## 2015-01-21 DIAGNOSIS — E669 Obesity, unspecified: Principal | ICD-10-CM

## 2015-01-21 DIAGNOSIS — E1169 Type 2 diabetes mellitus with other specified complication: Secondary | ICD-10-CM

## 2015-01-21 MED ORDER — METOCLOPRAMIDE HCL 5 MG PO TABS: 10.0000 mg | ORAL_TABLET | Freq: Three times a day (TID) | ORAL | Status: DC

## 2015-01-24 ENCOUNTER — Inpatient Hospital Stay (HOSPITAL_COMMUNITY): Payer: Medicare Other

## 2015-01-29 ENCOUNTER — Other Ambulatory Visit (HOSPITAL_COMMUNITY): Payer: Self-pay

## 2015-01-31 ENCOUNTER — Encounter (HOSPITAL_BASED_OUTPATIENT_CLINIC_OR_DEPARTMENT_OTHER): Payer: Medicare Other

## 2015-01-31 ENCOUNTER — Encounter (HOSPITAL_COMMUNITY): Payer: Self-pay

## 2015-01-31 VITALS — BP 162/64 | HR 76 | Temp 98.7°F | Resp 18

## 2015-01-31 DIAGNOSIS — D631 Anemia in chronic kidney disease: Secondary | ICD-10-CM | POA: Diagnosis not present

## 2015-01-31 DIAGNOSIS — N184 Chronic kidney disease, stage 4 (severe): Secondary | ICD-10-CM

## 2015-01-31 DIAGNOSIS — C8290 Follicular lymphoma, unspecified, unspecified site: Secondary | ICD-10-CM | POA: Diagnosis not present

## 2015-01-31 LAB — CBC
HCT: 32.4 % — ABNORMAL LOW (ref 36.0–46.0)
Hemoglobin: 10.3 g/dL — ABNORMAL LOW (ref 12.0–15.0)
MCH: 31.3 pg (ref 26.0–34.0)
MCHC: 31.8 g/dL (ref 30.0–36.0)
MCV: 98.5 fL (ref 78.0–100.0)
Platelets: 236 10*3/uL (ref 150–400)
RBC: 3.29 MIL/uL — AB (ref 3.87–5.11)
RDW: 15.9 % — AB (ref 11.5–15.5)
WBC: 9.7 10*3/uL (ref 4.0–10.5)

## 2015-01-31 MED ORDER — DARBEPOETIN ALFA 150 MCG/0.3ML IJ SOSY
125.0000 ug | PREFILLED_SYRINGE | Freq: Once | INTRAMUSCULAR | Status: AC
  Administered 2015-01-31: 125 ug via SUBCUTANEOUS

## 2015-01-31 MED ORDER — DARBEPOETIN ALFA 150 MCG/0.3ML IJ SOSY
PREFILLED_SYRINGE | INTRAMUSCULAR | Status: AC
  Filled 2015-01-31: qty 0.3

## 2015-01-31 NOTE — Progress Notes (Signed)
..  [name] [name] presents today for injection per the provider's orders.  aranesp administration without incident; see MAR for injection details.  Patient tolerated procedure well and without incident.  No questions or complaints noted at this time.

## 2015-02-03 NOTE — Progress Notes (Signed)
Lab draw

## 2015-02-14 ENCOUNTER — Encounter (HOSPITAL_BASED_OUTPATIENT_CLINIC_OR_DEPARTMENT_OTHER): Payer: Medicare Other

## 2015-02-14 ENCOUNTER — Encounter (HOSPITAL_COMMUNITY): Payer: Medicare Other | Attending: Oncology

## 2015-02-14 VITALS — BP 173/61 | HR 70 | Temp 98.1°F | Resp 20

## 2015-02-14 DIAGNOSIS — C8290 Follicular lymphoma, unspecified, unspecified site: Secondary | ICD-10-CM | POA: Insufficient documentation

## 2015-02-14 DIAGNOSIS — D631 Anemia in chronic kidney disease: Secondary | ICD-10-CM

## 2015-02-14 DIAGNOSIS — R300 Dysuria: Secondary | ICD-10-CM | POA: Insufficient documentation

## 2015-02-14 DIAGNOSIS — N184 Chronic kidney disease, stage 4 (severe): Secondary | ICD-10-CM

## 2015-02-14 LAB — CBC
HEMATOCRIT: 32.4 % — AB (ref 36.0–46.0)
HEMOGLOBIN: 10.3 g/dL — AB (ref 12.0–15.0)
MCH: 31.4 pg (ref 26.0–34.0)
MCHC: 31.8 g/dL (ref 30.0–36.0)
MCV: 98.8 fL (ref 78.0–100.0)
Platelets: 235 10*3/uL (ref 150–400)
RBC: 3.28 MIL/uL — ABNORMAL LOW (ref 3.87–5.11)
RDW: 16 % — AB (ref 11.5–15.5)
WBC: 8.5 10*3/uL (ref 4.0–10.5)

## 2015-02-14 LAB — FERRITIN: FERRITIN: 221 ng/mL (ref 11–307)

## 2015-02-14 MED ORDER — DARBEPOETIN ALFA 150 MCG/0.3ML IJ SOSY
PREFILLED_SYRINGE | INTRAMUSCULAR | Status: AC
  Filled 2015-02-14: qty 0.3

## 2015-02-14 MED ORDER — DARBEPOETIN ALFA 150 MCG/0.3ML IJ SOSY
125.0000 ug | PREFILLED_SYRINGE | Freq: Once | INTRAMUSCULAR | Status: AC
  Administered 2015-02-14: 125 ug via SUBCUTANEOUS

## 2015-02-14 NOTE — Patient Instructions (Signed)
[place] at [facility] Discharge Instructions  RECOMMENDATIONS MADE BY THE CONSULTANT AND ANY TEST RESULTS WILL BE SENT TO YOUR REFERRING PHYSICIAN.  Hemoglobin 10.3 today. Aranesp 125 mcg injection given as ordered. Return as scheduled.  Thank you for choosing [place] at [facility] to provide your oncology and hematology care.  To afford each patient quality time with our provider, please arrive at least 15 minutes before your scheduled appointment time.    You need to re-schedule your appointment should you arrive 10 or more minutes late.  We strive to give you quality time with our providers, and arriving late affects you and other patients whose appointments are after yours.  Also, if you no show three or more times for appointments you may be dismissed from the clinic at the providers discretion.     Again, thank you for choosing [facility].  Our hope is that these requests will decrease the amount of time that you wait before being seen by our physicians.       _____________________________________________________________  Should you have questions after your visit to [facility], please contact our office at [phone] between the hours of 8:30 a.m. and 4:30 p.m.  Voicemails left after 4:30 p.m. will not be returned until the following business day.  For prescription refill requests, have your pharmacy contact our office.

## 2015-02-14 NOTE — Progress Notes (Signed)
Labs drawn

## 2015-02-14 NOTE — Progress Notes (Signed)
[name] [name] presents today for injection per MD orders. Aranesp 125 mcg administered SQ in left Abdomen. Administration without incident. Patient tolerated well.

## 2015-02-25 ENCOUNTER — Other Ambulatory Visit (HOSPITAL_COMMUNITY): Payer: Self-pay

## 2015-02-28 ENCOUNTER — Encounter (HOSPITAL_BASED_OUTPATIENT_CLINIC_OR_DEPARTMENT_OTHER): Payer: Medicare Other

## 2015-02-28 ENCOUNTER — Encounter (HOSPITAL_COMMUNITY): Payer: Self-pay

## 2015-02-28 VITALS — BP 160/52 | HR 58 | Temp 98.0°F | Resp 20

## 2015-02-28 DIAGNOSIS — N184 Chronic kidney disease, stage 4 (severe): Secondary | ICD-10-CM | POA: Diagnosis not present

## 2015-02-28 DIAGNOSIS — D631 Anemia in chronic kidney disease: Secondary | ICD-10-CM

## 2015-02-28 DIAGNOSIS — R300 Dysuria: Secondary | ICD-10-CM | POA: Diagnosis not present

## 2015-02-28 LAB — CBC
HCT: 31.8 % — ABNORMAL LOW (ref 36.0–46.0)
Hemoglobin: 10.1 g/dL — ABNORMAL LOW (ref 12.0–15.0)
MCH: 31.6 pg (ref 26.0–34.0)
MCHC: 31.8 g/dL (ref 30.0–36.0)
MCV: 99.4 fL (ref 78.0–100.0)
PLATELETS: 238 10*3/uL (ref 150–400)
RBC: 3.2 MIL/uL — ABNORMAL LOW (ref 3.87–5.11)
RDW: 15.7 % — ABNORMAL HIGH (ref 11.5–15.5)
WBC: 8.5 10*3/uL (ref 4.0–10.5)

## 2015-02-28 MED ORDER — DARBEPOETIN ALFA 150 MCG/0.3ML IJ SOSY
PREFILLED_SYRINGE | INTRAMUSCULAR | Status: AC
  Filled 2015-02-28: qty 0.3

## 2015-02-28 MED ORDER — DARBEPOETIN ALFA 150 MCG/0.3ML IJ SOSY
125.0000 ug | PREFILLED_SYRINGE | Freq: Once | INTRAMUSCULAR | Status: AC
  Administered 2015-02-28: 125 ug via SUBCUTANEOUS

## 2015-02-28 NOTE — Patient Instructions (Signed)
[place] at [facility] Discharge Instructions  RECOMMENDATIONS MADE BY THE CONSULTANT AND ANY TEST RESULTS WILL BE SENT TO YOUR REFERRING PHYSICIAN.  Aranesp injection today. Return as scheduled for lab work and injections. Return as scheduled for Rituxan infusion and office visit.  Thank you for choosing [place] at [facility] to provide your oncology and hematology care.  To afford each patient quality time with our provider, please arrive at least 15 minutes before your scheduled appointment time.    You need to re-schedule your appointment should you arrive 10 or more minutes late.  We strive to give you quality time with our providers, and arriving late affects you and other patients whose appointments are after yours.  Also, if you no show three or more times for appointments you may be dismissed from the clinic at the providers discretion.     Again, thank you for choosing [facility].  Our hope is that these requests will decrease the amount of time that you wait before being seen by our physicians.       _____________________________________________________________  Should you have questions after your visit to [facility], please contact our office at [phone] between the hours of 8:30 a.m. and 4:30 p.m.  Voicemails left after 4:30 p.m. will not be returned until the following business day.  For prescription refill requests, have your pharmacy contact our office.

## 2015-02-28 NOTE — Progress Notes (Signed)
[name] [name] presents today for injection per MD orders. Aranesp 159mcg administered SQ in right Abdomen. Administration without incident. Patient tolerated well.

## 2015-03-03 NOTE — Progress Notes (Signed)
Labs drawn

## 2015-03-14 ENCOUNTER — Encounter (HOSPITAL_BASED_OUTPATIENT_CLINIC_OR_DEPARTMENT_OTHER): Payer: Medicare Other | Admitting: Oncology

## 2015-03-14 ENCOUNTER — Encounter (HOSPITAL_COMMUNITY): Payer: Medicare Other

## 2015-03-14 ENCOUNTER — Encounter (HOSPITAL_BASED_OUTPATIENT_CLINIC_OR_DEPARTMENT_OTHER): Payer: Medicare Other

## 2015-03-14 ENCOUNTER — Encounter (HOSPITAL_COMMUNITY): Payer: Medicare Other | Attending: Oncology

## 2015-03-14 ENCOUNTER — Encounter (HOSPITAL_COMMUNITY): Payer: Self-pay | Admitting: Oncology

## 2015-03-14 VITALS — BP 154/92 | HR 60 | Temp 97.8°F | Resp 16

## 2015-03-14 VITALS — BP 149/52 | HR 74 | Temp 97.9°F | Resp 18 | Wt 230.6 lb

## 2015-03-14 DIAGNOSIS — R300 Dysuria: Secondary | ICD-10-CM | POA: Insufficient documentation

## 2015-03-14 DIAGNOSIS — D631 Anemia in chronic kidney disease: Secondary | ICD-10-CM

## 2015-03-14 DIAGNOSIS — Z5112 Encounter for antineoplastic immunotherapy: Secondary | ICD-10-CM

## 2015-03-14 DIAGNOSIS — C8290 Follicular lymphoma, unspecified, unspecified site: Secondary | ICD-10-CM

## 2015-03-14 DIAGNOSIS — N184 Chronic kidney disease, stage 4 (severe): Secondary | ICD-10-CM

## 2015-03-14 LAB — COMPREHENSIVE METABOLIC PANEL
ALBUMIN: 3.6 g/dL (ref 3.5–5.0)
ALT: 15 U/L (ref 14–54)
ANION GAP: 7 (ref 5–15)
AST: 19 U/L (ref 15–41)
Alkaline Phosphatase: 92 U/L (ref 38–126)
BUN: 44 mg/dL — ABNORMAL HIGH (ref 6–20)
CHLORIDE: 103 mmol/L (ref 101–111)
CO2: 27 mmol/L (ref 22–32)
CREATININE: 2.25 mg/dL — AB (ref 0.44–1.00)
Calcium: 8.3 mg/dL — ABNORMAL LOW (ref 8.9–10.3)
GFR calc non Af Amer: 20 mL/min — ABNORMAL LOW (ref >60)
GFR, EST AFRICAN AMERICAN: 24 mL/min — AB (ref >60)
Glucose, Bld: 198 mg/dL — ABNORMAL HIGH (ref 65–99)
Potassium: 4.3 mmol/L (ref 3.5–5.1)
SODIUM: 137 mmol/L (ref 135–145)
Total Bilirubin: 0.4 mg/dL (ref 0.3–1.2)
Total Protein: 6.4 g/dL — ABNORMAL LOW (ref 6.5–8.1)

## 2015-03-14 LAB — CBC WITH DIFFERENTIAL/PLATELET
Basophils Absolute: 0 K/uL (ref 0.0–0.1)
Basophils Relative: 0 % (ref 0–1)
Eosinophils Absolute: 0.3 K/uL (ref 0.0–0.7)
Eosinophils Relative: 4 % (ref 0–5)
HCT: 32.2 % — ABNORMAL LOW (ref 36.0–46.0)
Hemoglobin: 10.1 g/dL — ABNORMAL LOW (ref 12.0–15.0)
Lymphocytes Relative: 11 % — ABNORMAL LOW (ref 12–46)
Lymphs Abs: 0.9 K/uL (ref 0.7–4.0)
MCH: 30.8 pg (ref 26.0–34.0)
MCHC: 31.4 g/dL (ref 30.0–36.0)
MCV: 98.2 fL (ref 78.0–100.0)
Monocytes Absolute: 0.5 K/uL (ref 0.1–1.0)
Monocytes Relative: 6 % (ref 3–12)
Neutro Abs: 6.4 K/uL (ref 1.7–7.7)
Neutrophils Relative %: 79 % — ABNORMAL HIGH (ref 43–77)
Platelets: 227 K/uL (ref 150–400)
RBC: 3.28 MIL/uL — ABNORMAL LOW (ref 3.87–5.11)
RDW: 16.4 % — ABNORMAL HIGH (ref 11.5–15.5)
WBC: 8.1 K/uL (ref 4.0–10.5)

## 2015-03-14 LAB — LACTATE DEHYDROGENASE: LDH: 222 U/L — ABNORMAL HIGH (ref 98–192)

## 2015-03-14 MED ORDER — SODIUM CHLORIDE 0.9 % IJ SOLN
10.0000 mL | INTRAMUSCULAR | Status: DC | PRN
  Administered 2015-03-14: 10 mL
  Filled 2015-03-14: qty 10

## 2015-03-14 MED ORDER — DARBEPOETIN ALFA 150 MCG/0.3ML IJ SOSY
125.0000 ug | PREFILLED_SYRINGE | Freq: Once | INTRAMUSCULAR | Status: AC
  Administered 2015-03-14: 125 ug via SUBCUTANEOUS

## 2015-03-14 MED ORDER — ACETAMINOPHEN 325 MG PO TABS
650.0000 mg | ORAL_TABLET | Freq: Once | ORAL | Status: AC
  Administered 2015-03-14: 650 mg via ORAL

## 2015-03-14 MED ORDER — DIPHENHYDRAMINE HCL 25 MG PO CAPS
ORAL_CAPSULE | ORAL | Status: AC
  Filled 2015-03-14: qty 2

## 2015-03-14 MED ORDER — ACETAMINOPHEN 325 MG PO TABS
ORAL_TABLET | ORAL | Status: AC
  Filled 2015-03-14: qty 2

## 2015-03-14 MED ORDER — SODIUM CHLORIDE 0.9 % IV SOLN
Freq: Once | INTRAVENOUS | Status: AC
  Administered 2015-03-14: 10:00:00 via INTRAVENOUS

## 2015-03-14 MED ORDER — DARBEPOETIN ALFA 150 MCG/0.3ML IJ SOSY
PREFILLED_SYRINGE | INTRAMUSCULAR | Status: AC
  Filled 2015-03-14: qty 0.3

## 2015-03-14 MED ORDER — DIPHENHYDRAMINE HCL 25 MG PO CAPS
50.0000 mg | ORAL_CAPSULE | Freq: Once | ORAL | Status: AC
  Administered 2015-03-14: 50 mg via ORAL

## 2015-03-14 MED ORDER — HEPARIN SOD (PORK) LOCK FLUSH 100 UNIT/ML IV SOLN
500.0000 [IU] | Freq: Once | INTRAVENOUS | Status: AC | PRN
  Administered 2015-03-14: 500 [IU]
  Filled 2015-03-14: qty 5

## 2015-03-14 MED ORDER — SODIUM CHLORIDE 0.9 % IV SOLN
375.0000 mg/m2 | Freq: Once | INTRAVENOUS | Status: AC
  Administered 2015-03-14: 800 mg via INTRAVENOUS
  Filled 2015-03-14: qty 70
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
## 2015-03-14 NOTE — Assessment & Plan Note (Addendum)
On 125 mcg of Aranesp every 2 weeks.  Will continue with this supportive therapy plan and adjust the dose accordingly.  Aranesp injection today.  Labs every 2 weeks: CBC  Ferritin every 4-6 weeks.

## 2015-03-14 NOTE — Patient Instructions (Signed)
[facility] Discharge Instructions for Patients Receiving Chemotherapy  Today you received the following chemotherapy agents:  Rituxan Aranesp injection today. Return as scheduled for lab work and injections. Return as scheduled for office visit and Rituxan infusions. Call the clinic with any questions/concerns.   If you develop nausea and vomiting, or diarrhea that is not controlled by your medication, call the clinic.  The clinic phone number is [phone]. Office hours are Monday-Friday 8:30am-5:00pm.  BELOW ARE SYMPTOMS THAT SHOULD BE REPORTED IMMEDIATELY:  *FEVER GREATER THAN 101.0 F  *CHILLS WITH OR WITHOUT FEVER  NAUSEA AND VOMITING THAT IS NOT CONTROLLED WITH YOUR NAUSEA MEDICATION  *UNUSUAL SHORTNESS OF BREATH  *UNUSUAL BRUISING OR BLEEDING  TENDERNESS IN MOUTH AND THROAT WITH OR WITHOUT PRESENCE OF ULCERS  *URINARY PROBLEMS  *BOWEL PROBLEMS  UNUSUAL RASH Items with * indicate a potential emergency and should be followed up as soon as possible. If you have an emergency after office hours please contact your primary care physician or go to the nearest emergency department.  Please call the clinic during office hours if you have any questions or concerns.   You may also contact the Patient Navigator at [phone] should you have any questions or need assistance in obtaining follow up care. _____________________________________________________________________ Have you asked about our STAR program?    STAR stands for Survivorship Training and Rehabilitation, and this is a nationally recognized cancer care program that focuses on survivorship and rehabilitation.  Cancer and cancer treatments may cause problems, such as, pain, making you feel tired and keeping you from doing the things that you need or want to do. Cancer rehabilitation can help. Our goal is to reduce these troubling effects and help you have the best quality of life possible.  You  may receive a survey from a nurse that asks questions about your current state of health.  Based on the survey results, all eligible patients will be referred to the [facility] program for an evaluation so we can better serve you! A frequently asked questions sheet is available upon request.

## 2015-03-14 NOTE — Progress Notes (Signed)
[name] [name] presents today for injection per the provider's orders.  Aranesp administration without incident; see MAR for injection details.  Patient tolerated procedure well and without incident.  No questions or complaints noted at this time.

## 2015-03-14 NOTE — Patient Instructions (Signed)
..  [place] at [facility] Discharge Instructions  RECOMMENDATIONS MADE BY THE CONSULTANT AND ANY TEST RESULTS WILL BE SENT TO YOUR REFERRING PHYSICIAN.  Seen and discussion with Robynn Pane  PA-C. Call the cancer center with any questions and/or concerns that you have.   Take Zofran for any nausea and/or vomiting that you have. Aranesp today and every 2 weeks. Lab work every 2 weeks. CBC with diff every 2 weeks. Ferrtine every 4-6 weeks. Return in 2 months for follow up and treatment.       Thank you for choosing [place] at [facility] to provide your oncology and hematology care.  To afford each patient quality time with our provider, please arrive at least 15 minutes before your scheduled appointment time.    You need to re-schedule your appointment should you arrive 10 or more minutes late.  We strive to give you quality time with our providers, and arriving late affects you and other patients whose appointments are after yours.  Also, if you no show three or more times for appointments you may be dismissed from the clinic at the providers discretion.     Again, thank you for choosing [facility].  Our hope is that these requests will decrease the amount of time that you wait before being seen by our physicians.       _____________________________________________________________  Should you have questions after your visit to [facility], please contact our office at [phone] between the hours of 8:30 a.m. and 4:30 p.m.  Voicemails left after 4:30 p.m. will not be returned until the following business day.  For prescription refill requests, have your pharmacy contact our office.

## 2015-03-14 NOTE — Progress Notes (Signed)
Lab draw

## 2015-03-14 NOTE — Progress Notes (Signed)
See chemotherapy encounter. 

## 2015-03-14 NOTE — Assessment & Plan Note (Addendum)
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  Oncology history updated.  Labs in 2 months: CBC diff, CMET, LDH  Pre-chemo labs today as planned.  Recommend continued glucose control.  Right lower arm lesion noted anteriorly.  I suspect insect bite (ie mosquito).  It does not appear infected or necrotic.  Will follow.  Patient informed to call us if lesion progresses in size or become painful, etc.  Recommend PO antiemetic if N/V occurs.  Etiology is unlikely from Rituxan maintenance as it is not emetogenic in nature.  Counts meet treatment parameters today.  She will move on with her treatment as planned.  Return in 2 months for follow-up and next treatment of Rituxan.

## 2015-03-28 ENCOUNTER — Inpatient Hospital Stay (HOSPITAL_COMMUNITY): Payer: Medicare Other

## 2015-03-28 ENCOUNTER — Encounter (HOSPITAL_COMMUNITY): Payer: Medicare Other | Attending: Hematology & Oncology

## 2015-03-28 ENCOUNTER — Encounter (HOSPITAL_BASED_OUTPATIENT_CLINIC_OR_DEPARTMENT_OTHER): Payer: Medicare Other

## 2015-03-28 ENCOUNTER — Encounter (HOSPITAL_COMMUNITY): Payer: Self-pay

## 2015-03-28 VITALS — BP 156/54 | HR 71 | Temp 98.2°F | Resp 18

## 2015-03-28 DIAGNOSIS — D631 Anemia in chronic kidney disease: Secondary | ICD-10-CM | POA: Diagnosis not present

## 2015-03-28 DIAGNOSIS — N184 Chronic kidney disease, stage 4 (severe): Secondary | ICD-10-CM

## 2015-03-28 LAB — CBC
HEMATOCRIT: 32.3 % — AB (ref 36.0–46.0)
HEMOGLOBIN: 10.3 g/dL — AB (ref 12.0–15.0)
MCH: 31.4 pg (ref 26.0–34.0)
MCHC: 31.9 g/dL (ref 30.0–36.0)
MCV: 98.5 fL (ref 78.0–100.0)
Platelets: 234 10*3/uL (ref 150–400)
RBC: 3.28 MIL/uL — ABNORMAL LOW (ref 3.87–5.11)
RDW: 16.4 % — AB (ref 11.5–15.5)
WBC: 9.6 10*3/uL (ref 4.0–10.5)

## 2015-03-28 MED ORDER — DARBEPOETIN ALFA 150 MCG/0.3ML IJ SOSY
PREFILLED_SYRINGE | INTRAMUSCULAR | Status: AC
Start: 2015-03-28 — End: 2015-03-28
  Filled 2015-03-28: qty 0.3

## 2015-03-28 MED ORDER — DARBEPOETIN ALFA 150 MCG/0.3ML IJ SOSY
125.0000 ug | PREFILLED_SYRINGE | Freq: Once | INTRAMUSCULAR | Status: AC
Start: 2015-03-28 — End: 2015-03-28
  Administered 2015-03-28: 125 ug via SUBCUTANEOUS

## 2015-03-28 NOTE — Progress Notes (Signed)
[name] [name]'s reason for visit today is for an injection and labs as scheduled per MD orders.  Labs were drawn prior to administration of ordered medication.   [name] also received aranesp 125 mcg  per MD orders; see [facility] for administration details.  [name] tolerated all procedures well and without incident; questions were answered and patient was discharged.

## 2015-03-28 NOTE — Patient Instructions (Addendum)
[place] at [facility] Discharge Instructions  RECOMMENDATIONS MADE BY THE CONSULTANT AND ANY TEST RESULTS WILL BE SENT TO YOUR REFERRING PHYSICIAN.  Aranesp 125 mcg today Hemoglobin was 10.3  Return as scheduled Please call the clinic if you have any questions or concerns  Thank you for choosing [place] at [facility] to provide your oncology and hematology care.  To afford each patient quality time with our provider, please arrive at least 15 minutes before your scheduled appointment time.    You need to re-schedule your appointment should you arrive 10 or more minutes late.  We strive to give you quality time with our providers, and arriving late affects you and other patients whose appointments are after yours.  Also, if you no show three or more times for appointments you may be dismissed from the clinic at the providers discretion.     Again, thank you for choosing [facility].  Our hope is that these requests will decrease the amount of time that you wait before being seen by our physicians.       _____________________________________________________________  Should you have questions after your visit to [facility], please contact our office at [phone] between the hours of 8:30 a.m. and 4:30 p.m.  Voicemails left after 4:30 p.m. will not be returned until the following business day.  For prescription refill requests, have your pharmacy contact our office.

## 2015-03-31 NOTE — Progress Notes (Signed)
LABS DRAWN

## 2015-04-11 ENCOUNTER — Encounter (HOSPITAL_COMMUNITY): Payer: Medicare Other | Attending: Oncology

## 2015-04-11 ENCOUNTER — Encounter (HOSPITAL_COMMUNITY): Payer: Self-pay

## 2015-04-11 ENCOUNTER — Encounter (HOSPITAL_BASED_OUTPATIENT_CLINIC_OR_DEPARTMENT_OTHER): Payer: Medicare Other

## 2015-04-11 VITALS — BP 159/59 | HR 66 | Temp 98.5°F | Resp 16

## 2015-04-11 DIAGNOSIS — C8290 Follicular lymphoma, unspecified, unspecified site: Secondary | ICD-10-CM | POA: Insufficient documentation

## 2015-04-11 DIAGNOSIS — N184 Chronic kidney disease, stage 4 (severe): Secondary | ICD-10-CM | POA: Diagnosis present

## 2015-04-11 DIAGNOSIS — D631 Anemia in chronic kidney disease: Secondary | ICD-10-CM

## 2015-04-11 DIAGNOSIS — R300 Dysuria: Secondary | ICD-10-CM | POA: Diagnosis present

## 2015-04-11 LAB — CBC
HEMATOCRIT: 31.9 % — AB (ref 36.0–46.0)
HEMOGLOBIN: 10.1 g/dL — AB (ref 12.0–15.0)
MCH: 31.2 pg (ref 26.0–34.0)
MCHC: 31.7 g/dL (ref 30.0–36.0)
MCV: 98.5 fL (ref 78.0–100.0)
Platelets: 220 10*3/uL (ref 150–400)
RBC: 3.24 MIL/uL — AB (ref 3.87–5.11)
RDW: 16.3 % — ABNORMAL HIGH (ref 11.5–15.5)
WBC: 8.5 10*3/uL (ref 4.0–10.5)

## 2015-04-11 LAB — FERRITIN: FERRITIN: 448 ng/mL — AB (ref 11–307)

## 2015-04-11 MED ORDER — DARBEPOETIN ALFA 150 MCG/0.3ML IJ SOSY
125.0000 ug | PREFILLED_SYRINGE | Freq: Once | INTRAMUSCULAR | Status: AC
  Administered 2015-04-11: 125 ug via SUBCUTANEOUS

## 2015-04-11 MED ORDER — DARBEPOETIN ALFA 150 MCG/0.3ML IJ SOSY
PREFILLED_SYRINGE | INTRAMUSCULAR | Status: AC
  Filled 2015-04-11: qty 0.3

## 2015-04-11 NOTE — Progress Notes (Signed)
LABS DRAWN

## 2015-04-11 NOTE — Patient Instructions (Signed)
[place] at [facility] Discharge Instructions  RECOMMENDATIONS MADE BY THE CONSULTANT AND ANY TEST RESULTS WILL BE SENT TO YOUR REFERRING PHYSICIAN.  Aranesp injection today. Return as scheduled for lab work and injections. Return as scheduled for Rituxan infusion. Return as scheduled for office visit.   Thank you for choosing [place] at [facility] to provide your oncology and hematology care.  To afford each patient quality time with our provider, please arrive at least 15 minutes before your scheduled appointment time.    You need to re-schedule your appointment should you arrive 10 or more minutes late.  We strive to give you quality time with our providers, and arriving late affects you and other patients whose appointments are after yours.  Also, if you no show three or more times for appointments you may be dismissed from the clinic at the providers discretion.     Again, thank you for choosing [facility].  Our hope is that these requests will decrease the amount of time that you wait before being seen by our physicians.       _____________________________________________________________  Should you have questions after your visit to [facility], please contact our office at [phone] between the hours of 8:30 a.m. and 4:30 p.m.  Voicemails left after 4:30 p.m. will not be returned until the following business day.  For prescription refill requests, have your pharmacy contact our office.

## 2015-04-11 NOTE — Progress Notes (Signed)
1125:  [name] [name] presents today for injection per the provider's orders.  Aranesp administration without incident; see MAR for injection details.  Patient tolerated procedure well and without incident.  No questions or complaints noted at this time.

## 2015-04-14 MED ORDER — INFLUENZA VAC SPLIT QUAD 0.5 ML IM SUSY
PREFILLED_SYRINGE | INTRAMUSCULAR | Status: AC
Start: 2015-04-14 — End: 2015-04-14
  Filled 2015-04-14: qty 0.5

## 2015-04-17 ENCOUNTER — Other Ambulatory Visit (HOSPITAL_COMMUNITY): Payer: Self-pay

## 2015-04-25 ENCOUNTER — Encounter (HOSPITAL_COMMUNITY): Payer: Medicare Other | Attending: Oncology

## 2015-04-25 ENCOUNTER — Encounter (HOSPITAL_COMMUNITY): Payer: Self-pay

## 2015-04-25 ENCOUNTER — Encounter (HOSPITAL_BASED_OUTPATIENT_CLINIC_OR_DEPARTMENT_OTHER): Payer: Medicare Other

## 2015-04-25 VITALS — BP 159/58 | HR 69 | Temp 98.4°F | Resp 20

## 2015-04-25 DIAGNOSIS — D631 Anemia in chronic kidney disease: Secondary | ICD-10-CM

## 2015-04-25 DIAGNOSIS — N184 Chronic kidney disease, stage 4 (severe): Secondary | ICD-10-CM | POA: Insufficient documentation

## 2015-04-25 DIAGNOSIS — C8290 Follicular lymphoma, unspecified, unspecified site: Secondary | ICD-10-CM | POA: Diagnosis present

## 2015-04-25 DIAGNOSIS — R300 Dysuria: Secondary | ICD-10-CM | POA: Diagnosis not present

## 2015-04-25 LAB — CBC
HCT: 32.2 % — ABNORMAL LOW (ref 36.0–46.0)
Hemoglobin: 10.2 g/dL — ABNORMAL LOW (ref 12.0–15.0)
MCH: 31.2 pg (ref 26.0–34.0)
MCHC: 31.7 g/dL (ref 30.0–36.0)
MCV: 98.5 fL (ref 78.0–100.0)
PLATELETS: 233 10*3/uL (ref 150–400)
RBC: 3.27 MIL/uL — AB (ref 3.87–5.11)
RDW: 16.7 % — ABNORMAL HIGH (ref 11.5–15.5)
WBC: 9.1 10*3/uL (ref 4.0–10.5)

## 2015-04-25 MED ORDER — DARBEPOETIN ALFA 150 MCG/0.3ML IJ SOSY
125.0000 ug | PREFILLED_SYRINGE | Freq: Once | INTRAMUSCULAR | Status: AC
  Administered 2015-04-25: 125 ug via SUBCUTANEOUS

## 2015-04-25 MED ORDER — DARBEPOETIN ALFA 150 MCG/0.3ML IJ SOSY
PREFILLED_SYRINGE | INTRAMUSCULAR | Status: AC
  Filled 2015-04-25: qty 0.3

## 2015-04-25 NOTE — Patient Instructions (Signed)
[place] at [facility] Discharge Instructions  RECOMMENDATIONS MADE BY THE CONSULTANT AND ANY TEST RESULTS WILL BE SENT TO YOUR REFERRING PHYSICIAN.  Hemoglobin 10.2  Aranesp 125 mcg Return as scheduled Please call the clinic if you have any questions or concerns  Thank you for choosing [place] at [facility] to provide your oncology and hematology care.  To afford each patient quality time with our provider, please arrive at least 15 minutes before your scheduled appointment time.    You need to re-schedule your appointment should you arrive 10 or more minutes late.  We strive to give you quality time with our providers, and arriving late affects you and other patients whose appointments are after yours.  Also, if you no show three or more times for appointments you may be dismissed from the clinic at the providers discretion.     Again, thank you for choosing [facility].  Our hope is that these requests will decrease the amount of time that you wait before being seen by our physicians.       _____________________________________________________________  Should you have questions after your visit to [facility], please contact our office at [phone] between the hours of 8:30 a.m. and 4:30 p.m.  Voicemails left after 4:30 p.m. will not be returned until the following business day.  For prescription refill requests, have your pharmacy contact our office.

## 2015-04-25 NOTE — Progress Notes (Signed)
[name] [name]'s reason for visit today is for an injection and labs as scheduled per MD orders.  Labs were drawn prior to administration of ordered medication.  [name] also received aranesp 125 mcg per MD orders; see [facility] for administration details.  [name] tolerated all procedures well and without incident; questions were answered and patient was discharged.

## 2015-04-25 NOTE — Progress Notes (Signed)
LABS DRAWN

## 2015-05-09 ENCOUNTER — Encounter (HOSPITAL_COMMUNITY): Payer: Medicare Other

## 2015-05-09 ENCOUNTER — Encounter (HOSPITAL_BASED_OUTPATIENT_CLINIC_OR_DEPARTMENT_OTHER): Payer: Medicare Other

## 2015-05-09 ENCOUNTER — Encounter (HOSPITAL_BASED_OUTPATIENT_CLINIC_OR_DEPARTMENT_OTHER): Payer: Medicare Other | Admitting: Hematology & Oncology

## 2015-05-09 ENCOUNTER — Ambulatory Visit (HOSPITAL_COMMUNITY): Payer: Medicare Other | Admitting: Hematology & Oncology

## 2015-05-09 ENCOUNTER — Encounter (HOSPITAL_COMMUNITY): Payer: Self-pay | Admitting: Hematology & Oncology

## 2015-05-09 VITALS — BP 160/61 | HR 74 | Temp 98.0°F | Resp 22 | Wt 228.9 lb

## 2015-05-09 DIAGNOSIS — N184 Chronic kidney disease, stage 4 (severe): Secondary | ICD-10-CM

## 2015-05-09 DIAGNOSIS — D631 Anemia in chronic kidney disease: Secondary | ICD-10-CM | POA: Diagnosis not present

## 2015-05-09 DIAGNOSIS — Z23 Encounter for immunization: Secondary | ICD-10-CM | POA: Diagnosis not present

## 2015-05-09 DIAGNOSIS — D649 Anemia, unspecified: Secondary | ICD-10-CM | POA: Diagnosis not present

## 2015-05-09 DIAGNOSIS — R300 Dysuria: Secondary | ICD-10-CM

## 2015-05-09 DIAGNOSIS — N189 Chronic kidney disease, unspecified: Secondary | ICD-10-CM | POA: Diagnosis not present

## 2015-05-09 DIAGNOSIS — L989 Disorder of the skin and subcutaneous tissue, unspecified: Secondary | ICD-10-CM | POA: Diagnosis not present

## 2015-05-09 DIAGNOSIS — C8290 Follicular lymphoma, unspecified, unspecified site: Secondary | ICD-10-CM | POA: Diagnosis not present

## 2015-05-09 LAB — CBC
HEMATOCRIT: 32.2 % — AB (ref 36.0–46.0)
Hemoglobin: 10.1 g/dL — ABNORMAL LOW (ref 12.0–15.0)
MCH: 31.2 pg (ref 26.0–34.0)
MCHC: 31.4 g/dL (ref 30.0–36.0)
MCV: 99.4 fL (ref 78.0–100.0)
Platelets: 261 10*3/uL (ref 150–400)
RBC: 3.24 MIL/uL — ABNORMAL LOW (ref 3.87–5.11)
RDW: 16.5 % — AB (ref 11.5–15.5)
WBC: 8.7 10*3/uL (ref 4.0–10.5)

## 2015-05-09 LAB — FERRITIN: FERRITIN: 488 ng/mL — AB (ref 11–307)

## 2015-05-09 MED ORDER — DARBEPOETIN ALFA 150 MCG/0.3ML IJ SOSY
125.0000 ug | PREFILLED_SYRINGE | Freq: Once | INTRAMUSCULAR | Status: AC
  Administered 2015-05-09: 125 ug via SUBCUTANEOUS

## 2015-05-09 MED ORDER — INFLUENZA VAC SPLIT QUAD 0.5 ML IM SUSY
0.5000 mL | PREFILLED_SYRINGE | Freq: Once | INTRAMUSCULAR | Status: AC
  Administered 2015-05-09: 0.5 mL via INTRAMUSCULAR

## 2015-05-09 NOTE — Progress Notes (Signed)
Labs drawn

## 2015-05-09 NOTE — Patient Instructions (Signed)
[place] at [facility] Discharge Instructions  RECOMMENDATIONS MADE BY THE CONSULTANT AND ANY TEST RESULTS WILL BE SENT TO YOUR REFERRING PHYSICIAN.  Exam done and seen by Dr.Penland Aranesp administered today per orders Rituxan in 64months and follow up. Continue Aranesp every 2weeks  Thank you for choosing [place] at [facility] to provide your oncology and hematology care.  To afford each patient quality time with our provider, please arrive at least 15 minutes before your scheduled appointment time.    You need to re-schedule your appointment should you arrive 10 or more minutes late.  We strive to give you quality time with our providers, and arriving late affects you and other patients whose appointments are after yours.  Also, if you no show three or more times for appointments you may be dismissed from the clinic at the providers discretion.     Again, thank you for choosing [facility].  Our hope is that these requests will decrease the amount of time that you wait before being seen by our physicians.       _____________________________________________________________  Should you have questions after your visit to [facility], please contact our office at [phone] between the hours of 8:30 a.m. and 4:30 p.m.  Voicemails left after 4:30 p.m. will not be returned until the following business day.  For prescription refill requests, have your pharmacy contact our office.

## 2015-05-09 NOTE — Progress Notes (Signed)
[name], MD [address] [place] [address]  Follicular Lymphoma, treated with 6 cycles of treanda/rituxan. 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 female returns for follow-up of her follicular lymphoma.   She  reports burning with urination. She is sleeping and eating well. She is getting around town and goes to church. She reports abdominal wall bruising after she injects her insulin. She has a lesion on the R cheek and states she occasionally notices blood on her face when she wakes up in the morning. She also reports occasional bleeding of this lesion when she yawns. Also reports non-radiating lower back pain.  Weight is stable. She is overdue for a screening mammogram.   She continues on maintenance rituxan. No other major complaints today.  MEDICAL HISTORY: Past Medical History  Diagnosis Date  . Hypertension   . Diabetes mellitus, type 2 ([place])   . Hyperlipidemia   . Iron deficiency anemia 11/21/2012    At time of presentation.  Feraheme 1020 mg on 11/21/12.  . Follicular lymphoma ([place]) 11/20/2012    Right ureteral obstruction by lymphadenopathy-> right hydronephrosis  . Port catheter in place 06/06/2013  . Anemia of chronic renal failure, stage 4 (severe) ([place]) 2/53/6644    has Follicular lymphoma ([place]); Iron deficiency anemia; Chronic kidney disease (CKD), stage IV (severe) ([place]); Diabetes mellitus type 2 in obese ([place]); Hypertension; UTI (urinary tract infection); Leukocytosis, unspecified; Hypoglycemia; Port catheter in place; CKD (chronic kidney disease); Anemia of chronic renal failure, stage 4 (severe) ([place]); and Dysuria on her problem list.      has No Known Allergies.  Current Outpatient Prescriptions on File Prior to Visit  Medication Sig Dispense Refill  . ACCU-CHEK AVIVA PLUS test strip     . acetaminophen (TYLENOL) 325 MG tablet Take 650 mg by mouth every 6 (six) hours as needed for pain.    Marland Kitchen atorvastatin (LIPITOR) 80 MG tablet Take 80 mg by mouth every morning.    . clotrimazole-betamethasone (LOTRISONE) cream Apply to affected area 2 times daily    . febuxostat (ULORIC) 40 MG tablet Take 40 mg by mouth daily.    . furosemide (LASIX) 20 MG tablet Take 20 mg by mouth daily. One tablet 3 times a week    . glipiZIDE (GLUCOTROL XL) 5 MG 24 hr tablet Take 5 mg by mouth.    Marland Kitchen glipiZIDE (GLUCOTROL XL) 5 MG 24 hr tablet Take 10 mg by mouth daily.    . insulin detemir (LEVEMIR) 100 UNIT/ML injection Inject 50 Units into the skin 2 (two) times daily. Taking 45 units in am and 35 units at night    . LEVEMIR FLEXTOUCH 100 UNIT/ML Pen Inject 50 Units into the skin daily. Inject 50 Units into the skin 2 (two) times daily. Taking 45 units in am and 35 units at night  4  . lidocaine-prilocaine (EMLA) cream Apply 1 application topically as needed. 1 hour prior to Chemo 30 g prn  . losartan (COZAAR) 100 MG tablet Take 100 mg by mouth daily.    . metoCLOPramide (REGLAN) 5 MG tablet Take 2 tablets (10 mg total) by mouth 3 (three) times daily before meals. (Patient not taking: Reported on 05/19/2015) 60 tablet 2  . Multiple Vitamin (MULTIVITAMIN  WITH MINERALS) TABS Take 1 tablet by mouth daily.     No current facility-administered medications on file prior to visit.    SURGICAL HISTORY: Past Surgical History  Procedure Laterality Date  . Cataract extraction    . Cystoscopy w/ ureteral stent placement Right 11/03/2012    Procedure: CYSTOSCOPY WITH RETROGRADE PYELOGRAM/URETERAL STENT PLACEMENT;  Surgeon: Malka So, MD;  Location: [facility];  Service: Urology;  Laterality: Right;  . Portacath placement Left 11/03/2012    Procedure: INSERTION  PORT-A-CATH;  Surgeon: Scherry Ran, MD;  Location: [facility];  Service: General;  Laterality: Left;  Insertion Port-A-Cath Left Subclavian  . Appendectomy    . Cystoscopy w/ ureteral stent placement Right 03/23/2013    Procedure: CYSTOSCOPY WITH STENT REPLACEMENT;  Surgeon: Malka So, MD;  Location: [facility];  Service: Urology;  Laterality: Right;    SOCIAL HISTORY: Social History   Social History  . Marital Status: Married    Spouse Name: N/A  . Number of Children: N/A  . Years of Education: N/A   Occupational History  . Not on file.   Social History Main Topics  . Smoking status: Never Smoker   . Smokeless tobacco: Never Used  . Alcohol Use: No  . Drug Use: No  . Sexual Activity: No   Other Topics Concern  . Not on file   Social History Narrative    FAMILY HISTORY: Family History  Problem Relation Age of Onset  . Cancer Father   . Cancer Brother     Review of Systems  Constitutional:Negative for fever, chills and weight loss.  HENT: Negative for congestion, hearing loss, nosebleeds, sore throat and tinnitus.   Eyes: Negative for blurred vision, double vision, pain and discharge.  Respiratory: Negative for cough, hemoptysis, sputum production, shortness of breath and wheezing.   Cardiovascular: Negative for chest pain, palpitations, claudication, leg swelling and PND.  Gastrointestinal: Negative for heartburn, vomiting, abdominal pain, constipation, blood in stool and melena. Rare loose stool and nausea Genitourinary: Negative for hematuria. Positive for dysuria Musculoskeletal: Negative for myalgias and falls. Positive for back pain Skin: Negative for itching and rash. Positive for lesion on r cheek Neurological: Negative for dizziness, tingling, tremors, sensory change, speech change, focal weakness, seizures, loss of consciousness, weakness and headaches.  Endo/Heme/Allergies: Does not bruise/bleed easily.  Psychiatric/Behavioral: Negative for depression,  suicidal ideas, memory loss and substance abuse. The patient is not nervous/anxious and does not have insomnia.    14 point review of systems was performed and is negative except as detailed under history of present illness and above   PHYSICAL EXAMINATION  ECOG PERFORMANCE STATUS: 1 - Symptomatic but completely ambulatory  Filed Vitals:   05/09/15 1203  BP: 160/61  Pulse: 74  Temp: 98 F (36.7 C)  Resp: 22    Physical Exam  Constitutional: She is oriented to person, place, and time and well-developed, well-nourished, and in no distress.  Obese  HENT:  Head: Normocephalic and atraumatic.  Nose: Nose normal.  Mouth/Throat: Oropharynx is clear and moist. No oropharyngeal exudate.  Eyes: Conjunctivae and EOM are normal. Pupils are equal, round, and reactive to light. Right eye exhibits no discharge. Left eye exhibits no discharge. No scleral icterus.  Neck: Normal range of motion. Neck supple. No tracheal deviation present. No thyromegaly present.  Cardiovascular: Normal rate and regular rhythm.  Exam reveals no gallop and no friction rub.   Murmur heard. Pulmonary/Chest: Effort normal and breath sounds normal. She has no wheezes.  She has no rales.  Abdominal: Soft. Bowel sounds are normal. She exhibits no distension and no mass. There is no tenderness. There is no rebound and no guarding.  Musculoskeletal: Normal range of motion. She exhibits edema.  Lymphadenopathy:    She has no cervical adenopathy.  Neurological: She is alert and oriented to person, place, and time. She has normal reflexes. No cranial nerve deficit. Gait normal. Coordination normal.  Skin: Skin is warm and dry. No rash noted.  Psychiatric: Mood, memory, affect and judgment normal.  Nursing note and vitals reviewed.   LABORATORY DATA: I have reviewed the data as listed  CBC    Component Value Date/Time   WBC 7.2 06/02/2015 1222   RBC 3.09* 06/02/2015 1222   RBC 3.17* 07/26/2014 0956   HGB 9.6*  06/02/2015 1222   HCT 30.7* 06/02/2015 1222   PLT 220 06/02/2015 1222   MCV 99.4 06/02/2015 1222   MCH 31.1 06/02/2015 1222   MCHC 31.3 06/02/2015 1222   RDW 16.4* 06/02/2015 1222   LYMPHSABS 0.9 05/19/2015 0930   MONOABS 0.5 05/19/2015 0930   EOSABS 0.2 05/19/2015 0930   BASOSABS 0.0 05/19/2015 0930   CMP     Component Value Date/Time   NA 139 05/19/2015 0930   K 4.6 05/19/2015 0930   CL 105 05/19/2015 0930   CO2 23 05/19/2015 0930   GLUCOSE 262* 05/19/2015 0930   BUN 33* 05/19/2015 0930   CREATININE 2.25* 05/19/2015 0930   CALCIUM 9.1 05/19/2015 0930   PROT 6.1* 05/19/2015 0930   ALBUMIN 3.4* 05/19/2015 0930   AST 21 05/19/2015 0930   ALT 17 05/19/2015 0930   ALKPHOS 99 05/19/2015 0930   BILITOT 0.8 05/19/2015 0930   GFRNONAA 20* 05/19/2015 0930   GFRAA 24* 05/19/2015 0930     ASSESSMENT and THERAPY PLAN:   Follicular lymphoma Dysuria CKD Anemia Skin lesion  74 year old female with follicular lymphoma, she was treated with Treanda and Rituxan. She is currently on Rituxan maintenance. She is doing well. She will finish maintenance at the end of this year.  Will get urine sample and schedule a mammogram. I will refer her to dermatology for her face lesion.  Anemia is stable. She will continue on renal dosing of aranesp. She is due for a repeat ferritin. She will be given IV iron if needed. Goal is to maintain ferritin greater than 100.   She will return again in 2 months, sooner if needed.    All questions were answered. The patient knows to call the clinic with any problems, questions or concerns. We can certainly see the patient much sooner if necessary.  This document serves as a record of services personally performed by Ancil Linsey, MD. It was created on her behalf by Toni Amend, a trained medical scribe. The creation of this record is based on the scribe's personal observations and the provider's statements to them. This document has been checked  and approved by the attending provider.  I have reviewed the above documentation for accuracy and completeness, and I agree with the above.  Kelby Fam. Deng Kemler MD

## 2015-05-09 NOTE — Progress Notes (Signed)
[name] [name] presents today for injection per MD orders. Aranesp 139mcg administered SQ in right Abdomen. Administration without incident. Patient tolerated well.  Flu shot given today per patient request. Pt tolerated well.

## 2015-05-15 ENCOUNTER — Other Ambulatory Visit (HOSPITAL_COMMUNITY): Payer: Self-pay

## 2015-05-19 ENCOUNTER — Other Ambulatory Visit (HOSPITAL_COMMUNITY): Payer: Self-pay | Admitting: Oncology

## 2015-05-19 ENCOUNTER — Encounter (HOSPITAL_COMMUNITY): Payer: Medicare Other | Attending: Oncology

## 2015-05-19 ENCOUNTER — Encounter (HOSPITAL_COMMUNITY): Payer: Self-pay

## 2015-05-19 VITALS — BP 164/61 | HR 66 | Temp 97.7°F | Resp 18 | Wt 228.0 lb

## 2015-05-19 DIAGNOSIS — D631 Anemia in chronic kidney disease: Secondary | ICD-10-CM | POA: Diagnosis not present

## 2015-05-19 DIAGNOSIS — N184 Chronic kidney disease, stage 4 (severe): Secondary | ICD-10-CM | POA: Insufficient documentation

## 2015-05-19 DIAGNOSIS — R300 Dysuria: Secondary | ICD-10-CM | POA: Diagnosis not present

## 2015-05-19 DIAGNOSIS — Z5112 Encounter for antineoplastic immunotherapy: Secondary | ICD-10-CM

## 2015-05-19 DIAGNOSIS — C8290 Follicular lymphoma, unspecified, unspecified site: Secondary | ICD-10-CM | POA: Diagnosis not present

## 2015-05-19 LAB — CBC WITH DIFFERENTIAL/PLATELET
BASOS ABS: 0 10*3/uL (ref 0.0–0.1)
Basophils Relative: 0 %
EOS PCT: 2 %
Eosinophils Absolute: 0.2 10*3/uL (ref 0.0–0.7)
HEMATOCRIT: 31.2 % — AB (ref 36.0–46.0)
Hemoglobin: 9.7 g/dL — ABNORMAL LOW (ref 12.0–15.0)
LYMPHS ABS: 0.9 10*3/uL (ref 0.7–4.0)
LYMPHS PCT: 10 %
MCH: 31.1 pg (ref 26.0–34.0)
MCHC: 31.1 g/dL (ref 30.0–36.0)
MCV: 100 fL (ref 78.0–100.0)
MONO ABS: 0.5 10*3/uL (ref 0.1–1.0)
Monocytes Relative: 6 %
NEUTROS ABS: 6.8 10*3/uL (ref 1.7–7.7)
Neutrophils Relative %: 82 %
PLATELETS: 228 10*3/uL (ref 150–400)
RBC: 3.12 MIL/uL — ABNORMAL LOW (ref 3.87–5.11)
RDW: 16.9 % — AB (ref 11.5–15.5)
WBC: 8.4 10*3/uL (ref 4.0–10.5)

## 2015-05-19 LAB — COMPREHENSIVE METABOLIC PANEL
ALT: 17 U/L (ref 14–54)
AST: 21 U/L (ref 15–41)
Albumin: 3.4 g/dL — ABNORMAL LOW (ref 3.5–5.0)
Alkaline Phosphatase: 99 U/L (ref 38–126)
Anion gap: 11 (ref 5–15)
BILIRUBIN TOTAL: 0.8 mg/dL (ref 0.3–1.2)
BUN: 33 mg/dL — AB (ref 6–20)
CHLORIDE: 105 mmol/L (ref 101–111)
CO2: 23 mmol/L (ref 22–32)
CREATININE: 2.25 mg/dL — AB (ref 0.44–1.00)
Calcium: 9.1 mg/dL (ref 8.9–10.3)
GFR, EST AFRICAN AMERICAN: 24 mL/min — AB (ref >60)
GFR, EST NON AFRICAN AMERICAN: 20 mL/min — AB (ref >60)
Glucose, Bld: 262 mg/dL — ABNORMAL HIGH (ref 65–99)
POTASSIUM: 4.6 mmol/L (ref 3.5–5.1)
Sodium: 139 mmol/L (ref 135–145)
TOTAL PROTEIN: 6.1 g/dL — AB (ref 6.5–8.1)

## 2015-05-19 MED ORDER — HEPARIN SOD (PORK) LOCK FLUSH 100 UNIT/ML IV SOLN
500.0000 [IU] | Freq: Once | INTRAVENOUS | Status: AC | PRN
  Administered 2015-05-19: 500 [IU]
  Filled 2015-05-19: qty 5

## 2015-05-19 MED ORDER — ACETAMINOPHEN 325 MG PO TABS
650.0000 mg | ORAL_TABLET | Freq: Once | ORAL | Status: AC
  Administered 2015-05-19: 650 mg via ORAL
  Filled 2015-05-19: qty 2

## 2015-05-19 MED ORDER — DIPHENHYDRAMINE HCL 25 MG PO CAPS
50.0000 mg | ORAL_CAPSULE | Freq: Once | ORAL | Status: AC
  Administered 2015-05-19: 50 mg via ORAL
  Filled 2015-05-19: qty 2

## 2015-05-19 MED ORDER — DARBEPOETIN ALFA 150 MCG/0.3ML IJ SOSY
125.0000 ug | PREFILLED_SYRINGE | Freq: Once | INTRAMUSCULAR | Status: AC
  Administered 2015-05-19: 125 ug via SUBCUTANEOUS

## 2015-05-19 MED ORDER — SODIUM CHLORIDE 0.9 % IV SOLN
Freq: Once | INTRAVENOUS | Status: AC
  Administered 2015-05-19: 09:00:00 via INTRAVENOUS

## 2015-05-19 MED ORDER — DARBEPOETIN ALFA 150 MCG/0.3ML IJ SOSY
PREFILLED_SYRINGE | INTRAMUSCULAR | Status: AC
  Filled 2015-05-19: qty 0.3

## 2015-05-19 MED ORDER — SODIUM CHLORIDE 0.9 % IJ SOLN: 10.0000 mL | INTRAMUSCULAR | Status: DC | PRN

## 2015-05-19 MED ORDER — RITUXIMAB CHEMO INJECTION 500 MG/50ML
375.0000 mg/m2 | Freq: Once | INTRAVENOUS | Status: AC
  Administered 2015-05-19: 800 mg via INTRAVENOUS
  Filled 2015-05-19: qty 70

## 2015-05-19 NOTE — Progress Notes (Signed)
Noted glucose of 262. Patient took levimir 50 units at 0730 Tolerated treatment well.

## 2015-05-19 NOTE — Patient Instructions (Signed)
..  [place] at [facility] Discharge Instructions  RECOMMENDATIONS MADE BY THE CONSULTANT AND ANY TEST RESULTS WILL BE SENT TO YOUR REFERRING PHYSICIAN. Aranesp and rituxan today Return in 2 weeks for aranesp and labs Return in 2 months for Rituxan  Thank you for choosing [place] at [facility] to provide your oncology and hematology care.  To afford each patient quality time with our provider, please arrive at least 15 minutes before your scheduled appointment time.    You need to re-schedule your appointment should you arrive 10 or more minutes late.  We strive to give you quality time with our providers, and arriving late affects you and other patients whose appointments are after yours.  Also, if you no show three or more times for appointments you may be dismissed from the clinic at the providers discretion.     Again, thank you for choosing [facility].  Our hope is that these requests will decrease the amount of time that you wait before being seen by our physicians.       _____________________________________________________________  Should you have questions after your visit to [facility], please contact our office at [phone] between the hours of 8:30 a.m. and 4:30 p.m.  Voicemails left after 4:30 p.m. will not be returned until the following business day.  For prescription refill requests, have your pharmacy contact our office.

## 2015-05-23 ENCOUNTER — Other Ambulatory Visit (HOSPITAL_COMMUNITY): Payer: Medicare Other

## 2015-05-23 ENCOUNTER — Encounter (HOSPITAL_COMMUNITY): Payer: Medicare Other

## 2015-05-29 ENCOUNTER — Other Ambulatory Visit (HOSPITAL_COMMUNITY): Payer: Self-pay

## 2015-06-02 ENCOUNTER — Encounter (HOSPITAL_BASED_OUTPATIENT_CLINIC_OR_DEPARTMENT_OTHER): Payer: Medicare Other

## 2015-06-02 VITALS — BP 162/65 | HR 57 | Temp 98.0°F | Resp 18

## 2015-06-02 DIAGNOSIS — D631 Anemia in chronic kidney disease: Secondary | ICD-10-CM

## 2015-06-02 DIAGNOSIS — R300 Dysuria: Secondary | ICD-10-CM | POA: Diagnosis not present

## 2015-06-02 DIAGNOSIS — N184 Chronic kidney disease, stage 4 (severe): Secondary | ICD-10-CM | POA: Diagnosis not present

## 2015-06-02 LAB — CBC
HCT: 30.7 % — ABNORMAL LOW (ref 36.0–46.0)
Hemoglobin: 9.6 g/dL — ABNORMAL LOW (ref 12.0–15.0)
MCH: 31.1 pg (ref 26.0–34.0)
MCHC: 31.3 g/dL (ref 30.0–36.0)
MCV: 99.4 fL (ref 78.0–100.0)
Platelets: 220 K/uL (ref 150–400)
RBC: 3.09 MIL/uL — ABNORMAL LOW (ref 3.87–5.11)
RDW: 16.4 % — ABNORMAL HIGH (ref 11.5–15.5)
WBC: 7.2 K/uL (ref 4.0–10.5)

## 2015-06-02 LAB — FERRITIN: Ferritin: 454 ng/mL — ABNORMAL HIGH (ref 11–307)

## 2015-06-02 MED ORDER — DARBEPOETIN ALFA 150 MCG/0.3ML IJ SOSY
125.0000 ug | PREFILLED_SYRINGE | Freq: Once | INTRAMUSCULAR | Status: AC
  Administered 2015-06-02: 125 ug via SUBCUTANEOUS
  Filled 2015-06-02: qty 0.3

## 2015-06-02 NOTE — Progress Notes (Signed)
LABS DRAWN

## 2015-06-02 NOTE — Patient Instructions (Signed)
[place] at [facility] Discharge Instructions  RECOMMENDATIONS MADE BY THE CONSULTANT AND ANY TEST RESULTS WILL BE SENT TO YOUR REFERRING PHYSICIAN.  Hemoglobin 9.6.  Aranesp 125 mcg injection given as ordered. Return as scheduled.  Thank you for choosing [place] at [facility] to provide your oncology and hematology care.  To afford each patient quality time with our provider, please arrive at least 15 minutes before your scheduled appointment time.    You need to re-schedule your appointment should you arrive 10 or more minutes late.  We strive to give you quality time with our providers, and arriving late affects you and other patients whose appointments are after yours.  Also, if you no show three or more times for appointments you may be dismissed from the clinic at the providers discretion.     Again, thank you for choosing [facility].  Our hope is that these requests will decrease the amount of time that you wait before being seen by our physicians.       _____________________________________________________________  Should you have questions after your visit to [facility], please contact our office at [phone] between the hours of 8:30 a.m. and 4:30 p.m.  Voicemails left after 4:30 p.m. will not be returned until the following business day.  For prescription refill requests, have your pharmacy contact our office.

## 2015-06-02 NOTE — Progress Notes (Signed)
[name] [name] presents today for injection per MD orders. Aranesp 125mcg administered SQ in right Abdomen. Administration without incident. Patient tolerated well.  

## 2015-06-09 ENCOUNTER — Encounter (HOSPITAL_COMMUNITY): Payer: Self-pay | Admitting: Hematology & Oncology

## 2015-06-09 ENCOUNTER — Other Ambulatory Visit (HOSPITAL_COMMUNITY): Payer: Medicare Other

## 2015-06-09 ENCOUNTER — Encounter (HOSPITAL_COMMUNITY): Payer: Medicare Other

## 2015-06-11 ENCOUNTER — Other Ambulatory Visit (HOSPITAL_COMMUNITY): Payer: Self-pay | Admitting: Hematology & Oncology

## 2015-06-16 ENCOUNTER — Encounter (HOSPITAL_COMMUNITY): Payer: Medicare Other | Attending: Hematology & Oncology

## 2015-06-16 ENCOUNTER — Encounter (HOSPITAL_COMMUNITY): Payer: Medicare Other | Attending: Oncology

## 2015-06-16 VITALS — BP 183/59 | HR 78 | Temp 98.0°F | Resp 20

## 2015-06-16 DIAGNOSIS — C8290 Follicular lymphoma, unspecified, unspecified site: Secondary | ICD-10-CM | POA: Diagnosis present

## 2015-06-16 DIAGNOSIS — R300 Dysuria: Secondary | ICD-10-CM | POA: Insufficient documentation

## 2015-06-16 DIAGNOSIS — D631 Anemia in chronic kidney disease: Secondary | ICD-10-CM | POA: Diagnosis not present

## 2015-06-16 DIAGNOSIS — N184 Chronic kidney disease, stage 4 (severe): Secondary | ICD-10-CM

## 2015-06-16 LAB — CBC
HCT: 31.8 % — ABNORMAL LOW (ref 36.0–46.0)
HEMOGLOBIN: 9.8 g/dL — AB (ref 12.0–15.0)
MCH: 30.6 pg (ref 26.0–34.0)
MCHC: 30.8 g/dL (ref 30.0–36.0)
MCV: 99.4 fL (ref 78.0–100.0)
Platelets: 237 10*3/uL (ref 150–400)
RBC: 3.2 MIL/uL — AB (ref 3.87–5.11)
RDW: 16.4 % — ABNORMAL HIGH (ref 11.5–15.5)
WBC: 8.5 10*3/uL (ref 4.0–10.5)

## 2015-06-16 MED ORDER — DARBEPOETIN ALFA 150 MCG/0.3ML IJ SOSY
PREFILLED_SYRINGE | INTRAMUSCULAR | Status: AC
  Filled 2015-06-16: qty 0.3

## 2015-06-16 MED ORDER — DARBEPOETIN ALFA 150 MCG/0.3ML IJ SOSY
125.0000 ug | PREFILLED_SYRINGE | Freq: Once | INTRAMUSCULAR | Status: AC
  Administered 2015-06-16: 125 ug via SUBCUTANEOUS

## 2015-06-16 NOTE — Progress Notes (Signed)
Labs drawn

## 2015-06-16 NOTE — Patient Instructions (Signed)
[place] at [facility] Discharge Instructions  RECOMMENDATIONS MADE BY THE CONSULTANT AND ANY TEST RESULTS WILL BE SENT TO YOUR REFERRING PHYSICIAN.  Hemoglobin 9.8 today. Aranesp 125 mcg injection given as ordered. Return as scheduled.  Thank you for choosing [place] at [facility] to provide your oncology and hematology care.  To afford each patient quality time with our provider, please arrive at least 15 minutes before your scheduled appointment time.    You need to re-schedule your appointment should you arrive 10 or more minutes late.  We strive to give you quality time with our providers, and arriving late affects you and other patients whose appointments are after yours.  Also, if you no show three or more times for appointments you may be dismissed from the clinic at the providers discretion.     Again, thank you for choosing [facility].  Our hope is that these requests will decrease the amount of time that you wait before being seen by our physicians.       _____________________________________________________________  Should you have questions after your visit to [facility], please contact our office at [phone] between the hours of 8:30 a.m. and 4:30 p.m.  Voicemails left after 4:30 p.m. will not be returned until the following business day.  For prescription refill requests, have your pharmacy contact our office.

## 2015-06-16 NOTE — Progress Notes (Signed)
[name] [name] presents today for injection per MD orders. Aranesp 125 mcg administered SQ in left Abdomen. Administration without incident. Patient tolerated well.  

## 2015-06-20 ENCOUNTER — Encounter (HOSPITAL_COMMUNITY): Payer: Medicare Other

## 2015-06-20 ENCOUNTER — Other Ambulatory Visit (HOSPITAL_COMMUNITY): Payer: Medicare Other

## 2015-06-30 ENCOUNTER — Encounter (HOSPITAL_COMMUNITY): Payer: Medicare Other

## 2015-06-30 ENCOUNTER — Encounter (HOSPITAL_BASED_OUTPATIENT_CLINIC_OR_DEPARTMENT_OTHER): Payer: Medicare Other

## 2015-06-30 VITALS — BP 151/58 | HR 60 | Temp 98.3°F | Resp 18

## 2015-06-30 DIAGNOSIS — N184 Chronic kidney disease, stage 4 (severe): Secondary | ICD-10-CM | POA: Diagnosis not present

## 2015-06-30 DIAGNOSIS — D631 Anemia in chronic kidney disease: Secondary | ICD-10-CM | POA: Diagnosis not present

## 2015-06-30 DIAGNOSIS — R300 Dysuria: Secondary | ICD-10-CM | POA: Diagnosis not present

## 2015-06-30 LAB — CBC
HCT: 33.8 % — ABNORMAL LOW (ref 36.0–46.0)
HEMOGLOBIN: 10.5 g/dL — AB (ref 12.0–15.0)
MCH: 31.1 pg (ref 26.0–34.0)
MCHC: 31.1 g/dL (ref 30.0–36.0)
MCV: 100 fL (ref 78.0–100.0)
Platelets: 252 10*3/uL (ref 150–400)
RBC: 3.38 MIL/uL — AB (ref 3.87–5.11)
RDW: 16.4 % — ABNORMAL HIGH (ref 11.5–15.5)
WBC: 8.3 10*3/uL (ref 4.0–10.5)

## 2015-06-30 LAB — FERRITIN: FERRITIN: 251 ng/mL (ref 11–307)

## 2015-06-30 MED ORDER — DARBEPOETIN ALFA 150 MCG/0.3ML IJ SOSY
125.0000 ug | PREFILLED_SYRINGE | Freq: Once | INTRAMUSCULAR | Status: AC
  Administered 2015-06-30: 125 ug via SUBCUTANEOUS
  Filled 2015-06-30: qty 0.3

## 2015-06-30 NOTE — Patient Instructions (Signed)
[place] at [facility] Discharge Instructions  RECOMMENDATIONS MADE BY THE CONSULTANT AND ANY TEST RESULTS WILL BE SENT TO YOUR REFERRING PHYSICIAN.  Hemoglobin 10.5 today. Aranesp 125 mcg injection given as ordered. Return as scheduled.  Thank you for choosing [place] at [facility] to provide your oncology and hematology care.  To afford each patient quality time with our provider, please arrive at least 15 minutes before your scheduled appointment time.    You need to re-schedule your appointment should you arrive 10 or more minutes late.  We strive to give you quality time with our providers, and arriving late affects you and other patients whose appointments are after yours.  Also, if you no show three or more times for appointments you may be dismissed from the clinic at the providers discretion.     Again, thank you for choosing [facility].  Our hope is that these requests will decrease the amount of time that you wait before being seen by our physicians.       _____________________________________________________________  Should you have questions after your visit to [facility], please contact our office at [phone] between the hours of 8:30 a.m. and 4:30 p.m.  Voicemails left after 4:30 p.m. will not be returned until the following business day.  For prescription refill requests, have your pharmacy contact our office.

## 2015-06-30 NOTE — Progress Notes (Signed)
..  [name] [name]'s reason for visit today are for labs as scheduled per MD orders.  Venipuncture performed with a 23 gauge butterfly needle to L Antecubital.  [name] [name] tolerated venipuncture well and without incident; questions were answered and patient was discharged.

## 2015-06-30 NOTE — Progress Notes (Signed)
[name] [name] presents today for injection per MD orders. Aranesp 125mcg administered SQ in right Abdomen. Administration without incident. Patient tolerated well.  

## 2015-07-04 ENCOUNTER — Other Ambulatory Visit (HOSPITAL_COMMUNITY): Payer: Medicare Other

## 2015-07-04 ENCOUNTER — Encounter (HOSPITAL_COMMUNITY): Payer: Medicare Other

## 2015-07-17 ENCOUNTER — Encounter (HOSPITAL_COMMUNITY): Payer: Self-pay | Admitting: Hematology & Oncology

## 2015-07-17 ENCOUNTER — Other Ambulatory Visit (HOSPITAL_COMMUNITY): Payer: Self-pay | Admitting: Hematology & Oncology

## 2015-07-17 ENCOUNTER — Encounter (HOSPITAL_COMMUNITY): Payer: Medicare Other

## 2015-07-17 ENCOUNTER — Encounter (HOSPITAL_COMMUNITY): Payer: Medicare Other | Attending: Oncology

## 2015-07-17 ENCOUNTER — Encounter (HOSPITAL_BASED_OUTPATIENT_CLINIC_OR_DEPARTMENT_OTHER): Payer: Medicare Other | Admitting: Hematology & Oncology

## 2015-07-17 ENCOUNTER — Encounter (HOSPITAL_COMMUNITY): Payer: Medicare Other | Attending: Hematology & Oncology

## 2015-07-17 VITALS — BP 160/50 | HR 57 | Temp 97.8°F | Resp 18 | Wt 230.8 lb

## 2015-07-17 VITALS — BP 182/71 | HR 71 | Temp 97.5°F | Resp 16

## 2015-07-17 DIAGNOSIS — N184 Chronic kidney disease, stage 4 (severe): Secondary | ICD-10-CM | POA: Insufficient documentation

## 2015-07-17 DIAGNOSIS — Z1231 Encounter for screening mammogram for malignant neoplasm of breast: Secondary | ICD-10-CM

## 2015-07-17 DIAGNOSIS — L989 Disorder of the skin and subcutaneous tissue, unspecified: Secondary | ICD-10-CM | POA: Diagnosis not present

## 2015-07-17 DIAGNOSIS — D649 Anemia, unspecified: Secondary | ICD-10-CM

## 2015-07-17 DIAGNOSIS — C8290 Follicular lymphoma, unspecified, unspecified site: Secondary | ICD-10-CM | POA: Diagnosis not present

## 2015-07-17 DIAGNOSIS — N289 Disorder of kidney and ureter, unspecified: Secondary | ICD-10-CM

## 2015-07-17 DIAGNOSIS — R300 Dysuria: Secondary | ICD-10-CM

## 2015-07-17 DIAGNOSIS — C8223 Follicular lymphoma grade III, unspecified, intra-abdominal lymph nodes: Secondary | ICD-10-CM

## 2015-07-17 DIAGNOSIS — Z1239 Encounter for other screening for malignant neoplasm of breast: Secondary | ICD-10-CM

## 2015-07-17 DIAGNOSIS — R350 Frequency of micturition: Secondary | ICD-10-CM

## 2015-07-17 DIAGNOSIS — N189 Chronic kidney disease, unspecified: Secondary | ICD-10-CM | POA: Diagnosis not present

## 2015-07-17 DIAGNOSIS — Z5112 Encounter for antineoplastic immunotherapy: Secondary | ICD-10-CM

## 2015-07-17 LAB — COMPREHENSIVE METABOLIC PANEL
ALBUMIN: 3.8 g/dL (ref 3.5–5.0)
ALK PHOS: 92 U/L (ref 38–126)
ALT: 18 U/L (ref 14–54)
ANION GAP: 10 (ref 5–15)
AST: 21 U/L (ref 15–41)
BILIRUBIN TOTAL: 0.4 mg/dL (ref 0.3–1.2)
BUN: 45 mg/dL — AB (ref 6–20)
CO2: 24 mmol/L (ref 22–32)
CREATININE: 2.49 mg/dL — AB (ref 0.44–1.00)
Calcium: 9.5 mg/dL (ref 8.9–10.3)
Chloride: 104 mmol/L (ref 101–111)
GFR calc Af Amer: 21 mL/min — ABNORMAL LOW (ref >60)
GFR calc non Af Amer: 18 mL/min — ABNORMAL LOW (ref >60)
GLUCOSE: 215 mg/dL — AB (ref 65–99)
Potassium: 4.4 mmol/L (ref 3.5–5.1)
Sodium: 138 mmol/L (ref 135–145)
TOTAL PROTEIN: 6.6 g/dL (ref 6.5–8.1)

## 2015-07-17 LAB — CBC
HCT: 33.8 % — ABNORMAL LOW (ref 36.0–46.0)
HEMOGLOBIN: 10.6 g/dL — AB (ref 12.0–15.0)
MCH: 30.8 pg (ref 26.0–34.0)
MCHC: 31.4 g/dL (ref 30.0–36.0)
MCV: 98.3 fL (ref 78.0–100.0)
PLATELETS: 242 10*3/uL (ref 150–400)
RBC: 3.44 MIL/uL — ABNORMAL LOW (ref 3.87–5.11)
RDW: 16.2 % — AB (ref 11.5–15.5)
WBC: 8.6 10*3/uL (ref 4.0–10.5)

## 2015-07-17 MED ORDER — SODIUM CHLORIDE 0.9 % IV SOLN
Freq: Once | INTRAVENOUS | Status: AC
  Administered 2015-07-17: 10:00:00 via INTRAVENOUS

## 2015-07-17 MED ORDER — DARBEPOETIN ALFA 150 MCG/0.3ML IJ SOSY
PREFILLED_SYRINGE | INTRAMUSCULAR | Status: AC
  Filled 2015-07-17: qty 0.3

## 2015-07-17 MED ORDER — SODIUM CHLORIDE 0.9 % IJ SOLN
10.0000 mL | INTRAMUSCULAR | Status: DC | PRN
  Administered 2015-07-17: 10 mL
  Filled 2015-07-17: qty 10

## 2015-07-17 MED ORDER — HEPARIN SOD (PORK) LOCK FLUSH 100 UNIT/ML IV SOLN
500.0000 [IU] | Freq: Once | INTRAVENOUS | Status: AC | PRN
  Administered 2015-07-17: 500 [IU]
  Filled 2015-07-17: qty 5

## 2015-07-17 MED ORDER — DIPHENHYDRAMINE HCL 25 MG PO CAPS
50.0000 mg | ORAL_CAPSULE | Freq: Once | ORAL | Status: AC
  Administered 2015-07-17: 50 mg via ORAL
  Filled 2015-07-17: qty 2

## 2015-07-17 MED ORDER — SODIUM CHLORIDE 0.9 % IV SOLN
375.0000 mg/m2 | Freq: Once | INTRAVENOUS | Status: AC
  Administered 2015-07-17: 800 mg via INTRAVENOUS
  Filled 2015-07-17: qty 70

## 2015-07-17 MED ORDER — DARBEPOETIN ALFA 150 MCG/0.3ML IJ SOSY
125.0000 ug | PREFILLED_SYRINGE | Freq: Once | INTRAMUSCULAR | Status: AC
  Administered 2015-07-17: 125 ug via SUBCUTANEOUS

## 2015-07-17 NOTE — Patient Instructions (Signed)
[facility] Discharge Instructions for Patients Receiving Chemotherapy  Today you received the following chemotherapy agents Rituxan. Hemoglobin 10.6, aranesp 125 mcg injection given as ordered. Return as scheduled.  To help prevent nausea and vomiting after your treatment, we encourage you to take your nausea medication as instructed. If you develop nausea and vomiting that is not controlled by your nausea medication, call the clinic. If it is after clinic hours your family physician or the after hours number for the clinic or go to the Emergency Department. BELOW ARE SYMPTOMS THAT SHOULD BE REPORTED IMMEDIATELY:  *FEVER GREATER THAN 101.0 F  *CHILLS WITH OR WITHOUT FEVER  NAUSEA AND VOMITING THAT IS NOT CONTROLLED WITH YOUR NAUSEA MEDICATION  *UNUSUAL SHORTNESS OF BREATH  *UNUSUAL BRUISING OR BLEEDING  TENDERNESS IN MOUTH AND THROAT WITH OR WITHOUT PRESENCE OF ULCERS  *URINARY PROBLEMS  *BOWEL PROBLEMS  UNUSUAL RASH Items with * indicate a potential emergency and should be followed up as soon as possible.  I have been informed and understand all the instructions given to me. I know to contact the clinic, my physician, or go to the Emergency Department if any problems should occur. I do not have any questions at this time, but understand that I may call the clinic during office hours or the Patient Navigator at [phone] should I have any questions or need assistance in obtaining follow up care.    __________________________________________  _____________  __________ Signature of Patient or Authorized Representative            Date                   Time    __________________________________________ Nurse's Signature

## 2015-07-17 NOTE — Progress Notes (Signed)
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 female returns for follow-up of her follicular lymphoma.   [name] [name] returns to the [facility] today with her husband. She is in the recliner, receiving her final treatment of Rituxan.  Today, she says she is feeling fine. Her one complaint is that she goes to the bathroom all night, about every hour or so. She says that she hasn't asked her doctor about this. She notes that if she drinks a lot of water, her urinary frequency gets worse, but she mentions that her mouth is just dry and that's why she drinks a lot. She adds that she often uses a pad to deal with her incontinence. She has never tried to use a pill to treat this. She says "I just thought it was because of my kidneys and everything."  She states that she has had her mammogram, and that it was ordered here at [facility]. She denies fever, chills, nausea or change in appetite. She denies night sweats or abdominal pain.   MEDICAL HISTORY: Past Medical History  Diagnosis Date  . Hypertension   . Diabetes mellitus, type 2 ([place])   . Hyperlipidemia   . Iron deficiency anemia 11/21/2012    At time of presentation.  Feraheme 1020 mg on 11/21/12.  . Follicular lymphoma ([place]) 11/20/2012    Right ureteral obstruction by lymphadenopathy-> right hydronephrosis  . Port catheter in place 06/06/2013  . Anemia of chronic renal failure, stage 4 (severe) ([place]) [id number]    has Follicular lymphoma ([place]); Iron deficiency anemia; Chronic kidney disease  (CKD), stage IV (severe) ([place]); Diabetes mellitus type 2 in obese ([place]); Hypertension; UTI (urinary tract infection); Leukocytosis, unspecified; Hypoglycemia; Port catheter in place; CKD (chronic kidney disease); Anemia of chronic renal failure, stage 4 (severe) ([place]); and Dysuria on her problem list.     has No Known Allergies.  Current Outpatient Prescriptions on File Prior to Visit  Medication Sig Dispense Refill  . ACCU-CHEK AVIVA PLUS test strip     . acetaminophen (TYLENOL) 325 MG tablet Take 650 mg by mouth every 6 (six) hours as needed for pain.    Marland Kitchen atorvastatin (LIPITOR) 80 MG tablet Take 80 mg by mouth every morning.    . BD PEN NEEDLE NANO U/F 32G X 4 MM MISC TEST BLOOD GLUCOSE ONCE DAILY  0  . furosemide (LASIX) 20 MG tablet Take 20 mg by mouth daily. One tablet 3 times a week    . lidocaine-prilocaine (EMLA) cream Apply 1 application topically as needed. 1 hour prior to Chemo 30 g prn  . losartan (COZAAR) 100 MG tablet Take 100 mg by mouth daily.    . Multiple Vitamin (MULTIVITAMIN WITH MINERALS) TABS Take 1 tablet by mouth daily.     Current Facility-Administered Medications on File Prior to Visit  Medication Dose Route Frequency Provider Last Rate Last Dose  . heparin lock flush 100 unit/mL  500 Units Intracatheter Once PRN Patrici Ranks, MD      . sodium chloride 0.9 % injection 10 mL  10 mL Intracatheter PRN Patrici Ranks, MD   10 mL at  07/17/15 0954    SURGICAL HISTORY: Past Surgical History  Procedure Laterality Date  . Cataract extraction    . Cystoscopy w/ ureteral stent placement Right 11/03/2012    Procedure: CYSTOSCOPY WITH RETROGRADE PYELOGRAM/URETERAL STENT PLACEMENT;  Surgeon: Malka So, MD;  Location: [facility];  Service: Urology;  Laterality: Right;  . Portacath placement Left 11/03/2012    Procedure: INSERTION PORT-A-CATH;  Surgeon: Scherry Ran, MD;  Location: [facility];  Service: General;  Laterality: Left;  Insertion Port-A-Cath Left Subclavian    . Appendectomy    . Cystoscopy w/ ureteral stent placement Right 03/23/2013    Procedure: CYSTOSCOPY WITH STENT REPLACEMENT;  Surgeon: Malka So, MD;  Location: [facility];  Service: Urology;  Laterality: Right;    SOCIAL HISTORY: Social History   Social History  . Marital Status: Married    Spouse Name: N/A  . Number of Children: N/A  . Years of Education: N/A   Occupational History  . Not on file.   Social History Main Topics  . Smoking status: Never Smoker   . Smokeless tobacco: Never Used  . Alcohol Use: No  . Drug Use: No  . Sexual Activity: No   Other Topics Concern  . Not on file   Social History Narrative    FAMILY HISTORY: Family History  Problem Relation Age of Onset  . Cancer Father   . Cancer Brother     Review of Systems  Constitutional:Negative for fever, chills and weight loss.  HENT: Negative for congestion, hearing loss, nosebleeds, sore throat and tinnitus.   Eyes: Negative for blurred vision, double vision, pain and discharge.  Respiratory: Negative for cough, hemoptysis, sputum production, shortness of breath and wheezing.   Cardiovascular: Negative for chest pain, palpitations, claudication, leg swelling and PND.  Gastrointestinal: Negative for heartburn, vomiting, abdominal pain, constipation, blood in stool and melena. Rare loose stool and nausea Genitourinary: Negative for hematuria. Positive for dysuria, urinary frequency Musculoskeletal: Negative for myalgias and falls. Positive for back pain Skin: Negative for itching and rash. Positive for lesion on r cheek Neurological: Negative for dizziness, tingling, tremors, sensory change, speech change, focal weakness, seizures, loss of consciousness, weakness and headaches.  Endo/Heme/Allergies: Does not bruise/bleed easily.  Psychiatric/Behavioral: Negative for depression, suicidal ideas, memory loss and substance abuse. The patient is not nervous/anxious and does not have insomnia.    14 point  review of systems was performed and is negative except as detailed under history of present illness and above   PHYSICAL EXAMINATION  ECOG PERFORMANCE STATUS: 1 - Symptomatic but completely ambulatory  Filed Vitals:   07/17/15 0931  BP: 184/64  Pulse: 77  Temp: 98.2 F (36.8 C)  Resp: 20    Physical Exam  Constitutional: She is oriented to person, place, and time and well-developed, well-nourished, and in no distress.  Obese  HENT:  Head: Normocephalic and atraumatic.  Nose: Nose normal.  Mouth/Throat: Oropharynx is clear and moist. No oropharyngeal exudate.  Eyes: Conjunctivae and EOM are normal. Pupils are equal, round, and reactive to light. Right eye exhibits no discharge. Left eye exhibits no discharge. No scleral icterus.  Neck: Normal range of motion. Neck supple. No tracheal deviation present. No thyromegaly present.  Cardiovascular: Normal rate and regular rhythm.  Exam reveals no gallop and no friction rub.   Murmur heard. Pulmonary/Chest: Effort normal and breath sounds normal. She has no wheezes. She has no rales.  Abdominal: Soft. Bowel sounds are normal. She exhibits no distension and no  mass. There is no tenderness. There is no rebound and no guarding.  Musculoskeletal: Normal range of motion. She exhibits edema.  Lymphadenopathy:    She has no cervical adenopathy.  Neurological: She is alert and oriented to person, place, and time. She has normal reflexes. No cranial nerve deficit. Gait normal. Coordination normal.  Skin: Skin is warm and dry. No rash noted.  Psychiatric: Mood, memory, affect and judgment normal.  Nursing note and vitals reviewed.   LABORATORY DATA: I have reviewed the data as listed  CBC    Component Value Date/Time   WBC 8.6 07/17/2015 0933   RBC 3.44* 07/17/2015 0933   RBC 3.17* 07/26/2014 0956   HGB 10.6* 07/17/2015 0933   HCT 33.8* 07/17/2015 0933   PLT 242 07/17/2015 0933   MCV 98.3 07/17/2015 0933   MCH 30.8 07/17/2015 0933    MCHC 31.4 07/17/2015 0933   RDW 16.2* 07/17/2015 0933   LYMPHSABS 0.9 05/19/2015 0930   MONOABS 0.5 05/19/2015 0930   EOSABS 0.2 05/19/2015 0930   BASOSABS 0.0 05/19/2015 0930   CMP     Component Value Date/Time   NA 138 07/17/2015 0933   K 4.4 07/17/2015 0933   CL 104 07/17/2015 0933   CO2 24 07/17/2015 0933   GLUCOSE 215* 07/17/2015 0933   BUN 45* 07/17/2015 0933   CREATININE 2.49* 07/17/2015 0933   CALCIUM 9.5 07/17/2015 0933   PROT 6.6 07/17/2015 0933   ALBUMIN 3.8 07/17/2015 0933   AST 21 07/17/2015 0933   ALT 18 07/17/2015 0933   ALKPHOS 92 07/17/2015 0933   BILITOT 0.4 07/17/2015 0933   GFRNONAA 18* 07/17/2015 0933   GFRAA 21* 07/17/2015 0933     ASSESSMENT and THERAPY PLAN:   Follicular lymphoma, Stage II History of R ureteral obstruction secondary to above Dysuria, urinary frequency, chronic CKD Anemia Skin lesion  75 year old female with follicular lymphoma, she was treated with Treanda and Rituxan. She is currently on Rituxan maintenance. She will finish Rituxan today.  She remains without evidence of recurrence. She is doing well.   Anemia is stable. She will continue on renal dosing of aranesp. She is due for a repeat ferritin. She will be given IV iron if needed. Goal is to maintain ferritin greater than 100.   I will see her back in 3 months, and if she's doing well, we will move her out to 4 months.   We will keep following her standing labs as ordered.  All questions were answered. The patient knows to call the clinic with any problems, questions or concerns. We can certainly see the patient much sooner if necessary.  This document serves as a record of services personally performed by Ancil Linsey, MD. It was created on her behalf by Toni Amend, a trained medical scribe. The creation of this record is based on the scribe's personal observations and the provider's statements to them. This document has been checked and approved by the  attending provider.  I have reviewed the above documentation for accuracy and completeness, and I agree with the above.  Kelby Fam. Rickie Gange MD

## 2015-07-17 NOTE — Progress Notes (Signed)
Patient reports taking pre-med Tylenol at home but didn't have any benadryl. Will give pre-med benadryl here before infusion.  [name] [name] presents today for injection per MD orders. Aranesp 125 mcg administered SQ in right Abdomen. Administration without incident. Patient tolerated well.  Tolerated rituxan infusion well. Ambulatory on discharge home with husband.

## 2015-07-17 NOTE — Patient Instructions (Signed)
[place] at [facility] Discharge Instructions  RECOMMENDATIONS MADE BY THE CONSULTANT AND ANY TEST RESULTS WILL BE SENT TO YOUR REFERRING PHYSICIAN.  Today is your last Rituxan treatment.   We will order your mammogram and schedule it for you.   You will return to [place] in three months.    Thank you for choosing [place] at [facility] to provide your oncology and hematology care.  To afford each patient quality time with our provider, please arrive at least 15 minutes before your scheduled appointment time.    You need to re-schedule your appointment should you arrive 10 or more minutes late.  We strive to give you quality time with our providers, and arriving late affects you and other patients whose appointments are after yours.  Also, if you no show three or more times for appointments you may be dismissed from the clinic at the providers discretion.     Again, thank you for choosing [facility].  Our hope is that these requests will decrease the amount of time that you wait before being seen by our physicians.       _____________________________________________________________  Should you have questions after your visit to [facility], please contact our office at [phone] between the hours of 8:30 a.m. and 4:30 p.m.  Voicemails left after 4:30 p.m. will not be returned until the following business day.  For prescription refill requests, have your pharmacy contact our office.

## 2015-07-31 ENCOUNTER — Encounter (HOSPITAL_BASED_OUTPATIENT_CLINIC_OR_DEPARTMENT_OTHER): Payer: Medicare Other

## 2015-07-31 ENCOUNTER — Encounter (HOSPITAL_COMMUNITY): Payer: Medicare Other

## 2015-07-31 VITALS — BP 137/52 | HR 91 | Temp 98.6°F | Resp 20

## 2015-07-31 DIAGNOSIS — N184 Chronic kidney disease, stage 4 (severe): Secondary | ICD-10-CM

## 2015-07-31 DIAGNOSIS — D631 Anemia in chronic kidney disease: Secondary | ICD-10-CM

## 2015-07-31 DIAGNOSIS — R300 Dysuria: Secondary | ICD-10-CM | POA: Diagnosis not present

## 2015-07-31 LAB — CBC
HCT: 33.1 % — ABNORMAL LOW (ref 36.0–46.0)
Hemoglobin: 10.5 g/dL — ABNORMAL LOW (ref 12.0–15.0)
MCH: 30.9 pg (ref 26.0–34.0)
MCHC: 31.7 g/dL (ref 30.0–36.0)
MCV: 97.4 fL (ref 78.0–100.0)
PLATELETS: 239 10*3/uL (ref 150–400)
RBC: 3.4 MIL/uL — AB (ref 3.87–5.11)
RDW: 16 % — ABNORMAL HIGH (ref 11.5–15.5)
WBC: 7.6 10*3/uL (ref 4.0–10.5)

## 2015-07-31 LAB — FERRITIN: Ferritin: 549 ng/mL — ABNORMAL HIGH (ref 11–307)

## 2015-07-31 MED ORDER — DARBEPOETIN ALFA 150 MCG/0.3ML IJ SOSY
125.0000 ug | PREFILLED_SYRINGE | Freq: Once | INTRAMUSCULAR | Status: AC
  Administered 2015-07-31: 125 ug via SUBCUTANEOUS

## 2015-07-31 MED ORDER — DARBEPOETIN ALFA 150 MCG/0.3ML IJ SOSY
PREFILLED_SYRINGE | INTRAMUSCULAR | Status: AC
  Filled 2015-07-31: qty 0.3

## 2015-07-31 NOTE — Patient Instructions (Signed)
[place] at [facility] Discharge Instructions  RECOMMENDATIONS MADE BY THE CONSULTANT AND ANY TEST RESULTS WILL BE SENT TO YOUR REFERRING PHYSICIAN.  Hemoglobin 10.5 today. Aranesp 125 mcg injection given as ordered. Return as scheduled.  Thank you for choosing [place] at [facility] to provide your oncology and hematology care.  To afford each patient quality time with our provider, please arrive at least 15 minutes before your scheduled appointment time.   Beginning January 23rd 2017 lab work for the [facility] will be done in the  Main lab at [facility] on 1st floor. If you have a lab appointment with the [facility] please come in thru the  Main Entrance and check in at the main information desk  You need to re-schedule your appointment should you arrive 10 or more minutes late.  We strive to give you quality time with our providers, and arriving late affects you and other patients whose appointments are after yours.  Also, if you no show three or more times for appointments you may be dismissed from the clinic at the providers discretion.     Again, thank you for choosing [facility].  Our hope is that these requests will decrease the amount of time that you wait before being seen by our physicians.       _____________________________________________________________  Should you have questions after your visit to [facility], please contact our office at [phone] between the hours of 8:30 a.m. and 4:30 p.m.  Voicemails left after 4:30 p.m. will not be returned until the following business day.  For prescription refill requests, have your pharmacy contact our office.

## 2015-07-31 NOTE — Progress Notes (Signed)
[name] [name] presents today for injection per MD orders. Aranesp 125mcg administered SQ in right Abdomen. Administration without incident. Patient tolerated well.  

## 2015-08-14 ENCOUNTER — Encounter (HOSPITAL_COMMUNITY): Payer: Medicare Other | Attending: Hematology & Oncology

## 2015-08-14 ENCOUNTER — Encounter (HOSPITAL_COMMUNITY): Payer: Medicare Other | Attending: Oncology

## 2015-08-14 VITALS — BP 153/55 | HR 70 | Temp 98.2°F | Resp 20

## 2015-08-14 DIAGNOSIS — N184 Chronic kidney disease, stage 4 (severe): Secondary | ICD-10-CM

## 2015-08-14 DIAGNOSIS — D631 Anemia in chronic kidney disease: Secondary | ICD-10-CM

## 2015-08-14 DIAGNOSIS — C8290 Follicular lymphoma, unspecified, unspecified site: Secondary | ICD-10-CM | POA: Insufficient documentation

## 2015-08-14 DIAGNOSIS — R300 Dysuria: Secondary | ICD-10-CM | POA: Diagnosis not present

## 2015-08-14 LAB — CBC
HCT: 31.1 % — ABNORMAL LOW (ref 36.0–46.0)
Hemoglobin: 9.8 g/dL — ABNORMAL LOW (ref 12.0–15.0)
MCH: 30.4 pg (ref 26.0–34.0)
MCHC: 31.5 g/dL (ref 30.0–36.0)
MCV: 96.6 fL (ref 78.0–100.0)
PLATELETS: 273 10*3/uL (ref 150–400)
RBC: 3.22 MIL/uL — ABNORMAL LOW (ref 3.87–5.11)
RDW: 16.3 % — AB (ref 11.5–15.5)
WBC: 8.5 10*3/uL (ref 4.0–10.5)

## 2015-08-14 MED ORDER — DARBEPOETIN ALFA 150 MCG/0.3ML IJ SOSY
PREFILLED_SYRINGE | INTRAMUSCULAR | Status: AC
  Filled 2015-08-14: qty 0.3

## 2015-08-14 MED ORDER — DARBEPOETIN ALFA 150 MCG/0.3ML IJ SOSY
125.0000 ug | PREFILLED_SYRINGE | Freq: Once | INTRAMUSCULAR | Status: AC
  Administered 2015-08-14: 125 ug via SUBCUTANEOUS

## 2015-08-14 NOTE — Progress Notes (Signed)
[name] [name] presents today for injection per MD orders. Aranesp 125 mcg administered SQ in left Abdomen. Administration without incident. Patient tolerated well.  

## 2015-08-14 NOTE — Patient Instructions (Signed)
[place] at [facility] Discharge Instructions  RECOMMENDATIONS MADE BY THE CONSULTANT AND ANY TEST RESULTS WILL BE SENT TO YOUR REFERRING PHYSICIAN.  Aranesp Today.  Please return in two weeks as scheduled.    Thank you for choosing [place] at [facility] to provide your oncology and hematology care.  To afford each patient quality time with our provider, please arrive at least 15 minutes before your scheduled appointment time.   Beginning January 23rd 2017 lab work for the [facility] will be done in the  Main lab at [facility] on 1st floor. If you have a lab appointment with the [place] please come in thru the  Main Entrance and check in at the main information desk  You need to re-schedule your appointment should you arrive 10 or more minutes late.  We strive to give you quality time with our providers, and arriving late affects you and other patients whose appointments are after yours.  Also, if you no show three or more times for appointments you may be dismissed from the clinic at the providers discretion.     Again, thank you for choosing [facility].  Our hope is that these requests will decrease the amount of time that you wait before being seen by our physicians.       _____________________________________________________________  Should you have questions after your visit to [facility], please contact our office at [phone] between the hours of 8:30 a.m. and 4:30 p.m.  Voicemails left after 4:30 p.m. will not be returned until the following business day.  For prescription refill requests, have your pharmacy contact our office.

## 2015-08-28 ENCOUNTER — Encounter (HOSPITAL_BASED_OUTPATIENT_CLINIC_OR_DEPARTMENT_OTHER): Payer: Medicare Other

## 2015-08-28 ENCOUNTER — Encounter (HOSPITAL_COMMUNITY): Payer: Self-pay

## 2015-08-28 ENCOUNTER — Encounter (HOSPITAL_COMMUNITY): Payer: Medicare Other

## 2015-08-28 VITALS — BP 165/58 | HR 70 | Temp 98.4°F | Resp 20

## 2015-08-28 DIAGNOSIS — N184 Chronic kidney disease, stage 4 (severe): Secondary | ICD-10-CM | POA: Diagnosis not present

## 2015-08-28 DIAGNOSIS — D631 Anemia in chronic kidney disease: Secondary | ICD-10-CM

## 2015-08-28 DIAGNOSIS — R300 Dysuria: Secondary | ICD-10-CM | POA: Diagnosis not present

## 2015-08-28 LAB — CBC
HEMATOCRIT: 29.6 % — AB (ref 36.0–46.0)
Hemoglobin: 9.5 g/dL — ABNORMAL LOW (ref 12.0–15.0)
MCH: 31.1 pg (ref 26.0–34.0)
MCHC: 32.1 g/dL (ref 30.0–36.0)
MCV: 97 fL (ref 78.0–100.0)
Platelets: 220 10*3/uL (ref 150–400)
RBC: 3.05 MIL/uL — AB (ref 3.87–5.11)
RDW: 16.6 % — ABNORMAL HIGH (ref 11.5–15.5)
WBC: 6.8 10*3/uL (ref 4.0–10.5)

## 2015-08-28 MED ORDER — DARBEPOETIN ALFA 150 MCG/0.3ML IJ SOSY
PREFILLED_SYRINGE | INTRAMUSCULAR | Status: AC
  Filled 2015-08-28: qty 0.3

## 2015-08-28 MED ORDER — DARBEPOETIN ALFA 150 MCG/0.3ML IJ SOSY
125.0000 ug | PREFILLED_SYRINGE | Freq: Once | INTRAMUSCULAR | Status: AC
  Administered 2015-08-28: 125 ug via SUBCUTANEOUS

## 2015-08-28 NOTE — Progress Notes (Signed)
..  [name] [name] presents today for injection per the provider's orders.  Aranesp administration without incident; see MAR for injection details.  Patient tolerated procedure well and without incident.  No questions or complaints noted at this time.

## 2015-08-28 NOTE — Patient Instructions (Signed)
Aranesp 125 mcg today

## 2015-09-11 ENCOUNTER — Encounter (HOSPITAL_COMMUNITY): Payer: Medicare Other | Attending: Oncology

## 2015-09-11 ENCOUNTER — Encounter (HOSPITAL_COMMUNITY): Payer: Medicare Other

## 2015-09-11 VITALS — BP 180/60 | HR 75 | Temp 98.2°F | Resp 20

## 2015-09-11 DIAGNOSIS — N184 Chronic kidney disease, stage 4 (severe): Secondary | ICD-10-CM | POA: Diagnosis present

## 2015-09-11 DIAGNOSIS — Z95828 Presence of other vascular implants and grafts: Secondary | ICD-10-CM

## 2015-09-11 DIAGNOSIS — R300 Dysuria: Secondary | ICD-10-CM | POA: Diagnosis not present

## 2015-09-11 DIAGNOSIS — D631 Anemia in chronic kidney disease: Secondary | ICD-10-CM | POA: Diagnosis not present

## 2015-09-11 DIAGNOSIS — C8290 Follicular lymphoma, unspecified, unspecified site: Secondary | ICD-10-CM | POA: Insufficient documentation

## 2015-09-11 LAB — CBC
HCT: 31.9 % — ABNORMAL LOW (ref 36.0–46.0)
Hemoglobin: 10 g/dL — ABNORMAL LOW (ref 12.0–15.0)
MCH: 30.4 pg (ref 26.0–34.0)
MCHC: 31.3 g/dL (ref 30.0–36.0)
MCV: 97 fL (ref 78.0–100.0)
Platelets: 227 10*3/uL (ref 150–400)
RBC: 3.29 MIL/uL — ABNORMAL LOW (ref 3.87–5.11)
RDW: 16.8 % — AB (ref 11.5–15.5)
WBC: 7.5 10*3/uL (ref 4.0–10.5)

## 2015-09-11 MED ORDER — SODIUM CHLORIDE 0.9% FLUSH
10.0000 mL | Freq: Once | INTRAVENOUS | Status: AC
  Administered 2015-09-11: 10 mL via INTRAVENOUS

## 2015-09-11 MED ORDER — DARBEPOETIN ALFA 150 MCG/0.3ML IJ SOSY
125.0000 ug | PREFILLED_SYRINGE | Freq: Once | INTRAMUSCULAR | Status: AC
  Administered 2015-09-11: 125 ug via SUBCUTANEOUS
  Filled 2015-09-11: qty 0.3

## 2015-09-11 MED ORDER — HEPARIN SOD (PORK) LOCK FLUSH 100 UNIT/ML IV SOLN
500.0000 [IU] | Freq: Once | INTRAVENOUS | Status: AC
Start: 2015-09-11 — End: 2015-09-11
  Administered 2015-09-11: 500 [IU] via INTRAVENOUS
  Filled 2015-09-11: qty 5

## 2015-09-11 NOTE — Progress Notes (Signed)
[name] [name] presents today for injection per MD orders. Aranesp 125 mcg administered SQ in right Abdomen. Administration without incident. Patient tolerated well.  [name] [name] presented for Portacath access and flush. Proper placement of portacath confirmed by CXR. Portacath located left chest wall accessed with  H 20 needle. No blood return present.  Portacath flushed with 35ml NS and 500U/85ml Heparin and needle removed intact. Procedure without incident. Patient tolerated procedure well.

## 2015-09-11 NOTE — Patient Instructions (Signed)
[place] at [facility] Discharge Instructions  RECOMMENDATIONS MADE BY THE CONSULTANT AND ANY TEST RESULTS WILL BE SENT TO YOUR REFERRING PHYSICIAN.  Hemoglobin 10.0. Aranesp 125 mcg injection given as ordered. Port flush done as scheduled. Return as scheduled.  Thank you for choosing [place] at [facility] to provide your oncology and hematology care.  To afford each patient quality time with our provider, please arrive at least 15 minutes before your scheduled appointment time.   Beginning January 23rd 2017 lab work for the [facility] will be done in the  Main lab at [facility] on 1st floor. If you have a lab appointment with the [facility] please come in thru the  Main Entrance and check in at the main information desk  You need to re-schedule your appointment should you arrive 10 or more minutes late.  We strive to give you quality time with our providers, and arriving late affects you and other patients whose appointments are after yours.  Also, if you no show three or more times for appointments you may be dismissed from the clinic at the providers discretion.     Again, thank you for choosing [facility].  Our hope is that these requests will decrease the amount of time that you wait before being seen by our physicians.       _____________________________________________________________  Should you have questions after your visit to [facility], please contact our office at [phone] between the hours of 8:30 a.m. and 4:30 p.m.  Voicemails left after 4:30 p.m. will not be returned until the following business day.  For prescription refill requests, have your pharmacy contact our office.         Resources For Cancer Patients and their Caregivers ? American Cancer Society: Can assist with transportation, wigs, general needs, runs Look Good Feel Better.        8306467933 ? Cancer  Care: Provides financial assistance, online support groups, medication/co-pay assistance.  1-800-813-HOPE (223)745-5842) ? [place] Assists [place] cancer patients and their families through emotional , educational and financial support.  561-304-5736 ? [place] DSS Where to apply for food stamps, Medicaid and utility assistance. 4803810260 ? [facility]: Transportation to medical appointments. 702 659 0047 ? Social Security Administration: May apply for disability if have a Stage IV cancer. 743-434-7666 548-606-1732 ? [employer], Disability and Transit Services: Assists with nutrition, care and transit needs. ([phone]

## 2015-09-25 ENCOUNTER — Encounter (HOSPITAL_BASED_OUTPATIENT_CLINIC_OR_DEPARTMENT_OTHER): Payer: Medicare Other

## 2015-09-25 ENCOUNTER — Encounter (HOSPITAL_COMMUNITY): Payer: Self-pay

## 2015-09-25 ENCOUNTER — Encounter (HOSPITAL_COMMUNITY): Payer: Medicare Other

## 2015-09-25 VITALS — BP 160/72 | HR 80 | Temp 96.0°F | Resp 20

## 2015-09-25 DIAGNOSIS — D631 Anemia in chronic kidney disease: Secondary | ICD-10-CM

## 2015-09-25 DIAGNOSIS — R300 Dysuria: Secondary | ICD-10-CM | POA: Diagnosis not present

## 2015-09-25 DIAGNOSIS — N184 Chronic kidney disease, stage 4 (severe): Secondary | ICD-10-CM

## 2015-09-25 LAB — CBC
HEMATOCRIT: 32.1 % — AB (ref 36.0–46.0)
Hemoglobin: 10.2 g/dL — ABNORMAL LOW (ref 12.0–15.0)
MCH: 30.9 pg (ref 26.0–34.0)
MCHC: 31.8 g/dL (ref 30.0–36.0)
MCV: 97.3 fL (ref 78.0–100.0)
PLATELETS: 262 10*3/uL (ref 150–400)
RBC: 3.3 MIL/uL — ABNORMAL LOW (ref 3.87–5.11)
RDW: 17.2 % — AB (ref 11.5–15.5)
WBC: 7.1 10*3/uL (ref 4.0–10.5)

## 2015-09-25 MED ORDER — DARBEPOETIN ALFA 150 MCG/0.3ML IJ SOSY
125.0000 ug | PREFILLED_SYRINGE | Freq: Once | INTRAMUSCULAR | Status: AC
  Administered 2015-09-25: 125 ug via SUBCUTANEOUS

## 2015-09-25 MED ORDER — DARBEPOETIN ALFA 150 MCG/0.3ML IJ SOSY
PREFILLED_SYRINGE | INTRAMUSCULAR | Status: AC
  Filled 2015-09-25: qty 0.3

## 2015-09-25 NOTE — Patient Instructions (Signed)
..  [place] at [facility] Discharge Instructions  RECOMMENDATIONS MADE BY THE CONSULTANT AND ANY TEST RESULTS WILL BE SENT TO YOUR REFERRING PHYSICIAN.  Aranesp today, return as scheduled  Thank you for choosing [place] at [facility] to provide your oncology and hematology care.  To afford each patient quality time with our provider, please arrive at least 15 minutes before your scheduled appointment time.   Beginning January 23rd 2017 lab work for the [facility] will be done in the  Main lab at [facility] on 1st floor. If you have a lab appointment with the [facility] please come in thru the  Main Entrance and check in at the main information desk  You need to re-schedule your appointment should you arrive 10 or more minutes late.  We strive to give you quality time with our providers, and arriving late affects you and other patients whose appointments are after yours.  Also, if you no show three or more times for appointments you may be dismissed from the clinic at the providers discretion.     Again, thank you for choosing [facility].  Our hope is that these requests will decrease the amount of time that you wait before being seen by our physicians.       _____________________________________________________________  Should you have questions after your visit to [facility], please contact our office at [phone] between the hours of 8:30 a.m. and 4:30 p.m.  Voicemails left after 4:30 p.m. will not be returned until the following business day.  For prescription refill requests, have your pharmacy contact our office.         Resources For Cancer Patients and their Caregivers ? American Cancer Society: Can assist with transportation, wigs, general needs, runs Look Good Feel Better.        7705212756 ? Cancer Care: Provides financial assistance, online support groups, medication/co-pay  assistance.  1-800-813-HOPE 210 842 2849) ? [place] Assists [place] cancer patients and their families through emotional , educational and financial support.  604-445-2014 ? [place] DSS Where to apply for food stamps, Medicaid and utility assistance. (432)393-9095 ? [facility]: Transportation to medical appointments. (931)702-8069 ? Social Security Administration: May apply for disability if have a Stage IV cancer. 985-294-1494 [phone] ? [employer], Disability and Transit Services: Assists with nutrition, care and transit needs. 2340683252

## 2015-09-25 NOTE — Progress Notes (Signed)
..  [name] [name] presents today for injection per the provider's orders.  aranesp administration without incident; see MAR for injection details.  Patient tolerated procedure well and without incident.  No questions or complaints noted at this time. 

## 2015-10-09 ENCOUNTER — Encounter (HOSPITAL_BASED_OUTPATIENT_CLINIC_OR_DEPARTMENT_OTHER): Payer: Medicare Other | Admitting: Oncology

## 2015-10-09 ENCOUNTER — Encounter (HOSPITAL_BASED_OUTPATIENT_CLINIC_OR_DEPARTMENT_OTHER): Payer: Medicare Other

## 2015-10-09 ENCOUNTER — Encounter (HOSPITAL_COMMUNITY): Payer: Medicare Other

## 2015-10-09 ENCOUNTER — Ambulatory Visit (HOSPITAL_COMMUNITY): Payer: Medicare Other | Admitting: Oncology

## 2015-10-09 ENCOUNTER — Encounter (HOSPITAL_COMMUNITY): Payer: Self-pay | Admitting: Oncology

## 2015-10-09 VITALS — BP 183/52 | HR 77 | Temp 98.1°F | Resp 18 | Wt 228.0 lb

## 2015-10-09 DIAGNOSIS — C8253 Diffuse follicle center lymphoma, intra-abdominal lymph nodes: Secondary | ICD-10-CM

## 2015-10-09 DIAGNOSIS — D631 Anemia in chronic kidney disease: Secondary | ICD-10-CM

## 2015-10-09 DIAGNOSIS — N184 Chronic kidney disease, stage 4 (severe): Secondary | ICD-10-CM | POA: Diagnosis not present

## 2015-10-09 DIAGNOSIS — R300 Dysuria: Secondary | ICD-10-CM | POA: Diagnosis not present

## 2015-10-09 LAB — COMPREHENSIVE METABOLIC PANEL
ALK PHOS: 91 U/L (ref 38–126)
ALT: 18 U/L (ref 14–54)
AST: 24 U/L (ref 15–41)
Albumin: 3.6 g/dL (ref 3.5–5.0)
Anion gap: 10 (ref 5–15)
BILIRUBIN TOTAL: 0.4 mg/dL (ref 0.3–1.2)
BUN: 43 mg/dL — AB (ref 6–20)
CO2: 23 mmol/L (ref 22–32)
CREATININE: 2.23 mg/dL — AB (ref 0.44–1.00)
Calcium: 9 mg/dL (ref 8.9–10.3)
Chloride: 107 mmol/L (ref 101–111)
GFR calc Af Amer: 24 mL/min — ABNORMAL LOW (ref >60)
GFR, EST NON AFRICAN AMERICAN: 21 mL/min — AB (ref >60)
GLUCOSE: 217 mg/dL — AB (ref 65–99)
Potassium: 4.5 mmol/L (ref 3.5–5.1)
Sodium: 140 mmol/L (ref 135–145)
TOTAL PROTEIN: 6.4 g/dL — AB (ref 6.5–8.1)

## 2015-10-09 LAB — CBC WITH DIFFERENTIAL/PLATELET
BASOS PCT: 0 %
Basophils Absolute: 0 10*3/uL (ref 0.0–0.1)
Eosinophils Absolute: 0.2 10*3/uL (ref 0.0–0.7)
Eosinophils Relative: 3 %
HEMATOCRIT: 32 % — AB (ref 36.0–46.0)
HEMOGLOBIN: 10.2 g/dL — AB (ref 12.0–15.0)
LYMPHS ABS: 1.2 10*3/uL (ref 0.7–4.0)
Lymphocytes Relative: 17 %
MCH: 30.6 pg (ref 26.0–34.0)
MCHC: 31.9 g/dL (ref 30.0–36.0)
MCV: 96.1 fL (ref 78.0–100.0)
MONOS PCT: 7 %
Monocytes Absolute: 0.5 10*3/uL (ref 0.1–1.0)
NEUTROS ABS: 5 10*3/uL (ref 1.7–7.7)
Neutrophils Relative %: 72 %
Platelets: 235 10*3/uL (ref 150–400)
RBC: 3.33 MIL/uL — AB (ref 3.87–5.11)
RDW: 16.9 % — AB (ref 11.5–15.5)
WBC: 6.9 10*3/uL (ref 4.0–10.5)

## 2015-10-09 LAB — LACTATE DEHYDROGENASE: LDH: 323 U/L — AB (ref 98–192)

## 2015-10-09 MED ORDER — DARBEPOETIN ALFA 150 MCG/0.3ML IJ SOSY
125.0000 ug | PREFILLED_SYRINGE | Freq: Once | INTRAMUSCULAR | Status: AC
  Administered 2015-10-09: 125 ug via SUBCUTANEOUS
  Filled 2015-10-09: qty 0.3

## 2015-10-09 NOTE — Patient Instructions (Signed)
[place] at [facility] Discharge Instructions  RECOMMENDATIONS MADE BY THE CONSULTANT AND ANY TEST RESULTS WILL BE SENT TO YOUR REFERRING PHYSICIAN.  For your sore throat, you are welcome to take anything over the counter.  You may also try to gargle with warm salt water.  You may have warm tea with honey and/or lemon.  The honey will help coat your sore throat and soothe it.  HOWEVER, be mindful of your sugar levels. Lab work today is stable.  Some labs are pending. You are due for CT scans based upon the guidelines.  We will get this set-up for you. Aranesp injection today and every 2 weeks. Labs every 2 weeks. Return in 3 months for follow-up, but with labs and injections every 2 weeks.    Thank you for choosing [place] at [facility] to provide your oncology and hematology care.  To afford each patient quality time with our provider, please arrive at least 15 minutes before your scheduled appointment time.   Beginning January 23rd 2017 lab work for the [facility] will be done in the  Main lab at [facility] on 1st floor. If you have a lab appointment with the [facility] please come in thru the  Main Entrance and check in at the main information desk  You need to re-schedule your appointment should you arrive 10 or more minutes late.  We strive to give you quality time with our providers, and arriving late affects you and other patients whose appointments are after yours.  Also, if you no show three or more times for appointments you may be dismissed from the clinic at the providers discretion.     Again, thank you for choosing [facility].  Our hope is that these requests will decrease the amount of time that you wait before being seen by our physicians.       _____________________________________________________________  Should you have questions after your visit to [facility], please contact our office at  [phone] between the hours of 8:30 a.m. and 4:30 p.m.  Voicemails left after 4:30 p.m. will not be returned until the following business day.  For prescription refill requests, have your pharmacy contact our office.         Resources For Cancer Patients and their Caregivers ? American Cancer Society: Can assist with transportation, wigs, general needs, runs Look Good Feel Better.        [phone] ? Cancer Care: Provides financial assistance, online support groups, medication/co-pay assistance.  1-800-813-HOPE 920-394-8943) ? [place] Assists [place] cancer patients and their families through emotional , educational and financial support.  215-083-6029 ? [place] DSS Where to apply for food stamps, Medicaid and utility assistance. [phone] ? RCATS: Transportation to medical appointments. [phone] ? Social Security Administration: May apply for disability if have a Stage IV cancer. [phone] ? [employer], Disability and Transit Services: Assists with nutrition, care and transit needs. [phone]

## 2015-10-09 NOTE — Patient Instructions (Signed)
[place] at [facility] Discharge Instructions  RECOMMENDATIONS MADE BY THE CONSULTANT AND ANY TEST RESULTS WILL BE SENT TO YOUR REFERRING PHYSICIAN.  Hemoglobin 10.2. Aranesp 125 mcg injection given as ordered.  Thank you for choosing [place] at [facility] to provide your oncology and hematology care.  To afford each patient quality time with our provider, please arrive at least 15 minutes before your scheduled appointment time.   Beginning January 23rd 2017 lab work for the [facility] will be done in the  Main lab at [facility] on 1st floor. If you have a lab appointment with the [facility] please come in thru the  Main Entrance and check in at the main information desk  You need to re-schedule your appointment should you arrive 10 or more minutes late.  We strive to give you quality time with our providers, and arriving late affects you and other patients whose appointments are after yours.  Also, if you no show three or more times for appointments you may be dismissed from the clinic at the providers discretion.     Again, thank you for choosing [facility].  Our hope is that these requests will decrease the amount of time that you wait before being seen by our physicians.       _____________________________________________________________  Should you have questions after your visit to [facility], please contact our office at [phone] between the hours of 8:30 a.m. and 4:30 p.m.  Voicemails left after 4:30 p.m. will not be returned until the following business day.  For prescription refill requests, have your pharmacy contact our office.         Resources For Cancer Patients and their Caregivers ? American Cancer Society: Can assist with transportation, wigs, general needs, runs Look Good Feel Better.        [phone] ? Cancer Care: Provides financial assistance, online support groups,  medication/co-pay assistance.  1-800-813-HOPE 717-684-7541) ? [place] Assists [place] cancer patients and their families through emotional , educational and financial support.  [phone] ? [place] DSS Where to apply for food stamps, Medicaid and utility assistance. 684-392-5464 ? RCATS: Transportation to medical appointments. [phone] ? Social Security Administration: May apply for disability if have a Stage IV cancer. [phone] ? [employer], Disability and Transit Services: Assists with nutrition, care and transit needs. [phone]

## 2015-10-09 NOTE — Progress Notes (Signed)
Keshara L Ballman presents today for injection per MD orders. Aranesp 125 mcg administered SQ in left Abdomen. Administration without incident. Patient tolerated well.  
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
## 2015-10-09 NOTE — Assessment & Plan Note (Addendum)
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  Oncology history updated.  Labs today and in 3 months: CBC diff, CMET, LDH  CT abd/pelvis wo contrast (due to renal function) in 3 months for surveillance per NCCN guidelines of follicular lymphoma.  Next mammogram is scheduled for 12/22/2015.  Return in 3 months for follow-up.

## 2015-10-09 NOTE — Assessment & Plan Note (Signed)
On 125 mcg of Aranesp every 2 weeks.  Will continue with this supportive therapy plan and adjust the dose accordingly.  Aranesp injection today.  Labs every 2 weeks: CBC  Ferritin every 90 days.

## 2015-10-23 ENCOUNTER — Encounter (HOSPITAL_COMMUNITY): Payer: Medicare Other | Attending: Oncology

## 2015-10-23 ENCOUNTER — Encounter (HOSPITAL_COMMUNITY): Payer: Medicare Other

## 2015-10-23 VITALS — BP 159/63 | HR 69 | Temp 98.5°F | Resp 20

## 2015-10-23 DIAGNOSIS — N184 Chronic kidney disease, stage 4 (severe): Principal | ICD-10-CM

## 2015-10-23 DIAGNOSIS — D631 Anemia in chronic kidney disease: Secondary | ICD-10-CM

## 2015-10-23 DIAGNOSIS — R300 Dysuria: Secondary | ICD-10-CM | POA: Insufficient documentation

## 2015-10-23 DIAGNOSIS — C8290 Follicular lymphoma, unspecified, unspecified site: Secondary | ICD-10-CM | POA: Insufficient documentation

## 2015-10-23 LAB — CBC
HEMATOCRIT: 31.5 % — AB (ref 36.0–46.0)
Hemoglobin: 10.1 g/dL — ABNORMAL LOW (ref 12.0–15.0)
MCH: 30.9 pg (ref 26.0–34.0)
MCHC: 32.1 g/dL (ref 30.0–36.0)
MCV: 96.3 fL (ref 78.0–100.0)
PLATELETS: 249 10*3/uL (ref 150–400)
RBC: 3.27 MIL/uL — AB (ref 3.87–5.11)
RDW: 17 % — ABNORMAL HIGH (ref 11.5–15.5)
WBC: 5.1 10*3/uL (ref 4.0–10.5)

## 2015-10-23 LAB — FERRITIN: Ferritin: 570 ng/mL — ABNORMAL HIGH (ref 11–307)

## 2015-10-23 MED ORDER — DARBEPOETIN ALFA 150 MCG/0.3ML IJ SOSY
125.0000 ug | PREFILLED_SYRINGE | Freq: Once | INTRAMUSCULAR | Status: AC
  Administered 2015-10-23: 125 ug via SUBCUTANEOUS
  Filled 2015-10-23: qty 0.3

## 2015-10-23 NOTE — Progress Notes (Signed)
[name] [name] presents today for injection per MD orders. Aranesp 125 mcg administered SQ in left Abdomen. Administration without incident. Patient tolerated well.  

## 2015-10-23 NOTE — Patient Instructions (Signed)
[place] at [facility] Discharge Instructions  RECOMMENDATIONS MADE BY THE CONSULTANT AND ANY TEST RESULTS WILL BE SENT TO YOUR REFERRING PHYSICIAN.  Hemoglobin 10.1. Aranesp 125 mcg injection given today as ordered. Return as scheduled.  Thank you for choosing [place] at [facility] to provide your oncology and hematology care.  To afford each patient quality time with our provider, please arrive at least 15 minutes before your scheduled appointment time.   Beginning January 23rd 2017 lab work for the [facility] will be done in the  Main lab at [facility] on 1st floor. If you have a lab appointment with the [facility] please come in thru the  Main Entrance and check in at the main information desk  You need to re-schedule your appointment should you arrive 10 or more minutes late.  We strive to give you quality time with our providers, and arriving late affects you and other patients whose appointments are after yours.  Also, if you no show three or more times for appointments you may be dismissed from the clinic at the providers discretion.     Again, thank you for choosing [facility].  Our hope is that these requests will decrease the amount of time that you wait before being seen by our physicians.       _____________________________________________________________  Should you have questions after your visit to [facility], please contact our office at [phone] between the hours of 8:30 a.m. and 4:30 p.m.  Voicemails left after 4:30 p.m. will not be returned until the following business day.  For prescription refill requests, have your pharmacy contact our office.         Resources For Cancer Patients and their Caregivers ? American Cancer Society: Can assist with transportation, wigs, general needs, runs Look Good Feel Better.        [phone] ? Cancer Care: Provides financial  assistance, online support groups, medication/co-pay assistance.  1-800-813-HOPE (386)184-0349) ? [place] Assists [place] cancer patients and their families through emotional , educational and financial support.  [phone] ? [place] DSS Where to apply for food stamps, Medicaid and utility assistance. [phone] ? RCATS: Transportation to medical appointments. [phone] ? Social Security Administration: May apply for disability if have a Stage IV cancer. [phone] ? [employer], Disability and Transit Services: Assists with nutrition, care and transit needs. [phone]

## 2015-11-06 ENCOUNTER — Encounter (HOSPITAL_BASED_OUTPATIENT_CLINIC_OR_DEPARTMENT_OTHER): Payer: Medicare Other

## 2015-11-06 ENCOUNTER — Encounter (HOSPITAL_COMMUNITY): Payer: Medicare Other

## 2015-11-06 ENCOUNTER — Other Ambulatory Visit (HOSPITAL_COMMUNITY): Payer: Medicare Other

## 2015-11-06 VITALS — BP 181/57 | HR 73 | Temp 98.5°F | Resp 20

## 2015-11-06 DIAGNOSIS — N184 Chronic kidney disease, stage 4 (severe): Secondary | ICD-10-CM | POA: Diagnosis not present

## 2015-11-06 DIAGNOSIS — D631 Anemia in chronic kidney disease: Secondary | ICD-10-CM

## 2015-11-06 DIAGNOSIS — R300 Dysuria: Secondary | ICD-10-CM | POA: Diagnosis not present

## 2015-11-06 DIAGNOSIS — Z95828 Presence of other vascular implants and grafts: Secondary | ICD-10-CM

## 2015-11-06 LAB — CBC
HEMATOCRIT: 31.5 % — AB (ref 36.0–46.0)
Hemoglobin: 10 g/dL — ABNORMAL LOW (ref 12.0–15.0)
MCH: 30.6 pg (ref 26.0–34.0)
MCHC: 31.7 g/dL (ref 30.0–36.0)
MCV: 96.3 fL (ref 78.0–100.0)
Platelets: 247 10*3/uL (ref 150–400)
RBC: 3.27 MIL/uL — ABNORMAL LOW (ref 3.87–5.11)
RDW: 17.4 % — AB (ref 11.5–15.5)
WBC: 5.6 10*3/uL (ref 4.0–10.5)

## 2015-11-06 MED ORDER — HEPARIN SOD (PORK) LOCK FLUSH 100 UNIT/ML IV SOLN
500.0000 [IU] | Freq: Once | INTRAVENOUS | Status: AC
  Administered 2015-11-06: 500 [IU] via INTRAVENOUS

## 2015-11-06 MED ORDER — HEPARIN SOD (PORK) LOCK FLUSH 100 UNIT/ML IV SOLN
INTRAVENOUS | Status: AC
  Filled 2015-11-06: qty 5

## 2015-11-06 MED ORDER — DARBEPOETIN ALFA 150 MCG/0.3ML IJ SOSY
125.0000 ug | PREFILLED_SYRINGE | Freq: Once | INTRAMUSCULAR | Status: AC
  Administered 2015-11-06: 125 ug via SUBCUTANEOUS

## 2015-11-06 MED ORDER — SODIUM CHLORIDE 0.9% FLUSH
10.0000 mL | Freq: Once | INTRAVENOUS | Status: AC
  Administered 2015-11-06: 10 mL via INTRAVENOUS

## 2015-11-06 MED ORDER — DARBEPOETIN ALFA 150 MCG/0.3ML IJ SOSY
PREFILLED_SYRINGE | INTRAMUSCULAR | Status: AC
  Filled 2015-11-06: qty 0.3

## 2015-11-06 NOTE — Progress Notes (Signed)
Hemoglobin 10.0. [name] [name] presents today for injection per MD orders. Aranesp 125 mcg administered SQ in left Abdomen. Administration without incident. Patient tolerated well.   [name] [name] presented for Portacath access and flush. Proper placement of portacath confirmed by CXR. Portacath located left chest wall accessed with  H 20 needle. No blood return present. Portacath flushed with 64ml NS and 500U/16ml Heparin and needle removed intact. Procedure without incident. Patient tolerated procedure well.

## 2015-11-06 NOTE — Patient Instructions (Signed)
[place] at [facility] Discharge Instructions  RECOMMENDATIONS MADE BY THE CONSULTANT AND ANY TEST RESULTS WILL BE SENT TO YOUR REFERRING PHYSICIAN.  Hemoglobin 10.0. Aranesp 125 mcg injection given as ordered.  Thank you for choosing [place] at [facility] to provide your oncology and hematology care.  To afford each patient quality time with our provider, please arrive at least 15 minutes before your scheduled appointment time.   Beginning January 23rd 2017 lab work for the [facility] will be done in the  Main lab at [facility] on 1st floor. If you have a lab appointment with the [facility] please come in thru the  Main Entrance and check in at the main information desk  You need to re-schedule your appointment should you arrive 10 or more minutes late.  We strive to give you quality time with our providers, and arriving late affects you and other patients whose appointments are after yours.  Also, if you no show three or more times for appointments you may be dismissed from the clinic at the providers discretion.     Again, thank you for choosing [facility].  Our hope is that these requests will decrease the amount of time that you wait before being seen by our physicians.       _____________________________________________________________  Should you have questions after your visit to [facility], please contact our office at [phone] between the hours of 8:30 a.m. and 4:30 p.m.  Voicemails left after 4:30 p.m. will not be returned until the following business day.  For prescription refill requests, have your pharmacy contact our office.         Resources For Cancer Patients and their Caregivers ? American Cancer Society: Can assist with transportation, wigs, general needs, runs Look Good Feel Better.        [phone] ? Cancer Care: Provides financial assistance, online support groups,  medication/co-pay assistance.  1-800-813-HOPE 640 860 1549) ? [place] Assists [place] cancer patients and their families through emotional , educational and financial support.  336-074-2000 ? [place] DSS Where to apply for food stamps, Medicaid and utility assistance. [phone] ? RCATS: Transportation to medical appointments. [phone] ? Social Security Administration: May apply for disability if have a Stage IV cancer. [phone] ? [employer], Disability and Transit Services: Assists with nutrition, care and transit needs. [phone]

## 2015-11-20 ENCOUNTER — Encounter (HOSPITAL_COMMUNITY): Payer: Medicare Other | Attending: Oncology

## 2015-11-20 ENCOUNTER — Encounter (HOSPITAL_COMMUNITY): Payer: Medicare Other | Attending: Hematology & Oncology

## 2015-11-20 ENCOUNTER — Encounter (HOSPITAL_COMMUNITY): Payer: Self-pay

## 2015-11-20 VITALS — BP 148/61 | HR 71 | Temp 98.2°F | Resp 20

## 2015-11-20 DIAGNOSIS — N184 Chronic kidney disease, stage 4 (severe): Secondary | ICD-10-CM | POA: Insufficient documentation

## 2015-11-20 DIAGNOSIS — C8290 Follicular lymphoma, unspecified, unspecified site: Secondary | ICD-10-CM | POA: Diagnosis present

## 2015-11-20 DIAGNOSIS — R300 Dysuria: Secondary | ICD-10-CM | POA: Diagnosis present

## 2015-11-20 DIAGNOSIS — D631 Anemia in chronic kidney disease: Secondary | ICD-10-CM

## 2015-11-20 LAB — CBC
HCT: 32.8 % — ABNORMAL LOW (ref 36.0–46.0)
Hemoglobin: 10.4 g/dL — ABNORMAL LOW (ref 12.0–15.0)
MCH: 30.9 pg (ref 26.0–34.0)
MCHC: 31.7 g/dL (ref 30.0–36.0)
MCV: 97.3 fL (ref 78.0–100.0)
PLATELETS: 245 10*3/uL (ref 150–400)
RBC: 3.37 MIL/uL — ABNORMAL LOW (ref 3.87–5.11)
RDW: 16.9 % — ABNORMAL HIGH (ref 11.5–15.5)
WBC: 5.6 10*3/uL (ref 4.0–10.5)

## 2015-11-20 MED ORDER — DARBEPOETIN ALFA 150 MCG/0.3ML IJ SOSY
125.0000 ug | PREFILLED_SYRINGE | Freq: Once | INTRAMUSCULAR | Status: AC
  Administered 2015-11-20: 125 ug via SUBCUTANEOUS
  Filled 2015-11-20: qty 0.3

## 2015-11-20 NOTE — Progress Notes (Signed)
[name] [name] presents today for injection per the provider's orders.  aranesp 125 mcg administration without incident; see MAR for injection details.  Patient tolerated procedure well and without incident.  No questions or complaints noted at this time.

## 2015-11-20 NOTE — Patient Instructions (Signed)
[place] at [facility] Discharge Instructions  RECOMMENDATIONS MADE BY THE CONSULTANT AND ANY TEST RESULTS WILL BE SENT TO YOUR REFERRING PHYSICIAN.   Hemoglobin 10.4 aranesp today Follow up as scheduled  Thank you for choosing [place] at [facility] to provide your oncology and hematology care.  To afford each patient quality time with our provider, please arrive at least 15 minutes before your scheduled appointment time.   Beginning January 23rd 2017 lab work for the [facility] will be done in the  Main lab at [facility] on 1st floor. If you have a lab appointment with the [facility] please come in thru the  Main Entrance and check in at the main information desk  You need to re-schedule your appointment should you arrive 10 or more minutes late.  We strive to give you quality time with our providers, and arriving late affects you and other patients whose appointments are after yours.  Also, if you no show three or more times for appointments you may be dismissed from the clinic at the providers discretion.     Again, thank you for choosing [facility].  Our hope is that these requests will decrease the amount of time that you wait before being seen by our physicians.       _____________________________________________________________  Should you have questions after your visit to [facility], please contact our office at [phone] between the hours of 8:30 a.m. and 4:30 p.m.  Voicemails left after 4:30 p.m. will not be returned until the following business day.  For prescription refill requests, have your pharmacy contact our office.         Resources For Cancer Patients and their Caregivers ? American Cancer Society: Can assist with transportation, wigs, general needs, runs Look Good Feel Better.        ([phone] ? Cancer Care: Provides financial assistance, online support groups,  medication/co-pay assistance.  1-800-813-HOPE [phone]) ? [place] Assists [place] cancer patients and their families through emotional , educational and financial support.  316-235-6147 ? [place] DSS Where to apply for food stamps, Medicaid and utility assistance. (856)559-3163 ? RCATS: Transportation to medical appointments. 4246612158 ? Social Security Administration: May apply for disability if have a Stage IV cancer. 629 352 1765 613-221-6335 ? [employer], Disability and Transit Services: Assists with nutrition, care and transit needs. [place] Support Programs: @10RELATIVEDAYS @ > Cancer Support Group  2nd Tuesday of the month 1pm-2pm, Journey Room  > Creative Journey  3rd Tuesday of the month 1130am-1pm, Journey Room  > Look Good Feel Better  1st Wednesday of the month 10am-12 noon, Journey Room (Call [place] to register [phone])

## 2015-12-04 ENCOUNTER — Other Ambulatory Visit (HOSPITAL_COMMUNITY): Payer: Self-pay | Admitting: Oncology

## 2015-12-04 ENCOUNTER — Encounter (HOSPITAL_BASED_OUTPATIENT_CLINIC_OR_DEPARTMENT_OTHER): Payer: Medicare Other

## 2015-12-04 ENCOUNTER — Encounter (HOSPITAL_COMMUNITY): Payer: Self-pay

## 2015-12-04 ENCOUNTER — Encounter (HOSPITAL_COMMUNITY): Payer: Medicare Other

## 2015-12-04 VITALS — BP 141/52 | HR 56 | Temp 98.2°F | Resp 18

## 2015-12-04 DIAGNOSIS — I10 Essential (primary) hypertension: Secondary | ICD-10-CM

## 2015-12-04 DIAGNOSIS — N184 Chronic kidney disease, stage 4 (severe): Secondary | ICD-10-CM | POA: Diagnosis not present

## 2015-12-04 DIAGNOSIS — R300 Dysuria: Secondary | ICD-10-CM | POA: Diagnosis not present

## 2015-12-04 DIAGNOSIS — D631 Anemia in chronic kidney disease: Secondary | ICD-10-CM

## 2015-12-04 LAB — CBC
HCT: 33.6 % — ABNORMAL LOW (ref 36.0–46.0)
HEMOGLOBIN: 10.5 g/dL — AB (ref 12.0–15.0)
MCH: 30.3 pg (ref 26.0–34.0)
MCHC: 31.3 g/dL (ref 30.0–36.0)
MCV: 97.1 fL (ref 78.0–100.0)
PLATELETS: 251 10*3/uL (ref 150–400)
RBC: 3.46 MIL/uL — ABNORMAL LOW (ref 3.87–5.11)
RDW: 16.8 % — ABNORMAL HIGH (ref 11.5–15.5)
WBC: 6 10*3/uL (ref 4.0–10.5)

## 2015-12-04 MED ORDER — DARBEPOETIN ALFA 150 MCG/0.3ML IJ SOSY
125.0000 ug | PREFILLED_SYRINGE | Freq: Once | INTRAMUSCULAR | Status: AC
  Administered 2015-12-04: 125 ug via SUBCUTANEOUS

## 2015-12-04 MED ORDER — DARBEPOETIN ALFA 150 MCG/0.3ML IJ SOSY
PREFILLED_SYRINGE | INTRAMUSCULAR | Status: AC
  Filled 2015-12-04: qty 0.3

## 2015-12-04 MED ORDER — CLONIDINE HCL 0.2 MG PO TABS
0.2000 mg | ORAL_TABLET | Freq: Once | ORAL | Status: AC
  Administered 2015-12-04: 0.2 mg via ORAL

## 2015-12-04 MED ORDER — CLONIDINE HCL 0.2 MG PO TABS
0.2000 mg | ORAL_TABLET | Freq: Once | ORAL | Status: DC
  Filled 2015-12-04: qty 1

## 2015-12-04 NOTE — Progress Notes (Signed)
[name] [name] presents today for injection per the provider's orders.  aranesp 125 mcg administration without incident; see MAR for injection details.  Patient tolerated procedure well and without incident.  No questions or complaints noted at this time.   1330 Blood pressure increased tom kefalas PA notified orders received.  Clonidine given b/p rechecked 1430. 141/52.  Kirby Crigler PA notified.  Ok to give aranesp

## 2015-12-04 NOTE — Patient Instructions (Signed)
[name] at [facility] Discharge Instructions  RECOMMENDATIONS MADE BY THE CONSULTANT AND ANY TEST RESULTS WILL BE SENT TO YOUR REFERRING PHYSICIAN.  Hemoglobin 10.5 today aranesp today Follow up as scheduled  Thank you for choosing [place] at [facility] to provide your oncology and hematology care.  To afford each patient quality time with our provider, please arrive at least 15 minutes before your scheduled appointment time.   Beginning January 23rd 2017 lab work for the [facility] will be done in the  Main lab at [facility] on 1st floor. If you have a lab appointment with the [facility] please come in thru the  Main Entrance and check in at the main information desk  You need to re-schedule your appointment should you arrive 10 or more minutes late.  We strive to give you quality time with our providers, and arriving late affects you and other patients whose appointments are after yours.  Also, if you no show three or more times for appointments you may be dismissed from the clinic at the providers discretion.     Again, thank you for choosing [facility].  Our hope is that these requests will decrease the amount of time that you wait before being seen by our physicians.       _____________________________________________________________  Should you have questions after your visit to [facility], please contact our office at [phone] between the hours of 8:30 a.m. and 4:30 p.m.  Voicemails left after 4:30 p.m. will not be returned until the following business day.  For prescription refill requests, have your pharmacy contact our office.         Resources For Cancer Patients and their Caregivers ? American Cancer Society: Can assist with transportation, wigs, general needs, runs Look Good Feel Better.        405-396-7951 ? Cancer Care: Provides financial assistance, online support groups,  medication/co-pay assistance.  1-800-813-HOPE (574)281-0732) ? [place] Assists [name] cancer patients and their families through emotional , educational and financial support.  859-862-8780 ? [place] DSS Where to apply for food stamps, Medicaid and utility assistance. 336-362-9470 ? RCATS: Transportation to medical appointments. 941-150-6042 ? Social Security Administration: May apply for disability if have a Stage IV cancer. 712 752 5026 [phone] ? [employer], Disability and Transit Services: Assists with nutrition, care and transit needs. [place] Support Programs: @10RELATIVEDAYS @ > Cancer Support Group  2nd Tuesday of the month 1pm-2pm, Journey Room  > Creative Journey  3rd Tuesday of the month 1130am-1pm, Journey Room  > Look Good Feel Better  1st Wednesday of the month 10am-12 noon, Journey Room (Call [place] to register [phone])

## 2015-12-18 ENCOUNTER — Encounter (HOSPITAL_COMMUNITY): Payer: Self-pay

## 2015-12-18 ENCOUNTER — Encounter (HOSPITAL_COMMUNITY): Payer: Medicare Other

## 2015-12-18 ENCOUNTER — Encounter (HOSPITAL_COMMUNITY): Payer: Medicare Other | Attending: Hematology & Oncology

## 2015-12-18 VITALS — BP 151/60 | HR 67 | Temp 98.4°F | Resp 18

## 2015-12-18 DIAGNOSIS — Z9889 Other specified postprocedural states: Secondary | ICD-10-CM | POA: Insufficient documentation

## 2015-12-18 DIAGNOSIS — N184 Chronic kidney disease, stage 4 (severe): Principal | ICD-10-CM

## 2015-12-18 DIAGNOSIS — D649 Anemia, unspecified: Secondary | ICD-10-CM | POA: Diagnosis not present

## 2015-12-18 DIAGNOSIS — Z79899 Other long term (current) drug therapy: Secondary | ICD-10-CM | POA: Insufficient documentation

## 2015-12-18 DIAGNOSIS — L989 Disorder of the skin and subcutaneous tissue, unspecified: Secondary | ICD-10-CM | POA: Diagnosis not present

## 2015-12-18 DIAGNOSIS — C8210 Follicular lymphoma grade II, unspecified site: Secondary | ICD-10-CM | POA: Diagnosis present

## 2015-12-18 DIAGNOSIS — D631 Anemia in chronic kidney disease: Secondary | ICD-10-CM

## 2015-12-18 DIAGNOSIS — R300 Dysuria: Secondary | ICD-10-CM | POA: Insufficient documentation

## 2015-12-18 DIAGNOSIS — E118 Type 2 diabetes mellitus with unspecified complications: Secondary | ICD-10-CM | POA: Diagnosis not present

## 2015-12-18 LAB — CBC
HEMATOCRIT: 32.7 % — AB (ref 36.0–46.0)
HEMOGLOBIN: 10.6 g/dL — AB (ref 12.0–15.0)
MCH: 31.5 pg (ref 26.0–34.0)
MCHC: 32.4 g/dL (ref 30.0–36.0)
MCV: 97 fL (ref 78.0–100.0)
Platelets: 238 10*3/uL (ref 150–400)
RBC: 3.37 MIL/uL — ABNORMAL LOW (ref 3.87–5.11)
RDW: 16.7 % — ABNORMAL HIGH (ref 11.5–15.5)
WBC: 6.1 10*3/uL (ref 4.0–10.5)

## 2015-12-18 MED ORDER — DARBEPOETIN ALFA 150 MCG/0.3ML IJ SOSY
PREFILLED_SYRINGE | INTRAMUSCULAR | Status: AC
  Filled 2015-12-18: qty 0.3

## 2015-12-18 MED ORDER — DARBEPOETIN ALFA 150 MCG/0.3ML IJ SOSY
125.0000 ug | PREFILLED_SYRINGE | Freq: Once | INTRAMUSCULAR | Status: AC
  Administered 2015-12-18: 125 ug via SUBCUTANEOUS

## 2015-12-18 MED ORDER — CLONIDINE HCL 0.2 MG PO TABS
0.2000 mg | ORAL_TABLET | Freq: Once | ORAL | Status: AC
  Administered 2015-12-18: 0.2 mg via ORAL
  Filled 2015-12-18: qty 1

## 2015-12-18 NOTE — Patient Instructions (Signed)
[place] at [facility] Discharge Instructions  RECOMMENDATIONS MADE BY THE CONSULTANT AND ANY TEST RESULTS WILL BE SENT TO YOUR REFERRING PHYSICIAN.   Hemoglobin 10.6  Follow up as scheduled Please call the clinic if you have any questions or concerns    Thank you for choosing [place] at [facility] to provide your oncology and hematology care.  To afford each patient quality time with our provider, please arrive at least 15 minutes before your scheduled appointment time.   Beginning January 23rd 2017 lab work for the [facility] will be done in the  Main lab at [facility] on 1st floor. If you have a lab appointment with the [facility] please come in thru the  Main Entrance and check in at the main information desk  You need to re-schedule your appointment should you arrive 10 or more minutes late.  We strive to give you quality time with our providers, and arriving late affects you and other patients whose appointments are after yours.  Also, if you no show three or more times for appointments you may be dismissed from the clinic at the providers discretion.     Again, thank you for choosing [facility].  Our hope is that these requests will decrease the amount of time that you wait before being seen by our physicians.       _____________________________________________________________  Should you have questions after your visit to [facility], please contact our office at [phone] between the hours of 8:30 a.m. and 4:30 p.m.  Voicemails left after 4:30 p.m. will not be returned until the following business day.  For prescription refill requests, have your pharmacy contact our office.         Resources For Cancer Patients and their Caregivers ? American Cancer Society: Can assist with transportation, wigs, general needs, runs Look Good Feel Better.        [phone] ? Cancer Care: Provides  financial assistance, online support groups, medication/co-pay assistance.  1-800-813-HOPE 312-430-4183) ? [place] Assists [place] cancer patients and their families through emotional , educational and financial support.  ([phone] ? [place] DSS Where to apply for food stamps, Medicaid and utility assistance. 509-486-7711 ? RCATS: Transportation to medical appointments. [phone]) [phone] ? Social Security Administration: May apply for disability if have a Stage IV cancer. 445-427-3537 517-141-5696 ? [employer], Disability and Transit Services: Assists with nutrition, care and transit needs. [place] Support Programs: @10RELATIVEDAYS @ > Cancer Support Group  2nd Tuesday of the month 1pm-2pm, Journey Room  > Creative Journey  3rd Tuesday of the month 1130am-1pm, Journey Room  > Look Good Feel Better  1st Wednesday of the month 10am-12 noon, Journey Room (Call [place] to register [phone])

## 2015-12-18 NOTE — Progress Notes (Signed)
1400 Blood pressure elevated 190/58.  Kirby Crigler PA notified.  Ordered received to give clonidine 0.2 mg, given at 1420, blood pressure re-checked at 1445 151/60.  Aranesp 125 mcg given per MD orders.

## 2015-12-22 ENCOUNTER — Ambulatory Visit (HOSPITAL_COMMUNITY)
Admission: RE | Admit: 2015-12-22 | Discharge: 2015-12-22 | Disposition: A | Discharge status: Home / Self Care | Payer: Medicare Other | Source: Ambulatory Visit | Attending: Family Medicine | Admitting: Family Medicine

## 2015-12-22 ENCOUNTER — Other Ambulatory Visit (HOSPITAL_COMMUNITY): Payer: Self-pay | Admitting: Hematology & Oncology

## 2015-12-22 ENCOUNTER — Ambulatory Visit (HOSPITAL_COMMUNITY)
Admission: RE | Admit: 2015-12-22 | Discharge: 2015-12-22 | Disposition: A | Discharge status: Home / Self Care | Payer: Medicare Other | Source: Ambulatory Visit | Attending: Hematology & Oncology | Admitting: Hematology & Oncology

## 2015-12-22 ENCOUNTER — Other Ambulatory Visit (HOSPITAL_BASED_OUTPATIENT_CLINIC_OR_DEPARTMENT_OTHER): Payer: Self-pay | Admitting: Family Medicine

## 2015-12-22 ENCOUNTER — Other Ambulatory Visit (HOSPITAL_COMMUNITY): Payer: Self-pay | Admitting: Family Medicine

## 2015-12-22 DIAGNOSIS — Z1231 Encounter for screening mammogram for malignant neoplasm of breast: Secondary | ICD-10-CM

## 2015-12-22 DIAGNOSIS — N6452 Nipple discharge: Secondary | ICD-10-CM

## 2015-12-22 DIAGNOSIS — N644 Mastodynia: Secondary | ICD-10-CM

## 2015-12-23 ENCOUNTER — Ambulatory Visit (HOSPITAL_COMMUNITY)
Admission: RE | Admit: 2015-12-23 | Discharge: 2015-12-23 | Disposition: A | Discharge status: Home / Self Care | Payer: Medicare Other | Source: Ambulatory Visit | Attending: Hematology & Oncology | Admitting: Hematology & Oncology

## 2015-12-23 ENCOUNTER — Other Ambulatory Visit (HOSPITAL_COMMUNITY): Payer: Self-pay | Admitting: Hematology & Oncology

## 2015-12-23 DIAGNOSIS — N6452 Nipple discharge: Secondary | ICD-10-CM

## 2015-12-23 DIAGNOSIS — N644 Mastodynia: Secondary | ICD-10-CM | POA: Insufficient documentation

## 2015-12-23 DIAGNOSIS — R234 Changes in skin texture: Secondary | ICD-10-CM | POA: Diagnosis not present

## 2015-12-23 DIAGNOSIS — N6489 Other specified disorders of breast: Secondary | ICD-10-CM | POA: Diagnosis not present

## 2015-12-23 DIAGNOSIS — R928 Other abnormal and inconclusive findings on diagnostic imaging of breast: Secondary | ICD-10-CM

## 2015-12-29 ENCOUNTER — Ambulatory Visit (HOSPITAL_COMMUNITY): Payer: Medicare Other

## 2015-12-30 ENCOUNTER — Ambulatory Visit
Admission: RE | Admit: 2015-12-30 | Discharge: 2015-12-30 | Disposition: A | Discharge status: Home / Self Care | Payer: Medicare Other | Source: Ambulatory Visit | Attending: Hematology & Oncology | Admitting: Hematology & Oncology

## 2015-12-30 ENCOUNTER — Other Ambulatory Visit (HOSPITAL_COMMUNITY): Payer: Self-pay | Admitting: Hematology & Oncology

## 2015-12-30 DIAGNOSIS — R928 Other abnormal and inconclusive findings on diagnostic imaging of breast: Secondary | ICD-10-CM

## 2016-01-01 ENCOUNTER — Encounter (HOSPITAL_COMMUNITY): Payer: Self-pay

## 2016-01-01 ENCOUNTER — Encounter (HOSPITAL_BASED_OUTPATIENT_CLINIC_OR_DEPARTMENT_OTHER): Payer: Medicare Other

## 2016-01-01 ENCOUNTER — Encounter (HOSPITAL_COMMUNITY): Payer: Medicare Other

## 2016-01-01 VITALS — BP 156/62 | HR 73 | Temp 98.2°F | Resp 18

## 2016-01-01 DIAGNOSIS — D631 Anemia in chronic kidney disease: Secondary | ICD-10-CM

## 2016-01-01 DIAGNOSIS — N184 Chronic kidney disease, stage 4 (severe): Secondary | ICD-10-CM

## 2016-01-01 DIAGNOSIS — C8253 Diffuse follicle center lymphoma, intra-abdominal lymph nodes: Secondary | ICD-10-CM

## 2016-01-01 DIAGNOSIS — C8210 Follicular lymphoma grade II, unspecified site: Secondary | ICD-10-CM | POA: Diagnosis not present

## 2016-01-01 LAB — COMPREHENSIVE METABOLIC PANEL
ALBUMIN: 3.6 g/dL (ref 3.5–5.0)
ALK PHOS: 82 U/L (ref 38–126)
ALT: 17 U/L (ref 14–54)
AST: 25 U/L (ref 15–41)
Anion gap: 7 (ref 5–15)
BILIRUBIN TOTAL: 0.5 mg/dL (ref 0.3–1.2)
BUN: 45 mg/dL — AB (ref 6–20)
CO2: 26 mmol/L (ref 22–32)
Calcium: 9.3 mg/dL (ref 8.9–10.3)
Chloride: 104 mmol/L (ref 101–111)
Creatinine, Ser: 2.32 mg/dL — ABNORMAL HIGH (ref 0.44–1.00)
GFR calc Af Amer: 23 mL/min — ABNORMAL LOW (ref >60)
GFR calc non Af Amer: 20 mL/min — ABNORMAL LOW (ref >60)
GLUCOSE: 242 mg/dL — AB (ref 65–99)
POTASSIUM: 4.3 mmol/L (ref 3.5–5.1)
Sodium: 137 mmol/L (ref 135–145)
TOTAL PROTEIN: 6.7 g/dL (ref 6.5–8.1)

## 2016-01-01 LAB — CBC WITH DIFFERENTIAL/PLATELET
Basophils Absolute: 0 10*3/uL (ref 0.0–0.1)
Basophils Relative: 1 %
Eosinophils Absolute: 0.3 10*3/uL (ref 0.0–0.7)
Eosinophils Relative: 5 %
HEMATOCRIT: 32.9 % — AB (ref 36.0–46.0)
HEMOGLOBIN: 10.4 g/dL — AB (ref 12.0–15.0)
LYMPHS ABS: 1 10*3/uL (ref 0.7–4.0)
Lymphocytes Relative: 18 %
MCH: 30.6 pg (ref 26.0–34.0)
MCHC: 31.6 g/dL (ref 30.0–36.0)
MCV: 96.8 fL (ref 78.0–100.0)
MONOS PCT: 8 %
Monocytes Absolute: 0.4 10*3/uL (ref 0.1–1.0)
NEUTROS ABS: 3.9 10*3/uL (ref 1.7–7.7)
NEUTROS PCT: 68 %
Platelets: 243 10*3/uL (ref 150–400)
RBC: 3.4 MIL/uL — ABNORMAL LOW (ref 3.87–5.11)
RDW: 16.7 % — ABNORMAL HIGH (ref 11.5–15.5)
WBC: 5.6 10*3/uL (ref 4.0–10.5)

## 2016-01-01 LAB — LACTATE DEHYDROGENASE: LDH: 373 U/L — ABNORMAL HIGH (ref 98–192)

## 2016-01-01 MED ORDER — DARBEPOETIN ALFA 150 MCG/0.3ML IJ SOSY
PREFILLED_SYRINGE | INTRAMUSCULAR | Status: AC
  Filled 2016-01-01: qty 0.3

## 2016-01-01 MED ORDER — DARBEPOETIN ALFA 150 MCG/0.3ML IJ SOSY
125.0000 ug | PREFILLED_SYRINGE | Freq: Once | INTRAMUSCULAR | Status: AC
Start: 2016-01-01 — End: 2016-01-01
  Administered 2016-01-01: 125 ug via SUBCUTANEOUS

## 2016-01-01 NOTE — Patient Instructions (Signed)
[place] at [facility] Discharge Instructions  RECOMMENDATIONS MADE BY THE CONSULTANT AND ANY TEST RESULTS WILL BE SENT TO YOUR REFERRING PHYSICIAN.  Hemoglobin 10.4 aranesp today follow up as scheduled Please call the clinic if you have any questions or concerns   Thank you for choosing [place] at [facility] to provide your oncology and hematology care.  To afford each patient quality time with our provider, please arrive at least 15 minutes before your scheduled appointment time.   Beginning January 23rd 2017 lab work for the [facility] will be done in the  Main lab at [facility] on 1st floor. If you have a lab appointment with the [facility] please come in thru the  Main Entrance and check in at the main information desk  You need to re-schedule your appointment should you arrive 10 or more minutes late.  We strive to give you quality time with our providers, and arriving late affects you and other patients whose appointments are after yours.  Also, if you no show three or more times for appointments you may be dismissed from the clinic at the providers discretion.     Again, thank you for choosing [facility].  Our hope is that these requests will decrease the amount of time that you wait before being seen by our physicians.       _____________________________________________________________  Should you have questions after your visit to [facility], please contact our office at [phone] between the hours of 8:30 a.m. and 4:30 p.m.  Voicemails left after 4:30 p.m. will not be returned until the following business day.  For prescription refill requests, have your pharmacy contact our office.         Resources For Cancer Patients and their Caregivers ? American Cancer Society: Can assist with transportation, wigs, general needs, runs Look Good Feel Better.        6173890833 ? Cancer  Care: Provides financial assistance, online support groups, medication/co-pay assistance.  1-800-813-HOPE 628-745-2511) ? [place] Assists [place] cancer patients and their families through emotional , educational and financial support.  703-354-0455 ? [place] DSS Where to apply for food stamps, Medicaid and utility assistance. 912-386-5211 ? RCATS: Transportation to medical appointments. 514-722-1856 ? Social Security Administration: May apply for disability if have a Stage IV cancer. [phone] 720-148-9368 ? [employer], Disability and Transit Services: Assists with nutrition, care and transit needs. [place] Support Programs: @10RELATIVEDAYS @ > Cancer Support Group  2nd Tuesday of the month 1pm-2pm, Journey Room  > Creative Journey  3rd Tuesday of the month 1130am-1pm, Journey Room  > Look Good Feel Better  1st Wednesday of the month 10am-12 noon, Journey Room (Call [place] to register [phone])

## 2016-01-07 ENCOUNTER — Encounter (HOSPITAL_BASED_OUTPATIENT_CLINIC_OR_DEPARTMENT_OTHER): Payer: Medicare Other

## 2016-01-07 ENCOUNTER — Encounter (HOSPITAL_BASED_OUTPATIENT_CLINIC_OR_DEPARTMENT_OTHER): Payer: Medicare Other | Admitting: Hematology & Oncology

## 2016-01-07 ENCOUNTER — Encounter (HOSPITAL_COMMUNITY): Payer: Self-pay | Admitting: Hematology & Oncology

## 2016-01-07 VITALS — BP 159/52 | HR 86 | Temp 98.6°F | Resp 18 | Wt 231.8 lb

## 2016-01-07 DIAGNOSIS — N184 Chronic kidney disease, stage 4 (severe): Secondary | ICD-10-CM

## 2016-01-07 DIAGNOSIS — Z8572 Personal history of non-Hodgkin lymphomas: Secondary | ICD-10-CM

## 2016-01-07 DIAGNOSIS — C8253 Diffuse follicle center lymphoma, intra-abdominal lymph nodes: Secondary | ICD-10-CM | POA: Diagnosis not present

## 2016-01-07 DIAGNOSIS — C8236 Follicular lymphoma grade IIIa, intrapelvic lymph nodes: Secondary | ICD-10-CM

## 2016-01-07 DIAGNOSIS — D631 Anemia in chronic kidney disease: Secondary | ICD-10-CM

## 2016-01-07 DIAGNOSIS — R928 Other abnormal and inconclusive findings on diagnostic imaging of breast: Secondary | ICD-10-CM

## 2016-01-07 MED ORDER — SODIUM CHLORIDE 0.9% FLUSH
10.0000 mL | INTRAVENOUS | Status: DC | PRN
Start: 2016-01-07 — End: 2016-01-07
  Administered 2016-01-07: 10 mL via INTRAVENOUS
  Filled 2016-01-07: qty 10

## 2016-01-07 MED ORDER — HEPARIN SOD (PORK) LOCK FLUSH 100 UNIT/ML IV SOLN
500.0000 [IU] | Freq: Once | INTRAVENOUS | Status: AC
  Administered 2016-01-07: 500 [IU] via INTRAVENOUS
  Filled 2016-01-07: qty 5

## 2016-01-07 NOTE — Patient Instructions (Addendum)
[facility] at [facility] Discharge Instructions  RECOMMENDATIONS MADE BY THE CONSULTANT AND ANY TEST RESULTS WILL BE SENT TO YOUR REFERRING PHYSICIAN.  Port flush done today. Follow up as scheduled.  Thank you for choosing [facility] at [facility] to provide your oncology and hematology care.  To afford each patient quality time with our provider, please arrive at least 15 minutes before your scheduled appointment time.   Beginning January 23rd 2017 lab work for the Cancer Center will be done in the  Main lab at [facility] on 1st floor. If you have a lab appointment with the Cancer Center please come in thru the  Main Entrance and check in at the main information desk  You need to re-schedule your appointment should you arrive 10 or more minutes late.  We strive to give you quality time with our providers, and arriving late affects you and other patients whose appointments are after yours.  Also, if you no show three or more times for appointments you may be dismissed from the clinic at the providers discretion.     Again, thank you for choosing [facility].  Our hope is that these requests will decrease the amount of time that you wait before being seen by our physicians.       _____________________________________________________________  Should you have questions after your visit to [facility], please contact our office at [phone] between the hours of 8:30 a.m. and 4:30 p.m.  Voicemails left after 4:30 p.m. will not be returned until the following business day.  For prescription refill requests, have your pharmacy contact our office.         Resources For Cancer Patients and their Caregivers ? American Cancer Society: Can assist with transportation, wigs, general needs, runs Look Good Feel Better.        1-888-227-6333 ? Cancer Care: Provides financial assistance, online support groups,  medication/co-pay assistance.  1-800-813-HOPE (4673) ? [facility] Assists [place] cancer patients and their families through emotional , educational and financial support.  336-427-4357 ? [place] DSS Where to apply for food stamps, Medicaid and utility assistance. 336-342-1394 ? RCATS: Transportation to medical appointments. 336-347-2287 ? Social Security Administration: May apply for disability if have a Stage IV cancer. 336-342-7796 1-800-772-1213 ? [place] Aging, Disability and Transit Services: Assists with nutrition, care and transit needs. 336-349-2343  [facility] Support Programs: @10RELATIVEDAYS@ > Cancer Support Group  2nd Tuesday of the month 1pm-2pm, Journey Room  > Creative Journey  3rd Tuesday of the month 1130am-1pm, Journey Room  > Look Good Feel Better  1st Wednesday of the month 10am-12 noon, Journey Room (Call [employer] to register [phone])   

## 2016-01-07 NOTE — Progress Notes (Signed)
[name] [name] presented for Portacath access and flush. Portacath located left chest wall accessed with  H 20 needle. No blood return and patient reports "it feels the same", nothing different. No resistance felt during flush. Portacath flushed with 64ml NS and 500U/55ml Heparin and needle removed intact. Procedure without incident. Patient tolerated procedure well.

## 2016-01-07 NOTE — Progress Notes (Signed)
Please see front office encounter for more information 

## 2016-01-07 NOTE — Patient Instructions (Signed)
[place] at [facility] Discharge Instructions  RECOMMENDATIONS MADE BY THE CONSULTANT AND ANY TEST RESULTS WILL BE SENT TO YOUR REFERRING PHYSICIAN.  Exam done and seen today by Dr. Whitney Muse Same schedule for labs and injections. We will see what DR.Jenkins says after biopsy Return to see the doctor in 3 months for labs and follow up Please call the clinic if you have any questions or concerns   Thank you for choosing [place] at [facility] to provide your oncology and hematology care.  To afford each patient quality time with our provider, please arrive at least 15 minutes before your scheduled appointment time.   Beginning January 23rd 2017 lab work for the [facility] will be done in the  Main lab at [facility] on 1st floor. If you have a lab appointment with the [facility] please come in thru the  Main Entrance and check in at the main information desk  You need to re-schedule your appointment should you arrive 10 or more minutes late.  We strive to give you quality time with our providers, and arriving late affects you and other patients whose appointments are after yours.  Also, if you no show three or more times for appointments you may be dismissed from the clinic at the providers discretion.     Again, thank you for choosing [facility].  Our hope is that these requests will decrease the amount of time that you wait before being seen by our physicians.       _____________________________________________________________  Should you have questions after your visit to [facility], please contact our office at [phone] between the hours of 8:30 a.m. and 4:30 p.m.  Voicemails left after 4:30 p.m. will not be returned until the following business day.  For prescription refill requests, have your pharmacy contact our office.         Resources For Cancer Patients and their Caregivers ? American  Cancer Society: Can assist with transportation, wigs, general needs, runs Look Good Feel Better.        469-838-2188 ? Cancer Care: Provides financial assistance, online support groups, medication/co-pay assistance.  1-800-813-HOPE 769-404-7165) ? [place] Assists [place] cancer patients and their families through emotional , educational and financial support.  848-145-1912 ? [place] DSS Where to apply for food stamps, Medicaid and utility assistance. 937-414-3969 ? RCATS: Transportation to medical appointments. 873-568-4005 ? Social Security Administration: May apply for disability if have a Stage IV cancer. [phone] 478-695-4030 ? [employer], Disability and Transit Services: Assists with nutrition, care and transit needs. [place] Support Programs: @10RELATIVEDAYS @ > Cancer Support Group  2nd Tuesday of the month 1pm-2pm, Journey Room  > Creative Journey  3rd Tuesday of the month 1130am-1pm, Journey Room  > Look Good Feel Better  1st Wednesday of the month 10am-12 noon, Journey Room (Call [place] to register [phone])

## 2016-01-07 NOTE — Progress Notes (Signed)
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 This encases the right ureter with severe right hydroureteronephrosis. Although the mass is nonspecifi...   09/29/2012 Imaging MRI pelvis- Large approximately 9 cm retroperitoneal mass, which encases the common iliac vessels and extends into the presacral space and right S1 neural foramen. Differential diagnosis includes lymphoma, retroperitoneal sarcoma, and metastatic diseas   10/11/2012 Initial Diagnosis Diagnosis Retroperitoneal mass, biopsy - NON-HODGKIN'S B CELL LYMPHOMA.   11/07/2012 PET scan Large presacral hypermetabolic nodal mass with multiple borderline enlarged and minimally enlarged but hypermetabolic lymph nodes in the retroperitoneum, the middle mediastinum, and left supraclavicular region, as above.   11/28/2012 - 04/26/2013 Chemotherapy BR x 6 cycles   02/07/2013 Imaging CT abd/pelvis- Interval decrease in size of the paraspinous/presacral nodal mass encasing the IVC and aortic bifurcations.   05/08/2013 Remission CT CAP- No evidence of thoracic lymphoma.  Stable presacral mass with associated chronic right ureteral obstruction. The double-J right ureteral stent is unchanged in position. 2. No evidence progressive abdominal pelvic lymphoma.   07/25/2013 - 07/17/2015 Chemotherapy Maintenance Rituxan x 2 years     CURRENT THERAPY: Observation  INTERVAL HISTORY: [name] [name] 75 y.o. female  returns for follow-up of her follicular lymphoma. She had an abnormal screening mammogram on 6/13 which led to a biopsy. Final pathology, FIBROCYSTIC CHANGES WITH FOCAL USUAL DUCTAL HYPERPLASIA AND ASSOCIATED CALCIFICATION, BENIGN DUCTS WITH ASSOCIATED CALCIFICATION of the Left lateral breast. Given complaints of nipple discharge she was referred to Dr. Arnoldo Morale for surgical evaluation.   [name] [name] is accompanied by her husband..   Notes she had a stool sample tested and it came back all right.   She has been eating well, but was told she lost 4 lbs by her PCP, Dr. Edrick Oh. Her husband planted a garden and she eats a lot of things from it.   While getting onto the exam table, she notes her knees don't bend like they are supposed to.   She denies any belly pain or nausea. She realistically is at her baseline. She is relatively inactive. Her husband helps her with a lot of her ADL's. Her appetite is good. No night sweats, fever or chills.    MEDICAL HISTORY: Past Medical History  Diagnosis Date  . Hypertension   . Diabetes mellitus, type 2 ([place])   . Hyperlipidemia   . Iron deficiency anemia 11/21/2012    At time of presentation.  Feraheme 1020 mg on 11/21/12.  . Follicular lymphoma ([place]) 11/20/2012    Right ureteral obstruction by lymphadenopathy-> right hydronephrosis  . Port catheter in place 06/06/2013  . Anemia of chronic renal failure, stage 4 (severe) ([place]) 99991111    has Follicular lymphoma ([place]); Iron deficiency anemia; Chronic kidney disease (CKD), stage IV (severe) ([place]); Diabetes mellitus type 2 in obese ([place]); Hypertension; UTI (urinary tract infection); Leukocytosis, unspecified; Hypoglycemia; Port catheter in place; CKD (chronic kidney disease); Anemia of chronic renal failure, stage 4 (severe) ([place]); and Dysuria on her problem list.     is allergic to azithromycin.  Current Outpatient Prescriptions  on File Prior to Visit  Medication Sig Dispense Refill  . ACCU-CHEK AVIVA  PLUS test strip     . acetaminophen (TYLENOL) 325 MG tablet Take 650 mg by mouth every 6 (six) hours as needed for pain.    Marland Kitchen atorvastatin (LIPITOR) 80 MG tablet Take 80 mg by mouth every morning.    . BD PEN NEEDLE NANO U/F 32G X 4 MM MISC TEST BLOOD GLUCOSE ONCE DAILY  0  . furosemide (LASIX) 20 MG tablet Take 20 mg by mouth daily. One tablet 3 times a week    . glipiZIDE (GLUCOTROL XL) 5 MG 24 hr tablet     . Insulin Glargine 300 UNIT/ML SOPN Inject 80 Units into the skin daily.    Marland Kitchen lidocaine-prilocaine (EMLA) cream Apply 1 application topically as needed. 1 hour prior to Chemo 30 g prn  . losartan (COZAAR) 100 MG tablet Take 100 mg by mouth daily.    . Multiple Vitamin (MULTIVITAMIN WITH MINERALS) TABS Take 1 tablet by mouth daily.     No current facility-administered medications on file prior to visit.    SURGICAL HISTORY: Past Surgical History  Procedure Laterality Date  . Cataract extraction    . Cystoscopy w/ ureteral stent placement Right 11/03/2012    Procedure: CYSTOSCOPY WITH RETROGRADE PYELOGRAM/URETERAL STENT PLACEMENT;  Surgeon: Malka So, MD;  Location: [facility];  Service: Urology;  Laterality: Right;  . Portacath placement Left 11/03/2012    Procedure: INSERTION PORT-A-CATH;  Surgeon: Scherry Ran, MD;  Location: [facility];  Service: General;  Laterality: Left;  Insertion Port-A-Cath Left Subclavian  . Appendectomy    . Cystoscopy w/ ureteral stent placement Right 03/23/2013    Procedure: CYSTOSCOPY WITH STENT REPLACEMENT;  Surgeon: Malka So, MD;  Location: [facility];  Service: Urology;  Laterality: Right;    SOCIAL HISTORY: Social History   Social History  . Marital Status: Married    Spouse Name: N/A  . Number of Children: N/A  . Years of Education: N/A   Occupational History  . Not on file.   Social History Main Topics  . Smoking status: Never Smoker   . Smokeless tobacco: Never Used  . Alcohol Use: No  . Drug Use: No  . Sexual Activity: No   Other  Topics Concern  . Not on file   Social History Narrative    FAMILY HISTORY: Family History  Problem Relation Age of Onset  . Cancer Father   . Cancer Brother     Review of Systems  Constitutional:Negative for fever, chills and weight loss.  HENT: Negative for congestion, hearing loss, nosebleeds, sore throat and tinnitus.   Eyes: Negative for blurred vision, double vision, pain and discharge.  Respiratory: Negative for cough, hemoptysis, sputum production, shortness of breath and wheezing.   Cardiovascular: Negative for chest pain, palpitations, claudication, leg swelling and PND.  Gastrointestinal: Negative for heartburn, vomiting, abdominal pain, constipation, blood in stool and melena. Rare loose stool and nausea Genitourinary: Negative for hematuria.  Musculoskeletal: Negative for myalgias and falls. Positive for back pain Skin: Negative for itching and rash. Positive for lesion on r cheek Neurological: Negative for dizziness, tingling, tremors, sensory change, speech change, focal weakness, seizures, loss of consciousness, weakness and headaches.  Endo/Heme/Allergies: Does not bruise/bleed easily.  Psychiatric/Behavioral: Negative for depression, suicidal ideas, memory loss and substance abuse. The patient is not nervous/anxious and does not have insomnia.   14 point review of systems was performed and is negative except as  detailed under history of present illness and above   PHYSICAL EXAMINATION  ECOG PERFORMANCE STATUS: 1 - Symptomatic but completely ambulatory  Filed Vitals:   01/07/16 1030  BP: 159/52  Pulse: 86  Temp: 98.6 F (37 C)  Resp: 18    Physical Exam  Constitutional: She is oriented to person, place, and time and well-developed, well-nourished, and in no distress.  Obese. Ambulates with cane.  HENT:  Head: Normocephalic and atraumatic.  Nose: Nose normal.  Mouth/Throat: Oropharynx is clear and moist. No oropharyngeal exudate.  Eyes: Conjunctivae  and EOM are normal. Pupils are equal, round, and reactive to light. Right eye exhibits no discharge. Left eye exhibits no discharge. No scleral icterus.  Neck: Normal range of motion. Neck supple. No tracheal deviation present. No thyromegaly present.  Cardiovascular: Normal rate and regular rhythm.  Exam reveals no gallop and no friction rub.   Murmur heard. Pulmonary/Chest: Effort normal and breath sounds normal. She has no wheezes. She has no rales.  Abdominal: Soft. Bowel sounds are normal. She exhibits no distension and no mass. There is no tenderness. There is no rebound and no guarding.  Musculoskeletal: Normal range of motion. Chronic LE skin changes. Few superficial ulcerations on LE. Lymphadenopathy:    She has no cervical adenopathy.  Neurological: She is alert and oriented to person, place, and time. She has normal reflexes. No cranial nerve deficit. Gait normal. Coordination normal.  Skin: Skin is warm and dry. No rash noted.  Psychiatric: Mood, memory, affect and judgment normal.  Nursing note and vitals reviewed.   LABORATORY DATA: I have reviewed the data as listed  CBC    Component Value Date/Time   WBC 5.6 01/01/2016 1253   RBC 3.40* 01/01/2016 1253   RBC 3.17* 07/26/2014 0956   HGB 10.4* 01/01/2016 1253   HCT 32.9* 01/01/2016 1253   PLT 243 01/01/2016 1253   MCV 96.8 01/01/2016 1253   MCH 30.6 01/01/2016 1253   MCHC 31.6 01/01/2016 1253   RDW 16.7* 01/01/2016 1253   LYMPHSABS 1.0 01/01/2016 1253   MONOABS 0.4 01/01/2016 1253   EOSABS 0.3 01/01/2016 1253   BASOSABS 0.0 01/01/2016 1253   CMP     Component Value Date/Time   NA 137 01/01/2016 1253   K 4.3 01/01/2016 1253   CL 104 01/01/2016 1253   CO2 26 01/01/2016 1253   GLUCOSE 242* 01/01/2016 1253   BUN 45* 01/01/2016 1253   CREATININE 2.32* 01/01/2016 1253   CALCIUM 9.3 01/01/2016 1253   PROT 6.7 01/01/2016 1253   ALBUMIN 3.6 01/01/2016 1253   AST 25 01/01/2016 1253   ALT 17 01/01/2016 1253    ALKPHOS 82 01/01/2016 1253   BILITOT 0.5 01/01/2016 1253   GFRNONAA 20* 01/01/2016 1253   GFRAA 23* 01/01/2016 1253   RADIOLOGY: I have reviewed the above documentation for accuracy and completeness, and I agree with the above. ADDENDUM REPORT: 01/01/2016 07:59 ADDENDUM: Pathology revealed FIBROCYSTIC CHANGES WITH FOCAL USUAL DUCTAL HYPERPLASIA AND ASSOCIATED CALCIFICATION, BENIGN DUCTS WITH ASSOCIATED CALCIFICATION of the Left lateral breast. This was found to be concordant by Dr. Ammie Ferrier. Pathology results were discussed with the patient by telephone. The patient reported doing well after the biopsy with tenderness at the site. Post biopsy instructions and care were reviewed and questions were answered. The patient was encouraged to call [facility] for any additional concerns. Surgical consult and MRI of the breast is suggested given patient's complains of spontaneous left nipple discharge  and mammographically seen anterior left skin thickening. Dr. Sharin Mons was notified by email with this information. Pathology results reported by Terie Purser, RN on 01/01/2016. Electronically Signed  By: Ammie Ferrier M.D.  On: 01/01/2016 07:59 Study Result     CLINICAL DATA: Post biopsy mammogram of the left breast for clip placement.  EXAM: 3D DIAGNOSTIC LEFT MAMMOGRAM POST STEREOTACTIC BIOPSY  COMPARISON: Previous exam(s).  FINDINGS: 3D Mammographic images were obtained following stereotactic guided biopsy of left breast asymmetry. The coil shaped biopsy marking clip is appropriately positioned at the site of the asymmetry in the slightly lateral, posterior left breast.  IMPRESSION: Appropriate positioning of the coil shaped biopsy marking clip in the slightly lateral, posterior left breast.  Final Assessment: Post Procedure Mammograms for Marker Placement   Electronically Signed  By: Ammie Ferrier M.D.  On:  12/30/2015 11:25     ASSESSMENT and THERAPY PLAN:   Follicular lymphoma, Stage II History of R ureteral obstruction secondary to above Dysuria, urinary frequency, chronic CKD, stage IV Anemia secondary to CKD Skin lesion L breast biopsy 01/01/2015 L nipple discharge  75 year old female with follicular lymphoma, she was treated with Treanda and Rituxan. She completed Rituxan maintenance. No obvious recurrence.  Anemia is stable. She will continue on renal dosing of aranesp. Will continue to follow ferritin levels q90 days. Goal is to maintain ferritin greater than 100.  Recent mammogram and biopsy results were reviewed. Unfortunately given the patients CKD breast MRI would not be able to be performed. She has been referred to Dr. Arnoldo Morale.    I will see her back in 3 months, and if she's doing well, we will move her out to 4 months.   We will keep following her standing labs as ordered. Continue aranesp for anemia of chronic kidney disease.  All questions were answered. The patient knows to call the clinic with any problems, questions or concerns. We can certainly see the patient much sooner if necessary.  This document serves as a record of services personally performed by Ancil Linsey, MD. It was created on her behalf by Arlyce Harman, a trained medical scribe. The creation of this record is based on the scribe's personal observations and the provider's statements to them. This document has been checked and approved by the attending provider.  I have reviewed the above documentation for accuracy and completeness, and I agree with the above.  [place] Fam. Jasline Buskirk MD

## 2016-01-15 ENCOUNTER — Encounter (HOSPITAL_COMMUNITY): Payer: Medicare Other

## 2016-01-15 ENCOUNTER — Encounter (HOSPITAL_COMMUNITY): Payer: Medicare Other | Attending: Oncology

## 2016-01-15 VITALS — BP 173/59 | HR 71 | Temp 97.6°F | Resp 16

## 2016-01-15 DIAGNOSIS — D631 Anemia in chronic kidney disease: Secondary | ICD-10-CM

## 2016-01-15 DIAGNOSIS — N184 Chronic kidney disease, stage 4 (severe): Secondary | ICD-10-CM | POA: Diagnosis present

## 2016-01-15 DIAGNOSIS — C8290 Follicular lymphoma, unspecified, unspecified site: Secondary | ICD-10-CM | POA: Diagnosis present

## 2016-01-15 DIAGNOSIS — R300 Dysuria: Secondary | ICD-10-CM | POA: Diagnosis not present

## 2016-01-15 LAB — CBC
HCT: 32.9 % — ABNORMAL LOW (ref 36.0–46.0)
Hemoglobin: 10.1 g/dL — ABNORMAL LOW (ref 12.0–15.0)
MCH: 30.1 pg (ref 26.0–34.0)
MCHC: 30.7 g/dL (ref 30.0–36.0)
MCV: 97.9 fL (ref 78.0–100.0)
PLATELETS: 213 10*3/uL (ref 150–400)
RBC: 3.36 MIL/uL — AB (ref 3.87–5.11)
RDW: 16.7 % — ABNORMAL HIGH (ref 11.5–15.5)
WBC: 5.1 10*3/uL (ref 4.0–10.5)

## 2016-01-15 MED ORDER — DARBEPOETIN ALFA 150 MCG/0.3ML IJ SOSY
PREFILLED_SYRINGE | INTRAMUSCULAR | Status: AC
  Filled 2016-01-15: qty 0.3

## 2016-01-15 MED ORDER — DARBEPOETIN ALFA 150 MCG/0.3ML IJ SOSY: 125.0000 ug | PREFILLED_SYRINGE | Freq: Once | INTRAMUSCULAR | Status: DC

## 2016-01-15 NOTE — Patient Instructions (Signed)
[place] at [facility] Discharge Instructions  RECOMMENDATIONS MADE BY THE CONSULTANT AND ANY TEST RESULTS WILL BE SENT TO YOUR REFERRING PHYSICIAN.  Aranesp not given today per orders due to your Hypertension. Will see you as scheduled. Call for any concerns or questions  Thank you for choosing [place] at [facility] to provide your oncology and hematology care.  To afford each patient quality time with our provider, please arrive at least 15 minutes before your scheduled appointment time.   Beginning January 23rd 2017 lab work for the [facility] will be done in the  Main lab at [facility] on 1st floor. If you have a lab appointment with the [facility] please come in thru the  Main Entrance and check in at the main information desk  You need to re-schedule your appointment should you arrive 10 or more minutes late.  We strive to give you quality time with our providers, and arriving late affects you and other patients whose appointments are after yours.  Also, if you no show three or more times for appointments you may be dismissed from the clinic at the providers discretion.     Again, thank you for choosing [facility].  Our hope is that these requests will decrease the amount of time that you wait before being seen by our physicians.       _____________________________________________________________  Should you have questions after your visit to [facility], please contact our office at [phone] between the hours of 8:30 a.m. and 4:30 p.m.  Voicemails left after 4:30 p.m. will not be returned until the following business day.  For prescription refill requests, have your pharmacy contact our office.         Resources For Cancer Patients and their Caregivers ? American Cancer Society: Can assist with transportation, wigs, general needs, runs Look Good Feel Better.        405-564-6241 ? Cancer  Care: Provides financial assistance, online support groups, medication/co-pay assistance.  1-800-813-HOPE 873-596-4934) ? [facility] Assists [place] cancer patients and their families through emotional , educational and financial support.  920 532 1981 ? [place] DSS Where to apply for food stamps, Medicaid and utility assistance. 9704421407 ? [facility]: Transportation to medical appointments. 928-428-2508 ? Social Security Administration: May apply for disability if have a Stage IV cancer. (647)867-6856 (414)450-0464 ? [employer], Disability and Transit Services: Assists with nutrition, care and transit needs. [place] Support Programs: @10RELATIVEDAYS @ > Cancer Support Group  2nd Tuesday of the month 1pm-2pm, Journey Room  > Creative Journey  3rd Tuesday of the month 1130am-1pm, Journey Room  > Look Good Feel Better  1st Wednesday of the month 10am-12 noon, Journey Room (Call [place] to register [phone])

## 2016-01-15 NOTE — Progress Notes (Signed)
Patient presented today for Aranesp injection. Elevated BP, look at flowsheet for reference. Held Aranesp per Dr.Penland, referred back to primary, Made patinet an appointment today for hypertendion. Printed off a list of vitals to send with patient to PCP.

## 2016-01-24 ENCOUNTER — Encounter (HOSPITAL_COMMUNITY): Payer: Self-pay

## 2016-01-24 ENCOUNTER — Observation Stay (HOSPITAL_COMMUNITY)
Admission: EM | Admit: 2016-01-24 | Discharge: 2016-01-26 | Disposition: A | Discharge status: Home / Self Care | Payer: Medicare Other | Attending: Internal Medicine | Admitting: Internal Medicine

## 2016-01-24 ENCOUNTER — Emergency Department (HOSPITAL_COMMUNITY): Payer: Medicare Other

## 2016-01-24 DIAGNOSIS — R609 Edema, unspecified: Secondary | ICD-10-CM | POA: Insufficient documentation

## 2016-01-24 DIAGNOSIS — J9801 Acute bronchospasm: Secondary | ICD-10-CM | POA: Diagnosis not present

## 2016-01-24 DIAGNOSIS — L03115 Cellulitis of right lower limb: Secondary | ICD-10-CM | POA: Insufficient documentation

## 2016-01-24 DIAGNOSIS — E119 Type 2 diabetes mellitus without complications: Secondary | ICD-10-CM | POA: Diagnosis not present

## 2016-01-24 DIAGNOSIS — E785 Hyperlipidemia, unspecified: Secondary | ICD-10-CM | POA: Diagnosis not present

## 2016-01-24 DIAGNOSIS — L03116 Cellulitis of left lower limb: Secondary | ICD-10-CM | POA: Insufficient documentation

## 2016-01-24 DIAGNOSIS — I10 Essential (primary) hypertension: Secondary | ICD-10-CM | POA: Diagnosis not present

## 2016-01-24 DIAGNOSIS — N189 Chronic kidney disease, unspecified: Secondary | ICD-10-CM

## 2016-01-24 DIAGNOSIS — L03119 Cellulitis of unspecified part of limb: Secondary | ICD-10-CM

## 2016-01-24 DIAGNOSIS — E669 Obesity, unspecified: Secondary | ICD-10-CM

## 2016-01-24 DIAGNOSIS — R197 Diarrhea, unspecified: Secondary | ICD-10-CM

## 2016-01-24 DIAGNOSIS — C8236 Follicular lymphoma grade IIIa, intrapelvic lymph nodes: Secondary | ICD-10-CM | POA: Diagnosis present

## 2016-01-24 DIAGNOSIS — Z7984 Long term (current) use of oral hypoglycemic drugs: Secondary | ICD-10-CM | POA: Insufficient documentation

## 2016-01-24 DIAGNOSIS — R600 Localized edema: Secondary | ICD-10-CM

## 2016-01-24 DIAGNOSIS — E1169 Type 2 diabetes mellitus with other specified complication: Secondary | ICD-10-CM

## 2016-01-24 DIAGNOSIS — Z79899 Other long term (current) drug therapy: Secondary | ICD-10-CM | POA: Diagnosis not present

## 2016-01-24 DIAGNOSIS — N179 Acute kidney failure, unspecified: Secondary | ICD-10-CM | POA: Diagnosis not present

## 2016-01-24 DIAGNOSIS — E860 Dehydration: Secondary | ICD-10-CM | POA: Diagnosis not present

## 2016-01-24 DIAGNOSIS — N184 Chronic kidney disease, stage 4 (severe): Secondary | ICD-10-CM | POA: Diagnosis present

## 2016-01-24 DIAGNOSIS — R0602 Shortness of breath: Secondary | ICD-10-CM | POA: Diagnosis not present

## 2016-01-24 DIAGNOSIS — L0390 Cellulitis, unspecified: Secondary | ICD-10-CM | POA: Diagnosis present

## 2016-01-24 DIAGNOSIS — Z794 Long term (current) use of insulin: Secondary | ICD-10-CM | POA: Insufficient documentation

## 2016-01-24 DIAGNOSIS — R531 Weakness: Secondary | ICD-10-CM | POA: Diagnosis present

## 2016-01-24 LAB — BASIC METABOLIC PANEL
ANION GAP: 8 (ref 5–15)
BUN: 49 mg/dL — AB (ref 6–20)
CALCIUM: 8.6 mg/dL — AB (ref 8.9–10.3)
CO2: 22 mmol/L (ref 22–32)
Chloride: 105 mmol/L (ref 101–111)
Creatinine, Ser: 3.09 mg/dL — ABNORMAL HIGH (ref 0.44–1.00)
GFR calc Af Amer: 16 mL/min — ABNORMAL LOW (ref >60)
GFR, EST NON AFRICAN AMERICAN: 14 mL/min — AB (ref >60)
GLUCOSE: 150 mg/dL — AB (ref 65–99)
Potassium: 4.7 mmol/L (ref 3.5–5.1)
SODIUM: 135 mmol/L (ref 135–145)

## 2016-01-24 LAB — URINE MICROSCOPIC-ADD ON

## 2016-01-24 LAB — CBC WITH DIFFERENTIAL/PLATELET
BASOS ABS: 0 10*3/uL (ref 0.0–0.1)
Basophils Relative: 0 %
EOS ABS: 0.3 10*3/uL (ref 0.0–0.7)
EOS PCT: 4 %
HCT: 31.7 % — ABNORMAL LOW (ref 36.0–46.0)
Hemoglobin: 10.1 g/dL — ABNORMAL LOW (ref 12.0–15.0)
LYMPHS PCT: 15 %
Lymphs Abs: 0.9 10*3/uL (ref 0.7–4.0)
MCH: 30 pg (ref 26.0–34.0)
MCHC: 31.9 g/dL (ref 30.0–36.0)
MCV: 94.1 fL (ref 78.0–100.0)
MONO ABS: 0.4 10*3/uL (ref 0.1–1.0)
Monocytes Relative: 8 %
Neutro Abs: 4.2 10*3/uL (ref 1.7–7.7)
Neutrophils Relative %: 73 %
PLATELETS: 187 10*3/uL (ref 150–400)
RBC: 3.37 MIL/uL — ABNORMAL LOW (ref 3.87–5.11)
RDW: 17 % — AB (ref 11.5–15.5)
WBC: 5.9 10*3/uL (ref 4.0–10.5)

## 2016-01-24 LAB — URINALYSIS, ROUTINE W REFLEX MICROSCOPIC
BILIRUBIN URINE: NEGATIVE
Glucose, UA: NEGATIVE mg/dL
KETONES UR: NEGATIVE mg/dL
Leukocytes, UA: NEGATIVE
Nitrite: NEGATIVE
Specific Gravity, Urine: 1.01 (ref 1.005–1.030)
pH: 6 (ref 5.0–8.0)

## 2016-01-24 LAB — I-STAT CG4 LACTIC ACID, ED: Lactic Acid, Venous: 1.55 mmol/L (ref 0.5–1.9)

## 2016-01-24 LAB — BRAIN NATRIURETIC PEPTIDE: B NATRIURETIC PEPTIDE 5: 63 pg/mL (ref 0.0–100.0)

## 2016-01-24 MED ORDER — SODIUM CHLORIDE 0.9 % IV BOLUS (SEPSIS)
1000.0000 mL | Freq: Once | INTRAVENOUS | Status: AC
  Administered 2016-01-24: 1000 mL via INTRAVENOUS

## 2016-01-24 MED ORDER — SODIUM CHLORIDE 0.9 % IV BOLUS (SEPSIS): 500.0000 mL | Freq: Once | INTRAVENOUS | Status: DC

## 2016-01-24 MED ORDER — ONDANSETRON HCL 4 MG/2ML IJ SOLN
4.0000 mg | Freq: Once | INTRAMUSCULAR | Status: AC
Start: 2016-01-24 — End: 2016-01-24
  Administered 2016-01-24: 4 mg via INTRAVENOUS
  Filled 2016-01-24: qty 2

## 2016-01-24 NOTE — ED Provider Notes (Signed)
CSN: [id number]     Arrival date & time 01/24/16  2121 History   By signing my name below, I, Maud Deed. Royston Sinner, attest that this documentation has been prepared under the direction and in the presence of Orpah Greek, MD.  Electronically Signed: Maud Deed. Royston Sinner, ED Scribe. 01/24/2016. 11:28 PM.   Chief Complaint  Patient presents with  . Weakness   The history is provided by the patient. No language interpreter was used.    HPI Comments: [name] [name] is a 75 y.o. female with a PMHx of HTN, DM, anemia, and hyperlipidemia who presents to the Emergency Department complaining of constant, unchanged generalized weakness x 2-3 days. She also reports ongoing diarrhea that is dark in color, worsening cough, chills, along with worsening redness and swelling to BLE. Pt states she was seen at her PCP office 2 days ago for reevaluation of high blood pressure. Pt was started on Bactrim at time of visit. In addition, pt states she was started on a new blood pressure medication approximately 1 week ago. However, she feels new blood pressure medication is contributing to her diarrhea. No recent fever, chills, nausea, or vomiting. She is not currently undergoing any chemotherapy.  PCP: [name], MD    Past Medical History  Diagnosis Date  . Hypertension   . Diabetes mellitus, type 2 ([place])   . Hyperlipidemia   . Iron deficiency anemia 11/21/2012    At time of presentation.  Feraheme 1020 mg on 11/21/12.  . 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 Port catheter in place 06/06/2013  . Anemia of chronic renal failure, stage 4 (severe) ([place]) 07/30/2014   Past Surgical History  Procedure Laterality Date  . Cataract extraction    . Cystoscopy w/ ureteral stent placement Right 11/03/2012    Procedure: CYSTOSCOPY WITH RETROGRADE PYELOGRAM/URETERAL STENT PLACEMENT;  Surgeon: Malka So, MD;  Location: [facility];  Service:  Urology;  Laterality: Right;  . Portacath placement Left 11/03/2012    Procedure: INSERTION PORT-A-CATH;  Surgeon: Scherry Ran, MD;  Location: [facility];  Service: General;  Laterality: Left;  Insertion Port-A-Cath Left Subclavian  . Appendectomy    . Cystoscopy w/ ureteral stent placement Right 03/23/2013    Procedure: CYSTOSCOPY WITH STENT REPLACEMENT;  Surgeon: Malka So, MD;  Location: [facility];  Service: Urology;  Laterality: Right;   Family History  Problem Relation Age of Onset  . Cancer Father   . Cancer Brother    Social History  Substance Use Topics  . Smoking status: Never Smoker   . Smokeless tobacco: Never Used  . Alcohol Use: No   OB History    No data available     Review of Systems  Constitutional: Positive for chills. Negative for fever.  Respiratory: Positive for cough.   Cardiovascular: Positive for leg swelling.  Gastrointestinal: Positive for diarrhea.  All other systems reviewed and are negative.     Allergies  Azithromycin  Home Medications   Prior to Admission medications   Medication Sig Start Date End Date Taking? Authorizing Provider  ACCU-CHEK AVIVA PLUS test strip  11/20/14   Historical Provider, MD  acetaminophen (TYLENOL) 325 MG tablet Take 650 mg by mouth every 6 (six) hours as needed for pain.    Historical Provider, MD  atorvastatin (LIPITOR) 80 MG tablet Take 80 mg by mouth every morning.    Historical Provider, MD  BD PEN NEEDLE NANO U/F  32G X 4 MM MISC TEST BLOOD GLUCOSE ONCE DAILY 04/14/15   Historical Provider, MD  furosemide (LASIX) 20 MG tablet Take 20 mg by mouth daily. One tablet 3 times a week    Historical Provider, MD  glipiZIDE (GLUCOTROL XL) 5 MG 24 hr tablet  05/18/15   Historical Provider, MD  Insulin Glargine 300 UNIT/ML SOPN Inject 80 Units into the skin daily. 08/13/15 08/12/16  Historical Provider, MD  lidocaine-prilocaine (EMLA) cream Apply 1 application topically as needed. 1 hour prior to [facility] 11/22/14 11/22/15  Patrici Ranks, MD  losartan (COZAAR) 100 MG tablet Take 100 mg by mouth daily. 08/21/14   Historical Provider, MD  Multiple Vitamin (MULTIVITAMIN WITH MINERALS) TABS Take 1 tablet by mouth daily.    Historical Provider, MD   Triage Vitals: BP 152/53 mmHg  Pulse 79  Temp(Src) 98.8 F (37.1 C) (Oral)  Resp 20  Ht 5\' 3"  (1.6 m)  Wt 224 lb (101.606 kg)  BMI 39.69 kg/m2  SpO2 95%   Physical Exam  Constitutional: She is oriented to person, place, and time. She appears well-developed and well-nourished. No distress.  HENT:  Head: Normocephalic and atraumatic.  Right Ear: Hearing normal.  Left Ear: Hearing normal.  Nose: Nose normal.  Mouth/Throat: Mucous membranes are normal.  Dry mucous membranes  Eyes: Conjunctivae and EOM are normal. Pupils are equal, round, and reactive to light.  Neck: Normal range of motion. Neck supple.  Cardiovascular: Regular rhythm, S1 normal and S2 normal.  Exam reveals no gallop and no friction rub.   No murmur heard. Pulmonary/Chest: Effort normal. No respiratory distress. She has wheezes. She exhibits no tenderness.  Wheezes bilaterally with scattered rhonchi  Abdominal: Soft. Normal appearance and bowel sounds are normal. There is no hepatosplenomegaly. There is no tenderness. There is no rebound, no guarding, no tenderness at McBurney's point and negative Murphy's sign. No hernia.  Musculoskeletal: Normal range of motion. She exhibits edema.  Bilateral erythema and warmth mid shin to ankle with 1+ pitting edema.  Neurological: She is alert and oriented to person, place, and time. She has normal strength. No cranial nerve deficit or sensory deficit. Coordination normal. GCS eye subscore is 4. GCS verbal subscore is 5. GCS motor subscore is 6.  Skin: Skin is warm, dry and intact. No rash noted. No cyanosis.  Psychiatric: She has a normal mood and affect. Her speech is normal and behavior is normal. Thought content normal.  Nursing note and vitals  reviewed.   ED Course  Procedures (including critical care time)  DIAGNOSTIC STUDIES: Oxygen Saturation is 95% on RA, adequate by my interpretation.    COORDINATION OF CARE: 11:27 PM- Will order CXR, blood work, and urinalysis. Discussed treatment plan with pt at bedside and pt agreed to plan.     Labs Review Labs Reviewed  CBC WITH DIFFERENTIAL/PLATELET - Abnormal; Notable for the following:    RBC 3.37 (*)    Hemoglobin 10.1 (*)    HCT 31.7 (*)    RDW 17.0 (*)    All other components within normal limits  BASIC METABOLIC PANEL - Abnormal; Notable for the following:    Glucose, Bld 150 (*)    BUN 49 (*)    Creatinine, Ser 3.09 (*)    Calcium 8.6 (*)    GFR calc non Af Amer 14 (*)    GFR calc Af Amer 16 (*)    All other components within normal limits  URINALYSIS, ROUTINE W REFLEX MICROSCOPIC (NOT  AT [facility]) - Abnormal; Notable for the following:    Hgb urine dipstick TRACE (*)    Protein, ur TRACE (*)    All other components within normal limits  URINE MICROSCOPIC-ADD ON - Abnormal; Notable for the following:    Squamous Epithelial / LPF 6-30 (*)    Bacteria, UA RARE (*)    All other components within normal limits  C DIFFICILE QUICK SCREEN W PCR REFLEX  BRAIN NATRIURETIC PEPTIDE  HEPATIC FUNCTION PANEL  I-STAT CG4 LACTIC ACID, ED    Imaging Review Dg Chest 2 View  01/25/2016  CLINICAL DATA:  75 year old female with generalized weakness and cough and shortness of breath EXAM: CHEST  2 VIEW COMPARISON:  CT dated 05/08/2013 FINDINGS: Two views of the chest do not demonstrate focal consolidation. There is no pleural effusion or pneumothorax. The cardiac silhouette is within normal limits. Left pectoral Port-A-Cath with tip over the central SVC. No acute fracture. Multiple surgical clips noted at the base of the neck on the right. IMPRESSION: No active cardiopulmonary disease. Electronically Signed   By: Anner Crete M.D.   On: 01/25/2016 00:08   I have personally  reviewed and evaluated these images and lab results as part of my medical decision-making.   EKG Interpretation   Date/Time:  Sunday January 25 2016 01:36:18 EDT Ventricular Rate:  76 PR Interval:    QRS Duration: 90 QT Interval:  398 QTC Calculation: 448 R Axis:   39 Text Interpretation:  Sinus rhythm Normal ECG Confirmed by Mirinda Monte  MD,  Leiliana Foody [id number]) on 01/25/2016 1:46:23 AM      MDM   Final diagnoses:  Diarrhea, unspecified type  Dehydration  AKI (acute kidney injury) ([place])  Peripheral edema  Cellulitis of lower extremity, unspecified laterality  Bronchospasm    Patient presents to the emergency department for evaluation of generalized weakness. Patient is a very poor historian. She reports seeing her doctor recently being put on a medicine that caused her to have diarrhea. She is not sure what the medicine was what it was for. She then was seen in oncology this week and noted to have elevated blood pressures. Oncology referred her back to her primary doctor for recheck of blood pressure. She was seen and started on Bactrim. She doesn't know why she was started on Bactrim, but she does have warmth and redness of both lower extremities and it is presumed that the Bactrim was for possible cellulitis. Difficult to differentiate bilateral lower extremity cellulitis from stasis dermatitis, but patient reports a significant acute change in the redness and warmth of her legs in the last few days, as well as increased swelling. She does not appear septic.  She reports significantly decreased oral intake of solids and liquids with the persistent diarrhea. She appears dehydrated on examination. She has a baseline renal insufficiency but creatinine is increased consistent with acute kidney injury, likely secondary to dehydration.  Patient also reports cough and chest congestion over the last several days. Chest x-ray does not show any evidence of pneumonia. Examination upon arrival,  however, did reveal bronchospasm. She does not report a history of asthma or COPD. Oxygen saturations are 92-94%, adequate but slightly decreased. Will treat with bronchodilator therapy.  She has multiple medical problems that are acute and will require inpatient treatment.  I personally performed the services described in this documentation, which was scribed in my presence. The recorded information has been reviewed and is accurate.   Orpah Greek, MD 01/25/16 [phone]

## 2016-01-24 NOTE — ED Notes (Signed)
I feel cold, my ankles have turned red, and I feel sick.  I have not eaten much today and I am nauseated.

## 2016-01-25 ENCOUNTER — Encounter (HOSPITAL_COMMUNITY): Payer: Self-pay | Admitting: Hematology & Oncology

## 2016-01-25 DIAGNOSIS — R600 Localized edema: Secondary | ICD-10-CM | POA: Diagnosis not present

## 2016-01-25 DIAGNOSIS — E860 Dehydration: Secondary | ICD-10-CM

## 2016-01-25 DIAGNOSIS — L0390 Cellulitis, unspecified: Secondary | ICD-10-CM | POA: Diagnosis present

## 2016-01-25 DIAGNOSIS — N179 Acute kidney failure, unspecified: Secondary | ICD-10-CM | POA: Insufficient documentation

## 2016-01-25 DIAGNOSIS — E119 Type 2 diabetes mellitus without complications: Secondary | ICD-10-CM

## 2016-01-25 DIAGNOSIS — E669 Obesity, unspecified: Secondary | ICD-10-CM

## 2016-01-25 DIAGNOSIS — N189 Chronic kidney disease, unspecified: Secondary | ICD-10-CM | POA: Diagnosis present

## 2016-01-25 DIAGNOSIS — R197 Diarrhea, unspecified: Secondary | ICD-10-CM | POA: Diagnosis not present

## 2016-01-25 LAB — CBC
HEMATOCRIT: 28.1 % — AB (ref 36.0–46.0)
HEMOGLOBIN: 8.9 g/dL — AB (ref 12.0–15.0)
MCH: 29.9 pg (ref 26.0–34.0)
MCHC: 31.7 g/dL (ref 30.0–36.0)
MCV: 94.3 fL (ref 78.0–100.0)
Platelets: 179 10*3/uL (ref 150–400)
RBC: 2.98 MIL/uL — ABNORMAL LOW (ref 3.87–5.11)
RDW: 17.1 % — AB (ref 11.5–15.5)
WBC: 5.1 10*3/uL (ref 4.0–10.5)

## 2016-01-25 LAB — HEPATIC FUNCTION PANEL
ALT: 14 U/L (ref 14–54)
AST: 27 U/L (ref 15–41)
Albumin: 3.1 g/dL — ABNORMAL LOW (ref 3.5–5.0)
Alkaline Phosphatase: 69 U/L (ref 38–126)
Bilirubin, Direct: 0.1 mg/dL (ref 0.1–0.5)
Indirect Bilirubin: 0.2 mg/dL — ABNORMAL LOW (ref 0.3–0.9)
Total Bilirubin: 0.3 mg/dL (ref 0.3–1.2)
Total Protein: 5.8 g/dL — ABNORMAL LOW (ref 6.5–8.1)

## 2016-01-25 LAB — GLUCOSE, CAPILLARY
GLUCOSE-CAPILLARY: 94 mg/dL (ref 65–99)
GLUCOSE-CAPILLARY: 127 mg/dL — AB (ref 65–99)
GLUCOSE-CAPILLARY: 152 mg/dL — AB (ref 65–99)
Glucose-Capillary: 79 mg/dL (ref 65–99)

## 2016-01-25 LAB — BASIC METABOLIC PANEL
ANION GAP: 11 (ref 5–15)
BUN: 45 mg/dL — AB (ref 6–20)
CO2: 18 mmol/L — AB (ref 22–32)
Calcium: 8 mg/dL — ABNORMAL LOW (ref 8.9–10.3)
Chloride: 108 mmol/L (ref 101–111)
Creatinine, Ser: 2.94 mg/dL — ABNORMAL HIGH (ref 0.44–1.00)
GFR calc Af Amer: 17 mL/min — ABNORMAL LOW (ref >60)
GFR, EST NON AFRICAN AMERICAN: 15 mL/min — AB (ref >60)
GLUCOSE: 135 mg/dL — AB (ref 65–99)
POTASSIUM: 4.4 mmol/L (ref 3.5–5.1)
Sodium: 137 mmol/L (ref 135–145)

## 2016-01-25 MED ORDER — INSULIN ASPART 100 UNIT/ML ~~LOC~~ SOLN: 0.0000 [IU] | Freq: Three times a day (TID) | SUBCUTANEOUS | Status: DC

## 2016-01-25 MED ORDER — DOXYCYCLINE HYCLATE 100 MG PO TABS
100.0000 mg | ORAL_TABLET | Freq: Two times a day (BID) | ORAL | Status: DC
  Administered 2016-01-25 – 2016-01-26 (×3): 100 mg via ORAL
  Filled 2016-01-25 (×3): qty 1

## 2016-01-25 MED ORDER — ALBUTEROL SULFATE (2.5 MG/3ML) 0.083% IN NEBU
INHALATION_SOLUTION | RESPIRATORY_TRACT | Status: AC
Start: 2016-01-25 — End: 2016-01-25
  Administered 2016-01-25: 2.5 mg
  Filled 2016-01-25: qty 3

## 2016-01-25 MED ORDER — ALBUTEROL SULFATE (2.5 MG/3ML) 0.083% IN NEBU: 2.5000 mg | INHALATION_SOLUTION | RESPIRATORY_TRACT | Status: DC | PRN

## 2016-01-25 MED ORDER — SODIUM CHLORIDE 0.9 % IV SOLN
INTRAVENOUS | Status: AC
  Administered 2016-01-25: 04:00:00 via INTRAVENOUS

## 2016-01-25 MED ORDER — DEXTROSE 5 % IV SOLN
1.0000 g | INTRAVENOUS | Status: DC
  Administered 2016-01-25: 1 g via INTRAVENOUS
  Filled 2016-01-25 (×2): qty 10

## 2016-01-25 MED ORDER — ALBUTEROL SULFATE (2.5 MG/3ML) 0.083% IN NEBU
2.5000 mg | INHALATION_SOLUTION | Freq: Once | RESPIRATORY_TRACT | Status: AC
  Administered 2016-01-25: 2.5 mg via RESPIRATORY_TRACT
  Filled 2016-01-25: qty 3

## 2016-01-25 MED ORDER — DEXTROSE 5 % IV SOLN
INTRAVENOUS | Status: AC
  Filled 2016-01-25: qty 10

## 2016-01-25 NOTE — Care Management Obs Status (Signed)
[facility] NOTIFICATION   Patient Details  Name: VALESKA COURTADE MRN: [id number] Date of Birth: 1940/10/20   Medicare Observation Status Notification Given:  Yes    Briant Sites, RN 01/25/2016, 12:13 PM

## 2016-01-25 NOTE — H&P (Signed)
History and Physical    [name] [name] [id number] DOB: 12-16-1940 DOA: 01/24/2016  PCP: [name], MD  Patient coming from: home  Chief Complaint: legs red and swollen  HPI: [name] [name] is a 75 y.o. female with medical history significant of lynphoma, DM, HLD, obesity comes in today for 2 days of bilateral leg swelling, redness and pain.  Pt reports she was having diarrhea last week.  She went to see her pcp on Friday and was diagnosed with a uti and placed on bactrim.  Her diarrhea got better.  Than yesterday her legs started to get red and swelling.  She has been having chills but and subjective fevers.  No trauma to legs.  She has not been eating and drinking normally.  Pt denies any sob.  Pt and her husband are overall very poor historiains.  Pt referred for admission for aki and possible cellulitis of her legs.   Review of Systems: As per HPI otherwise 10 point review of systems negative.   Past Medical History  Diagnosis Date  . Hypertension   . Diabetes mellitus, type 2 ([place])   . Hyperlipidemia   . Iron deficiency anemia 11/21/2012    At time of presentation.  Feraheme 1020 mg on 11/21/12.  . 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 Port catheter in place 06/06/2013  . Anemia of chronic renal failure, stage 4 (severe) ([place]) 07/30/2014    Past Surgical History  Procedure Laterality Date  . Cataract extraction    . Cystoscopy w/ ureteral stent placement Right 11/03/2012    Procedure: CYSTOSCOPY WITH RETROGRADE PYELOGRAM/URETERAL STENT PLACEMENT;  Surgeon: Malka So, MD;  Location: [facility];  Service: Urology;  Laterality: Right;  . Portacath placement Left 11/03/2012    Procedure: INSERTION PORT-A-CATH;  Surgeon: Scherry Ran, MD;  Location: [facility];  Service: General;  Laterality: Left;  Insertion Port-A-Cath Left Subclavian  . Appendectomy    . Cystoscopy w/ ureteral stent placement  Right 03/23/2013    Procedure: CYSTOSCOPY WITH STENT REPLACEMENT;  Surgeon: Malka So, MD;  Location: [facility];  Service: Urology;  Laterality: Right;     reports that she has never smoked. She has never used smokeless tobacco. She reports that she does not drink alcohol or use illicit drugs.  Allergies  Allergen Reactions  . Azithromycin     Blisters on tongue    Family History  Problem Relation Age of Onset  . Cancer Father   . Cancer Brother     Prior to Admission medications   Medication Sig Start Date End Date Taking? Authorizing Provider  ACCU-CHEK AVIVA PLUS test strip  11/20/14   Historical Provider, MD  acetaminophen (TYLENOL) 325 MG tablet Take 650 mg by mouth every 6 (six) hours as needed for pain.    Historical Provider, MD  atorvastatin (LIPITOR) 80 MG tablet Take 80 mg by mouth every morning.    Historical Provider, MD  BD PEN NEEDLE NANO U/F 32G X 4 MM MISC TEST BLOOD GLUCOSE ONCE DAILY 04/14/15   Historical Provider, MD  furosemide (LASIX) 20 MG tablet Take 20 mg by mouth daily. One tablet 3 times a week    Historical Provider, MD  glipiZIDE (GLUCOTROL XL) 5 MG 24 hr tablet  05/18/15   Historical Provider, MD  Insulin Glargine 300 UNIT/ML SOPN Inject 80 Units into the skin daily. 08/13/15 08/12/16  Historical Provider, MD  lidocaine-prilocaine (EMLA) cream  Apply 1 application topically as needed. 1 hour prior to [facility] 11/22/14 11/22/15  Patrici Ranks, MD  losartan (COZAAR) 100 MG tablet Take 100 mg by mouth daily. 08/21/14   Historical Provider, MD  Multiple Vitamin (MULTIVITAMIN WITH MINERALS) TABS Take 1 tablet by mouth daily.    Historical Provider, MD    Physical Exam: Filed Vitals:   01/25/16 0013 01/25/16 0100 01/25/16 0141 01/25/16 0211  BP: 146/51 152/53    Pulse: 77 79    Temp:      TempSrc:      Resp: 24  20   Height:      Weight:      SpO2: 96% 95%  98%      Constitutional: NAD, calm, comfortable Filed Vitals:   01/25/16 0013 01/25/16 0100  01/25/16 0141 01/25/16 0211  BP: 146/51 152/53    Pulse: 77 79    Temp:      TempSrc:      Resp: 24  20   Height:      Weight:      SpO2: 96% 95%  98%   Eyes: PERRL, lids and conjunctivae normal ENMT: Mucous membranes are moist. Posterior pharynx clear of any exudate or lesions.Normal dentition.  Neck: normal, supple, no masses, no thyromegaly Respiratory: clear to auscultation bilaterally, no wheezing, no crackles. Normal respiratory effort. No accessory muscle use.  Cardiovascular: Regular rate and rhythm, no murmurs / rubs / gallops. No extremity edema. 2+ pedal pulses. No carotid bruits.  Abdomen: no tenderness, no masses palpated. No hepatosplenomegaly. Bowel sounds positive.  Musculoskeletal: no clubbing / cyanosis. No joint deformity upper and lower extremities. Good ROM, no contractures. Normal muscle tone.  Skin:  ble cellulitis no lesions or ulcers.  No induration Neurologic: CN 2-12 grossly intact. Sensation intact, DTR normal. Strength 5/5 in all 4.  Psychiatric: Normal judgment and insight. Alert and oriented x 3. Normal mood.    Labs on Admission: I have personally reviewed following labs and imaging studies  CBC:  Recent Labs Lab 01/24/16 2219  WBC 5.9  NEUTROABS 4.2  HGB 10.1*  HCT 31.7*  MCV 94.1  PLT 123XX123   Basic Metabolic Panel:  Recent Labs Lab 01/24/16 2219  NA 135  K 4.7  CL 105  CO2 22  GLUCOSE 150*  BUN 49*  CREATININE 3.09*  CALCIUM 8.6*   GFR: Estimated Creatinine Clearance: 17.9 mL/min (by C-G formula based on Cr of 3.09).  Urine analysis:    Component Value Date/Time   COLORURINE YELLOW 01/24/2016 2146   APPEARANCEUR CLEAR 01/24/2016 2146   LABSPEC 1.010 01/24/2016 2146   PHURINE 6.0 01/24/2016 2146   GLUCOSEU NEGATIVE 01/24/2016 2146   HGBUR TRACE* 01/24/2016 2146   BILIRUBINUR NEGATIVE 01/24/2016 2146   [place] NEGATIVE 01/24/2016 2146   PROTEINUR TRACE* 01/24/2016 2146   UROBILINOGEN 0.2 11/22/2014 0900   NITRITE  NEGATIVE 01/24/2016 2146   LEUKOCYTESUR NEGATIVE 01/24/2016 2146    Radiological Exams on Admission: Dg Chest 2 View  01/25/2016  CLINICAL DATA:  75 year old female with generalized weakness and cough and shortness of breath EXAM: CHEST  2 VIEW COMPARISON:  CT dated 05/08/2013 FINDINGS: Two views of the chest do not demonstrate focal consolidation. There is no pleural effusion or pneumothorax. The cardiac silhouette is within normal limits. Left pectoral Port-A-Cath with tip over the central SVC. No acute fracture. Multiple surgical clips noted at the base of the neck on the right. IMPRESSION: No active cardiopulmonary disease. Electronically Signed   By:  Anner Crete M.D.   On: 01/25/2016 00:08     Assessment/Plan 75 yo female with recent uti on bactrim with mild worsening of her renal function and possible ble cellulitis  Principal Problem:   Dehydration- ivf, treat infections.    Active Problems:   Acute kidney injury superimposed on CKD ([place])-  Ivf.  Hold bactrim.     Follicular lymphoma ([place])- noted, cont outpt follow up   Chronic kidney disease (CKD), stage IV (severe) ([place])- baseline cr around 2.5 to 3.  Now 3.   Diabetes mellitus type 2 in obese ([place])- clarfiy home meds   Diarrhea- improved and resolved   Bilateral leg edema maybe due to cellulitis - place on iv rocephin   obs on medical   DVT prophylaxis: scds  Code Status: full code  DAVID,RACHAL A MD [facility]  If 7PM-7AM, please contact night-coverage [email or web address] Password [email or web address]  01/25/2016, 2:16 AM

## 2016-01-25 NOTE — Progress Notes (Signed)
Patient seen and examined, database reviewed. Discussed with multiple family members at bedside. Patient admitted earlier today from home with acute renal failure and bilateral lower extremity redness. Creatinine is improving with fluids, will continue. Will transition antibiotics to oral doxycycline and will complete a ten-day course. Anticipate discharge home in 24 hours as long as renal function continues to trend down.  Domingo Mend, MD [facility] Pager: [phone]

## 2016-01-26 DIAGNOSIS — N189 Chronic kidney disease, unspecified: Secondary | ICD-10-CM | POA: Diagnosis not present

## 2016-01-26 DIAGNOSIS — E860 Dehydration: Secondary | ICD-10-CM | POA: Diagnosis not present

## 2016-01-26 DIAGNOSIS — N179 Acute kidney failure, unspecified: Secondary | ICD-10-CM | POA: Diagnosis not present

## 2016-01-26 LAB — BASIC METABOLIC PANEL
ANION GAP: 8 (ref 5–15)
BUN: 38 mg/dL — AB (ref 6–20)
CALCIUM: 8.1 mg/dL — AB (ref 8.9–10.3)
CO2: 19 mmol/L — AB (ref 22–32)
CREATININE: 2.62 mg/dL — AB (ref 0.44–1.00)
Chloride: 111 mmol/L (ref 101–111)
GFR calc Af Amer: 19 mL/min — ABNORMAL LOW (ref >60)
GFR, EST NON AFRICAN AMERICAN: 17 mL/min — AB (ref >60)
GLUCOSE: 83 mg/dL (ref 65–99)
Potassium: 4.5 mmol/L (ref 3.5–5.1)
Sodium: 138 mmol/L (ref 135–145)

## 2016-01-26 LAB — CBC
HCT: 27.4 % — ABNORMAL LOW (ref 36.0–46.0)
HEMOGLOBIN: 8.7 g/dL — AB (ref 12.0–15.0)
MCH: 30.2 pg (ref 26.0–34.0)
MCHC: 31.8 g/dL (ref 30.0–36.0)
MCV: 95.1 fL (ref 78.0–100.0)
PLATELETS: 186 10*3/uL (ref 150–400)
RBC: 2.88 MIL/uL — ABNORMAL LOW (ref 3.87–5.11)
RDW: 17.1 % — AB (ref 11.5–15.5)
WBC: 4.5 10*3/uL (ref 4.0–10.5)

## 2016-01-26 NOTE — Discharge Summary (Signed)
Physician Discharge Summary  [name] [name] [id number] DOB: 06-12-1941 DOA: 01/24/2016  PCP: [name], MD  Admit date: 01/24/2016 Discharge date: 01/26/2016  Time spent: 45 minutes  Recommendations for Outpatient Follow-up:  Will be discharged home today Advised to follow-up with primary care provider in 2 weeks   Discharge Diagnoses:  Principal Problem:   Dehydration Active Problems:   Follicular lymphoma grade iiia, intrapelvic lymph nodes (HCC)   Chronic kidney disease (CKD), stage IV (severe) (HCC)   Diabetes mellitus type 2 in obese (HCC)   Diarrhea   Bilateral leg edema   Acute kidney injury superimposed on CKD ([place])   Cellulitis   AKI (acute kidney injury) ([place])   Discharge Condition: Stable and improved  Filed Weights   01/24/16 2133  Weight: 101.606 kg (224 lb)    History of present illness:  As per Dr. Shanon Brow on 7/16: [name] [name] is a 75 y.o. female with medical history significant of lynphoma, DM, HLD, obesity comes in today for 2 days of bilateral leg swelling, redness and pain. Pt reports she was having diarrhea last week. She went to see her pcp on Friday and was diagnosed with a uti and placed on bactrim. Her diarrhea got better. Than yesterday her legs started to get red and swelling. She has been having chills but and subjective fevers. No trauma to legs. She has not been eating and drinking normally. Pt denies any sob. Pt and her husband are overall very poor historiains. Pt referred for admission for aki and possible cellulitis of her legs.   Hospital Course:   Acute on chronic kidney disease stage IV -Baseline creatinine is around 2.3-2.6, was 3.09 on admission and down to 2.6 on discharge. -Advised close follow-up with nephrology.  Bilateral lower extremity redness -Was initially treated with antibiotics given concerns for cellulitis. -I do not believe this is cellulitis, I think this most likely represents venous  insufficiency, will discontinue antibiotic at this time.  Rest of chronic conditions are stable  Procedures:  None   Consultations:  None  Discharge Instructions  Discharge Instructions    Diet - low sodium heart healthy    Complete by:  As directed      Increase activity slowly    Complete by:  As directed             Medication List    STOP taking these medications        lidocaine-prilocaine cream  Commonly known as:  EMLA     sulfamethoxazole-trimethoprim 800-160 MG tablet  Commonly known as:  BACTRIM DS,SEPTRA DS      TAKE these medications        ACCU-CHEK AVIVA PLUS test strip  Generic drug:  glucose blood     acetaminophen 325 MG tablet  Commonly known as:  TYLENOL  Take 650 mg by mouth every 6 (six) hours as needed for pain.     atorvastatin 80 MG tablet  Commonly known as:  LIPITOR  Take 80 mg by mouth every morning.     BD PEN NEEDLE NANO U/F 32G X 4 MM Misc  Generic drug:  Insulin Pen Needle  TEST BLOOD GLUCOSE ONCE DAILY     furosemide 20 MG tablet  Commonly known as:  LASIX  Take 20 mg by mouth daily. One tablet 3 times a week     glipiZIDE 10 MG 24 hr tablet  Commonly known as:  GLUCOTROL XL  TAKE ONE TABLET (10 MG TOTAL)  BY MOUTH DAILY.     Insulin Glargine 300 UNIT/ML Sopn  Inject 80 Units into the skin daily.     losartan 100 MG tablet  Commonly known as:  COZAAR  Take 100 mg by mouth daily.     multivitamin with minerals Tabs tablet  Take 1 tablet by mouth daily.     ondansetron 8 MG tablet  Commonly known as:  ZOFRAN  Take 8 mg by mouth every 8 (eight) hours as needed.       Allergies  Allergen Reactions  . Azithromycin     Blisters on tongue       Follow-up Information    Follow up with [name], MD. Schedule an appointment as soon as possible for a visit in 2 weeks.   Specialty:  Family Medicine   Contact information:   [address] [place] [address] [phone]        The  results of significant diagnostics from this hospitalization (including imaging, microbiology, ancillary and laboratory) are listed below for reference.    Significant Diagnostic Studies: Dg Chest 2 View  01/25/2016  CLINICAL DATA:  75 year old female with generalized weakness and cough and shortness of breath EXAM: CHEST  2 VIEW COMPARISON:  CT dated 05/08/2013 FINDINGS: Two views of the chest do not demonstrate focal consolidation. There is no pleural effusion or pneumothorax. The cardiac silhouette is within normal limits. Left pectoral Port-A-Cath with tip over the central SVC. No acute fracture. Multiple surgical clips noted at the base of the neck on the right. IMPRESSION: No active cardiopulmonary disease. Electronically Signed   By: Anner Crete M.D.   On: 01/25/2016 00:08   Mm Diag Breast Tomo Uni Left  12/30/2015  CLINICAL DATA:  Post biopsy mammogram of the left breast for clip placement. EXAM: 3D DIAGNOSTIC LEFT MAMMOGRAM POST STEREOTACTIC BIOPSY COMPARISON:  Previous exam(s). FINDINGS: 3D Mammographic images were obtained following stereotactic guided biopsy of left breast asymmetry. The coil shaped biopsy marking clip is appropriately positioned at the site of the asymmetry in the slightly lateral, posterior left breast. IMPRESSION: Appropriate positioning of the coil shaped biopsy marking clip in the slightly lateral, posterior left breast. Final Assessment: Post Procedure Mammograms for Marker Placement Electronically Signed   By: Ammie Ferrier M.D.   On: 12/30/2015 11:25   Mm Lt Breast Bx W Loc Dev 1st Lesion Image Bx Spec Stereo Guide  01/01/2016  ADDENDUM REPORT: 01/01/2016 07:59 ADDENDUM: Pathology revealed FIBROCYSTIC CHANGES WITH FOCAL USUAL DUCTAL HYPERPLASIA AND ASSOCIATED CALCIFICATION, BENIGN DUCTS WITH ASSOCIATED CALCIFICATION of the Left lateral breast. This was found to be concordant by Dr. Ammie Ferrier. Pathology results were discussed with the patient by  telephone. The patient reported doing well after the biopsy with tenderness at the site. Post biopsy instructions and care were reviewed and questions were answered. The patient was encouraged to call [facility] for any additional concerns. Surgical consult and MRI of the breast is suggested given patient's complains of spontaneous left nipple discharge and mammographically seen anterior left skin thickening. Dr. Sharin Mons was notified by email with this information. Pathology results reported by Terie Purser, RN on 01/01/2016. Electronically Signed   By: Ammie Ferrier M.D.   On: 01/01/2016 07:59  01/01/2016  CLINICAL DATA:  75 year old female presenting for stereotactic biopsy of a left breast asymmetry. EXAM: LEFT BREAST STEREOTACTIC CORE NEEDLE BIOPSY COMPARISON:  Previous exams. FINDINGS: The patient and I discussed the procedure of stereotactic-guided biopsy including benefits  and alternatives. We discussed the high likelihood of a successful procedure. We discussed the risks of the procedure including infection, bleeding, tissue injury, clip migration, and inadequate sampling. Informed written consent was given. The usual time out protocol was performed immediately prior to the procedure. Using sterile technique and 1% Lidocaine as local anesthetic, under stereotactic guidance, a 9 gauge vacuum assisted device was used to perform core needle biopsy of an asymmetry in the slightly lateral posterior left breast seen on the MLO view using a superomedial approach (biopsy was done in the MLO view rather than the ML). At the conclusion of the procedure, a coil shaped tissue marker clip was deployed into the biopsy cavity. Follow-up 2-view mammogram was performed and dictated separately. IMPRESSION: Stereotactic-guided biopsy of an asymmetry in the slightly lateral, posterior left breast. No apparent complications. Electronically Signed: By: Ammie Ferrier M.D. On: 12/30/2015  11:24    Microbiology: No results found for this or any previous visit (from the past 240 hour(s)).   Labs: Basic Metabolic Panel:  Recent Labs Lab 01/24/16 2219 01/25/16 0637 01/26/16 0618  NA 135 137 138  K 4.7 4.4 4.5  CL 105 108 111  CO2 22 18* 19*  GLUCOSE 150* 135* 83  BUN 49* 45* 38*  CREATININE 3.09* 2.94* 2.62*  CALCIUM 8.6* 8.0* 8.1*   Liver Function Tests:  Recent Labs Lab 01/25/16 0509  AST 27  ALT 14  ALKPHOS 69  BILITOT 0.3  PROT 5.8*  ALBUMIN 3.1*   No results for input(s): LIPASE, AMYLASE in the last 168 hours. No results for input(s): AMMONIA in the last 168 hours. CBC:  Recent Labs Lab 01/24/16 2219 01/25/16 0637 01/26/16 0618  WBC 5.9 5.1 4.5  NEUTROABS 4.2  --   --   HGB 10.1* 8.9* 8.7*  HCT 31.7* 28.1* 27.4*  MCV 94.1 94.3 95.1  PLT 187 179 186   Cardiac Enzymes: No results for input(s): CKTOTAL, CKMB, CKMBINDEX, TROPONINI in the last 168 hours. BNP: BNP (last 3 results)  Recent Labs  01/24/16 2219  BNP 63.0    ProBNP (last 3 results) No results for input(s): PROBNP in the last 8760 hours.  CBG:  Recent Labs Lab 01/25/16 0753 01/25/16 1146 01/25/16 1654 01/25/16 2126  GLUCAP 94 127* 79 152*       Signed:  HERNANDEZ ACOSTA,ESTELA  [facility] Pager: [phone] 01/26/2016, 6:37 PM

## 2016-01-26 NOTE — Progress Notes (Signed)
Pt discharged via wheelchair into the care of her husband via private vehicle in stable condition.  Discharge instructions reviewed with pt/husband.  Pt/husband verbalized understanding.

## 2016-01-29 ENCOUNTER — Encounter (HOSPITAL_BASED_OUTPATIENT_CLINIC_OR_DEPARTMENT_OTHER): Payer: Medicare Other

## 2016-01-29 ENCOUNTER — Encounter (HOSPITAL_COMMUNITY): Payer: Medicare Other

## 2016-01-29 VITALS — BP 169/61 | HR 57 | Temp 98.6°F | Resp 22

## 2016-01-29 DIAGNOSIS — N184 Chronic kidney disease, stage 4 (severe): Secondary | ICD-10-CM

## 2016-01-29 DIAGNOSIS — D631 Anemia in chronic kidney disease: Secondary | ICD-10-CM | POA: Diagnosis not present

## 2016-01-29 DIAGNOSIS — R300 Dysuria: Secondary | ICD-10-CM | POA: Diagnosis not present

## 2016-01-29 LAB — CBC
HCT: 30.5 % — ABNORMAL LOW (ref 36.0–46.0)
Hemoglobin: 9.7 g/dL — ABNORMAL LOW (ref 12.0–15.0)
MCH: 30 pg (ref 26.0–34.0)
MCHC: 31.8 g/dL (ref 30.0–36.0)
MCV: 94.4 fL (ref 78.0–100.0)
PLATELETS: 221 10*3/uL (ref 150–400)
RBC: 3.23 MIL/uL — AB (ref 3.87–5.11)
RDW: 16.8 % — ABNORMAL HIGH (ref 11.5–15.5)
WBC: 5.3 10*3/uL (ref 4.0–10.5)

## 2016-01-29 MED ORDER — DARBEPOETIN ALFA 150 MCG/0.3ML IJ SOSY
125.0000 ug | PREFILLED_SYRINGE | Freq: Once | INTRAMUSCULAR | Status: AC
  Administered 2016-01-29: 125 ug via SUBCUTANEOUS
  Filled 2016-01-29: qty 0.3

## 2016-01-29 NOTE — Progress Notes (Signed)
[name] [name] presents today for injection per MD orders. Aranesp 167mcg administered SQ in left Abdomen. Administration without incident. Patient tolerated well. BP reading, as well as patient being asymptomatic, reported to [facility], [place] and he stated that she was fine for the injection without any further treatment.

## 2016-01-29 NOTE — Patient Instructions (Signed)
[facility] at [facility] Discharge Instructions  RECOMMENDATIONS MADE BY THE CONSULTANT AND ANY TEST RESULTS WILL BE SENT TO YOUR REFERRING PHYSICIAN. Aranesp today.    Thank you for choosing [facility] at [facility] to provide your oncology and hematology care.  To afford each patient quality time with our provider, please arrive at least 15 minutes before your scheduled appointment time.   Beginning January 23rd 2017 lab work for the Cancer Center will be done in the  Main lab at [facility] on 1st floor. If you have a lab appointment with the Cancer Center please come in thru the  Main Entrance and check in at the main information desk  You need to re-schedule your appointment should you arrive 10 or more minutes late.  We strive to give you quality time with our providers, and arriving late affects you and other patients whose appointments are after yours.  Also, if you no show three or more times for appointments you may be dismissed from the clinic at the providers discretion.     Again, thank you for choosing [facility].  Our hope is that these requests will decrease the amount of time that you wait before being seen by our physicians.       _____________________________________________________________  Should you have questions after your visit to [facility], please contact our office at [phone] between the hours of 8:30 a.m. and 4:30 p.m.  Voicemails left after 4:30 p.m. will not be returned until the following business day.  For prescription refill requests, have your pharmacy contact our office.         Resources For Cancer Patients and their Caregivers ? American Cancer Society: Can assist with transportation, wigs, general needs, runs Look Good Feel Better.        1-888-227-6333 ? Cancer Care: Provides financial assistance, online support groups, medication/co-pay assistance.  1-800-813-HOPE  (4673) ? [facility] Assists [place] cancer patients and their families through emotional , educational and financial support.  336-427-4357 ? [place] DSS Where to apply for food stamps, Medicaid and utility assistance. 336-342-1394 ? RCATS: Transportation to medical appointments. 336-347-2287 ? Social Security Administration: May apply for disability if have a Stage IV cancer. 336-342-7796 1-800-772-1213 ? [place] Aging, Disability and Transit Services: Assists with nutrition, care and transit needs. 336-349-2343  Cancer Center Support Programs: @10RELATIVEDAYS@ > Cancer Support Group  2nd Tuesday of the month 1pm-2pm, Journey Room  > Creative Journey  3rd Tuesday of the month 1130am-1pm, Journey Room  > Look Good Feel Better  1st Wednesday of the month 10am-12 noon, Journey Room (Call American Cancer Society to register 1-800-395-5775)    

## 2016-02-12 ENCOUNTER — Encounter (HOSPITAL_COMMUNITY): Payer: Self-pay

## 2016-02-12 ENCOUNTER — Encounter (HOSPITAL_COMMUNITY): Payer: Medicare Other

## 2016-02-12 ENCOUNTER — Encounter (HOSPITAL_COMMUNITY): Payer: Medicare Other | Attending: Oncology

## 2016-02-12 VITALS — BP 153/43 | HR 74 | Resp 16

## 2016-02-12 DIAGNOSIS — N184 Chronic kidney disease, stage 4 (severe): Secondary | ICD-10-CM | POA: Insufficient documentation

## 2016-02-12 DIAGNOSIS — D631 Anemia in chronic kidney disease: Secondary | ICD-10-CM

## 2016-02-12 DIAGNOSIS — C8290 Follicular lymphoma, unspecified, unspecified site: Secondary | ICD-10-CM | POA: Insufficient documentation

## 2016-02-12 DIAGNOSIS — R300 Dysuria: Secondary | ICD-10-CM | POA: Diagnosis present

## 2016-02-12 LAB — CBC
HCT: 29 % — ABNORMAL LOW (ref 36.0–46.0)
HEMOGLOBIN: 9.4 g/dL — AB (ref 12.0–15.0)
MCH: 30.9 pg (ref 26.0–34.0)
MCHC: 32.4 g/dL (ref 30.0–36.0)
MCV: 95.4 fL (ref 78.0–100.0)
PLATELETS: 198 10*3/uL (ref 150–400)
RBC: 3.04 MIL/uL — ABNORMAL LOW (ref 3.87–5.11)
RDW: 17.2 % — AB (ref 11.5–15.5)
WBC: 5.8 10*3/uL (ref 4.0–10.5)

## 2016-02-12 MED ORDER — DARBEPOETIN ALFA 150 MCG/0.3ML IJ SOSY
125.0000 ug | PREFILLED_SYRINGE | Freq: Once | INTRAMUSCULAR | Status: AC
  Administered 2016-02-12: 125 ug via SUBCUTANEOUS

## 2016-02-12 MED ORDER — DARBEPOETIN ALFA 150 MCG/0.3ML IJ SOSY
PREFILLED_SYRINGE | INTRAMUSCULAR | Status: AC
  Filled 2016-02-12: qty 0.3

## 2016-02-12 NOTE — Patient Instructions (Signed)
[place] at [facility] Discharge Instructions  RECOMMENDATIONS MADE BY THE CONSULTANT AND ANY TEST RESULTS WILL BE SENT TO YOUR REFERRING PHYSICIAN.  Aranesp injection given today per protocol. Follow up as scheduled. Call for any concerns or questions  Thank you for choosing [place] at [facility] to provide your oncology and hematology care.  To afford each patient quality time with our provider, please arrive at least 15 minutes before your scheduled appointment time.   Beginning January 23rd 2017 lab work for the [facility] will be done in the  Main lab at [facility] on 1st floor. If you have a lab appointment with the [place] [facility] please come in thru the  Main Entrance and check in at the main information desk  You need to re-schedule your appointment should you arrive 10 or more minutes late.  We strive to give you quality time with our providers, and arriving late affects you and other patients whose appointments are after yours.  Also, if you no show three or more times for appointments you may be dismissed from the clinic at the providers discretion.     Again, thank you for choosing [facility].  Our hope is that these requests will decrease the amount of time that you wait before being seen by our physicians.       _____________________________________________________________  Should you have questions after your visit to [facility], please contact our office at [phone] between the hours of 8:30 a.m. and 4:30 p.m.  Voicemails left after 4:30 p.m. will not be returned until the following business day.  For prescription refill requests, have your pharmacy contact our office.         Resources For Cancer Patients and their Caregivers ? American Cancer Society: Can assist with transportation, wigs, general needs, runs Look Good Feel Better.        (715) 838-1339 ? Cancer Care: Provides  financial assistance, online support groups, medication/co-pay assistance.  1-800-813-HOPE 309-740-1269) ? [place] Assists [place] cancer patients and their families through emotional , educational and financial support.  4844850076 ? [place] DSS Where to apply for food stamps, Medicaid and utility assistance. 240-678-3863 ? RCATS: Transportation to medical appointments. 332-318-1369 ? Social Security Administration: May apply for disability if have a Stage IV cancer. 423-763-7238 575-636-6781 ? [employer], Disability and Transit Services: Assists with nutrition, care and transit needs. [place] Support Programs: @10RELATIVEDAYS @ > Cancer Support Group  2nd Tuesday of the month 1pm-2pm, Journey Room  > Creative Journey  3rd Tuesday of the month 1130am-1pm, Journey Room  > Look Good Feel Better  1st Wednesday of the month 10am-12 noon, Journey Room (Call [place] to register [phone])

## 2016-02-12 NOTE — Progress Notes (Signed)
Kenzey L Pinnock presents today for injection per MD orders. Aranesp 125mcg administered SQ in right Abdomen. Administration without incident. Patient tolerated well.  

## 2016-02-26 ENCOUNTER — Encounter (HOSPITAL_BASED_OUTPATIENT_CLINIC_OR_DEPARTMENT_OTHER): Payer: Medicare Other

## 2016-02-26 ENCOUNTER — Encounter (HOSPITAL_COMMUNITY): Payer: Medicare Other

## 2016-02-26 VITALS — BP 146/43 | HR 71 | Temp 98.6°F | Resp 18

## 2016-02-26 DIAGNOSIS — N184 Chronic kidney disease, stage 4 (severe): Secondary | ICD-10-CM

## 2016-02-26 DIAGNOSIS — D631 Anemia in chronic kidney disease: Secondary | ICD-10-CM

## 2016-02-26 DIAGNOSIS — Z95828 Presence of other vascular implants and grafts: Secondary | ICD-10-CM

## 2016-02-26 DIAGNOSIS — R300 Dysuria: Secondary | ICD-10-CM | POA: Diagnosis not present

## 2016-02-26 LAB — CBC
HEMATOCRIT: 30.8 % — AB (ref 36.0–46.0)
Hemoglobin: 9.6 g/dL — ABNORMAL LOW (ref 12.0–15.0)
MCH: 30.2 pg (ref 26.0–34.0)
MCHC: 31.2 g/dL (ref 30.0–36.0)
MCV: 96.9 fL (ref 78.0–100.0)
Platelets: 146 10*3/uL — ABNORMAL LOW (ref 150–400)
RBC: 3.18 MIL/uL — ABNORMAL LOW (ref 3.87–5.11)
RDW: 17.5 % — AB (ref 11.5–15.5)
WBC: 5.4 10*3/uL (ref 4.0–10.5)

## 2016-02-26 MED ORDER — HEPARIN SOD (PORK) LOCK FLUSH 100 UNIT/ML IV SOLN
500.0000 [IU] | Freq: Once | INTRAVENOUS | Status: AC
  Administered 2016-02-26: 500 [IU] via INTRAVENOUS
  Filled 2016-02-26: qty 5

## 2016-02-26 MED ORDER — DARBEPOETIN ALFA 150 MCG/0.3ML IJ SOSY
125.0000 ug | PREFILLED_SYRINGE | Freq: Once | INTRAMUSCULAR | Status: AC
  Administered 2016-02-26: 125 ug via SUBCUTANEOUS

## 2016-02-26 MED ORDER — DARBEPOETIN ALFA 150 MCG/0.3ML IJ SOSY
PREFILLED_SYRINGE | INTRAMUSCULAR | Status: AC
  Filled 2016-02-26: qty 0.3

## 2016-02-26 MED ORDER — SODIUM CHLORIDE 0.9% FLUSH
10.0000 mL | INTRAVENOUS | Status: DC | PRN
  Administered 2016-02-26: 10 mL via INTRAVENOUS
  Filled 2016-02-26: qty 10

## 2016-02-26 NOTE — Progress Notes (Signed)
[name] [name] presented for Portacath access and flush.  Proper placement of portacath confirmed by CXR.  Portacath located left chest wall accessed with  H 20 needle.  No blood return but flushed easily without complaints. Portacath flushed with 37ml NS and 500U/45ml Heparin and needle removed intact.  Procedure tolerated well and without incident.  Hgb 9.6 Pt also tolerated Aranesp 125 mcg injection well without complaints. Pt discharged self ambulatory using cane in satisfactory condition with husband

## 2016-02-26 NOTE — Patient Instructions (Signed)
[place] at [facility] Discharge Instructions  RECOMMENDATIONS MADE BY THE CONSULTANT AND ANY TEST RESULTS WILL BE SENT TO YOUR REFERRING PHYSICIAN.  Aranesp injection given and port flushed today. Follow-up as scheduled. Call clinic for any questions or concerns  Thank you for choosing [place] at [facility] to provide your oncology and hematology care.  To afford each patient quality time with our provider, please arrive at least 15 minutes before your scheduled appointment time.   Beginning January 23rd 2017 lab work for the [facility] will be done in the  Main lab at [facility] on 1st floor. If you have a lab appointment with the [facility] please come in thru the  Main Entrance and check in at the main information desk  You need to re-schedule your appointment should you arrive 10 or more minutes late.  We strive to give you quality time with our providers, and arriving late affects you and other patients whose appointments are after yours.  Also, if you no show three or more times for appointments you may be dismissed from the clinic at the providers discretion.     Again, thank you for choosing [facility].  Our hope is that these requests will decrease the amount of time that you wait before being seen by our physicians.       _____________________________________________________________  Should you have questions after your visit to [facility], please contact our office at [phone] between the hours of 8:30 a.m. and 4:30 p.m.  Voicemails left after 4:30 p.m. will not be returned until the following business day.  For prescription refill requests, have your pharmacy contact our office.         Resources For Cancer Patients and their Caregivers ? American Cancer Society: Can assist with transportation, wigs, general needs, runs Look Good Feel Better.        629-492-1350 ? Cancer  Care: Provides financial assistance, online support groups, medication/co-pay assistance.  1-800-813-HOPE 785 588 6325) ? [place] Assists [place] cancer patients and their families through emotional , educational and financial support.  (813) 582-0570 ? [place] DSS Where to apply for food stamps, Medicaid and utility assistance. (616) 465-7100 ? RCATS: Transportation to medical appointments. (762) 229-1689 ? Social Security Administration: May apply for disability if have a Stage IV cancer. (313)802-0322 9477308843 ? [employer], Disability and Transit Services: Assists with nutrition, care and transit needs. [place] Support Programs: @10RELATIVEDAYS @ > Cancer Support Group  2nd Tuesday of the month 1pm-2pm, Journey Room  > Creative Journey  3rd Tuesday of the month 1130am-1pm, Journey Room  > Look Good Feel Better  1st Wednesday of the month 10am-12 noon, Journey Room (Call [place] to register [phone])

## 2016-03-02 ENCOUNTER — Encounter: Payer: Self-pay | Admitting: *Deleted

## 2016-03-02 NOTE — Progress Notes (Signed)
[place] Clinical Social Work  Clinical Social Work was referred by SW Tourist information centre manager from [facility] as pt appears to be experiencing financial hardship due to medical bills. CSW spoke to SW via phone and gathered specifics. CSW also spoke with Durene Cal , Financial Advocate to discuss possible options. Application for co-pay assistance being completed for LLS by Angie and faxed in today.   CSW also phoned pt to explore other needs/concerns and update her on assistance for treatment co-pays. Mrs Ronnald Ramp reported medical bills/expenses were her biggest concern. Patient reports they have attempted to apply for medicaid in the past, but were denied due to assets as patient owns land that puts her over the line. Due to their income level pt could possibly receive food stamps, but pt not really interested in pursuing at this time. CSW reviewed other local and national resources for assistance and CSW provided her with information for Cancer Care and [facility] [employer]. Pt plans to contact each and seek possible assistance. CSW also educated pt on resources through [facility] for support.   Pt provided with CSW contact info at both [facility] and [facility] and encouraged to share that and resource information with her husband as "he handles that stuff". CSW to follow and assist accordingly.   Clinical Social Work interventions: Sales executive education and referral   [place], [place] Tuesdays   [phone]

## 2016-03-11 ENCOUNTER — Other Ambulatory Visit (HOSPITAL_COMMUNITY): Payer: Self-pay | Admitting: Oncology

## 2016-03-11 ENCOUNTER — Encounter (HOSPITAL_COMMUNITY): Payer: Medicare Other

## 2016-03-11 ENCOUNTER — Encounter (HOSPITAL_BASED_OUTPATIENT_CLINIC_OR_DEPARTMENT_OTHER): Payer: Medicare Other

## 2016-03-11 VITALS — BP 171/56 | HR 82 | Temp 98.1°F | Resp 20

## 2016-03-11 DIAGNOSIS — N184 Chronic kidney disease, stage 4 (severe): Secondary | ICD-10-CM | POA: Diagnosis not present

## 2016-03-11 DIAGNOSIS — I10 Essential (primary) hypertension: Secondary | ICD-10-CM

## 2016-03-11 DIAGNOSIS — D631 Anemia in chronic kidney disease: Secondary | ICD-10-CM

## 2016-03-11 DIAGNOSIS — R300 Dysuria: Secondary | ICD-10-CM | POA: Diagnosis not present

## 2016-03-11 LAB — CBC
HCT: 30.4 % — ABNORMAL LOW (ref 36.0–46.0)
Hemoglobin: 9.4 g/dL — ABNORMAL LOW (ref 12.0–15.0)
MCH: 29.7 pg (ref 26.0–34.0)
MCHC: 30.9 g/dL (ref 30.0–36.0)
MCV: 95.9 fL (ref 78.0–100.0)
PLATELETS: 91 10*3/uL — AB (ref 150–400)
RBC: 3.17 MIL/uL — ABNORMAL LOW (ref 3.87–5.11)
RDW: 17.8 % — AB (ref 11.5–15.5)
WBC: 4.7 10*3/uL (ref 4.0–10.5)

## 2016-03-11 MED ORDER — CLONIDINE HCL 0.2 MG PO TABS
0.2000 mg | ORAL_TABLET | Freq: Once | ORAL | Status: AC
  Filled 2016-03-11: qty 1

## 2016-03-11 MED ORDER — DARBEPOETIN ALFA 150 MCG/0.3ML IJ SOSY
PREFILLED_SYRINGE | INTRAMUSCULAR | Status: AC
  Filled 2016-03-11: qty 0.3

## 2016-03-11 MED ORDER — CLONIDINE HCL 0.1 MG PO TABS
0.1000 mg | ORAL_TABLET | ORAL | Status: AC
  Administered 2016-03-11: 0.1 mg via ORAL
  Filled 2016-03-11: qty 1

## 2016-03-11 MED ORDER — DARBEPOETIN ALFA 150 MCG/0.3ML IJ SOSY
125.0000 ug | PREFILLED_SYRINGE | Freq: Once | INTRAMUSCULAR | Status: AC
  Administered 2016-03-11: 125 ug via SUBCUTANEOUS

## 2016-03-11 MED ORDER — CLONIDINE HCL 0.2 MG PO TABS
0.2000 mg | ORAL_TABLET | ORAL | Status: AC
  Administered 2016-03-11: 0.2 mg via ORAL

## 2016-03-11 NOTE — Progress Notes (Signed)
Pt here today for Aranesp injection. Pt's BP elevated 187/62. Spoke with Kirby Crigler, Aranesp held, Clonidine 0.2mg  given, waited 30 minutes. BP rechecked 171/56. Spoke with Kirby Crigler, he advised to give Clonidine 0.1mg  and go ahead and give the Aranesp. Clonidine and Arasnep injection given. Pt tolerated well. Pt stable and discharged home.

## 2016-03-11 NOTE — Patient Instructions (Signed)
[place] at [facility] Discharge Instructions  RECOMMENDATIONS MADE BY THE CONSULTANT AND ANY TEST RESULTS WILL BE SENT TO YOUR REFERRING PHYSICIAN.  You were given your Aranesp injection today.  Return as scheduled.   Thank you for choosing [place] at [facility] to provide your oncology and hematology care.  To afford each patient quality time with our provider, please arrive at least 15 minutes before your scheduled appointment time.   Beginning January 23rd 2017 lab work for the [facility] will be done in the  Main lab at [facility] on 1st floor. If you have a lab appointment with the [place] please come in thru the  Main Entrance and check in at the main information desk  You need to re-schedule your appointment should you arrive 10 or more minutes late.  We strive to give you quality time with our providers, and arriving late affects you and other patients whose appointments are after yours.  Also, if you no show three or more times for appointments you may be dismissed from the clinic at the providers discretion.     Again, thank you for choosing [facility].  Our hope is that these requests will decrease the amount of time that you wait before being seen by our physicians.       _____________________________________________________________  Should you have questions after your visit to [facility], please contact our office at [phone] between the hours of 8:30 a.m. and 4:30 p.m.  Voicemails left after 4:30 p.m. will not be returned until the following business day.  For prescription refill requests, have your pharmacy contact our office.         Resources For Cancer Patients and their Caregivers ? American Cancer Society: Can assist with transportation, wigs, general needs, runs Look Good Feel Better.        404-780-8689 ? Cancer Care: Provides financial assistance, online support  groups, medication/co-pay assistance.  1-800-813-HOPE (352)381-4835) ? [place] Assists [place] cancer patients and their families through emotional , educational and financial support.  (343)513-4645 ? [place] DSS Where to apply for food stamps, Medicaid and utility assistance. 3120317338 ? RCATS: Transportation to medical appointments. (320)714-0876 ? Social Security Administration: May apply for disability if have a Stage IV cancer. 5203362674 (430)840-2191 ? [employer], Disability and Transit Services: Assists with nutrition, care and transit needs. [place] Support Programs: @10RELATIVEDAYS @ > Cancer Support Group  2nd Tuesday of the month 1pm-2pm, Journey Room  > Creative Journey  3rd Tuesday of the month 1130am-1pm, Journey Room  > Look Good Feel Better  1st Wednesday of the month 10am-12 noon, Journey Room (Call [place] to register [phone])

## 2016-03-26 ENCOUNTER — Encounter (HOSPITAL_COMMUNITY): Payer: Medicare Other

## 2016-03-26 ENCOUNTER — Encounter (HOSPITAL_COMMUNITY): Payer: Medicare Other | Attending: Hematology & Oncology

## 2016-03-26 ENCOUNTER — Encounter (HOSPITAL_COMMUNITY): Payer: Self-pay

## 2016-03-26 VITALS — BP 156/45 | HR 85 | Temp 98.7°F | Resp 20

## 2016-03-26 DIAGNOSIS — D509 Iron deficiency anemia, unspecified: Secondary | ICD-10-CM | POA: Insufficient documentation

## 2016-03-26 DIAGNOSIS — D631 Anemia in chronic kidney disease: Secondary | ICD-10-CM

## 2016-03-26 DIAGNOSIS — N184 Chronic kidney disease, stage 4 (severe): Secondary | ICD-10-CM

## 2016-03-26 DIAGNOSIS — E785 Hyperlipidemia, unspecified: Secondary | ICD-10-CM | POA: Diagnosis not present

## 2016-03-26 DIAGNOSIS — I129 Hypertensive chronic kidney disease with stage 1 through stage 4 chronic kidney disease, or unspecified chronic kidney disease: Secondary | ICD-10-CM | POA: Insufficient documentation

## 2016-03-26 DIAGNOSIS — E1122 Type 2 diabetes mellitus with diabetic chronic kidney disease: Secondary | ICD-10-CM | POA: Diagnosis not present

## 2016-03-26 DIAGNOSIS — C8236 Follicular lymphoma grade IIIa, intrapelvic lymph nodes: Secondary | ICD-10-CM | POA: Diagnosis present

## 2016-03-26 LAB — CBC
HEMATOCRIT: 28.2 % — AB (ref 36.0–46.0)
Hemoglobin: 8.8 g/dL — ABNORMAL LOW (ref 12.0–15.0)
MCH: 30 pg (ref 26.0–34.0)
MCHC: 31.2 g/dL (ref 30.0–36.0)
MCV: 96.2 fL (ref 78.0–100.0)
PLATELETS: 65 10*3/uL — AB (ref 150–400)
RBC: 2.93 MIL/uL — ABNORMAL LOW (ref 3.87–5.11)
RDW: 18.3 % — AB (ref 11.5–15.5)
WBC: 3.7 10*3/uL — AB (ref 4.0–10.5)

## 2016-03-26 MED ORDER — DARBEPOETIN ALFA 150 MCG/0.3ML IJ SOSY
125.0000 ug | PREFILLED_SYRINGE | Freq: Once | INTRAMUSCULAR | Status: AC
  Administered 2016-03-26: 125 ug via SUBCUTANEOUS
  Filled 2016-03-26: qty 0.3

## 2016-03-26 NOTE — Progress Notes (Signed)
Pt here today for Aranesp injection. Pt given Aranesp in lower right abdomen. Pt tolerated well. Pt stable and discharged home ambulatory. Pt to return as scheduled.

## 2016-03-26 NOTE — Patient Instructions (Signed)
[place] at [facility] Discharge Instructions  RECOMMENDATIONS MADE BY THE CONSULTANT AND ANY TEST RESULTS WILL BE SENT TO YOUR REFERRING PHYSICIAN.  You were given an Aranesp injection. Return as scheduled.   Thank you for choosing [place] at [facility] to provide your oncology and hematology care.  To afford each patient quality time with our provider, please arrive at least 15 minutes before your scheduled appointment time.   Beginning January 23rd 2017 lab work for the [facility] will be done in the  Main lab at [facility] on 1st floor. If you have a lab appointment with the [facility] please come in thru the  Main Entrance and check in at the main information desk  You need to re-schedule your appointment should you arrive 10 or more minutes late.  We strive to give you quality time with our providers, and arriving late affects you and other patients whose appointments are after yours.  Also, if you no show three or more times for appointments you may be dismissed from the clinic at the providers discretion.     Again, thank you for choosing [facility].  Our hope is that these requests will decrease the amount of time that you wait before being seen by our physicians.       _____________________________________________________________  Should you have questions after your visit to [facility], please contact our office at [phone] between the hours of 8:30 a.m. and 4:30 p.m.  Voicemails left after 4:30 p.m. will not be returned until the following business day.  For prescription refill requests, have your pharmacy contact our office.         Resources For Cancer Patients and their Caregivers ? American Cancer Society: Can assist with transportation, wigs, general needs, runs Look Good Feel Better.        (938)100-1218 ? Cancer Care: Provides financial assistance, online support groups,  medication/co-pay assistance.  1-800-813-HOPE 618-025-8484) ? [place] Assists [place] cancer patients and their families through emotional , educational and financial support.  (404) 626-7789 ? [place] DSS Where to apply for food stamps, Medicaid and utility assistance. 845-477-1008 ? RC[employer]: Transportation to medical appointments. 952 176 6514 ? Social Security Administration: May apply for disability if have a Stage IV cancer. 3198261980 615-155-5819 ? [employer], Disability and Transit Services: Assists with nutrition, care and transit needs. [place] Support Programs: @10RELATIVEDAYS @ > Cancer Support Group  2nd Tuesday of the month 1pm-2pm, Journey Room  > Creative Journey  3rd Tuesday of the month 1130am-1pm, Journey Room  > Look Good Feel Better  1st Wednesday of the month 10am-12 noon, Journey Room (Call [place] to register [phone])

## 2016-03-31 ENCOUNTER — Encounter (HOSPITAL_BASED_OUTPATIENT_CLINIC_OR_DEPARTMENT_OTHER): Payer: Medicare Other | Admitting: Oncology

## 2016-03-31 ENCOUNTER — Encounter (HOSPITAL_COMMUNITY): Payer: Medicare Other

## 2016-03-31 ENCOUNTER — Encounter (HOSPITAL_COMMUNITY): Payer: Self-pay | Admitting: Oncology

## 2016-03-31 VITALS — BP 172/57 | HR 87 | Temp 98.4°F | Resp 18 | Ht 65.0 in | Wt 229.0 lb

## 2016-03-31 DIAGNOSIS — D509 Iron deficiency anemia, unspecified: Secondary | ICD-10-CM

## 2016-03-31 DIAGNOSIS — C8236 Follicular lymphoma grade IIIa, intrapelvic lymph nodes: Secondary | ICD-10-CM | POA: Diagnosis not present

## 2016-03-31 DIAGNOSIS — D631 Anemia in chronic kidney disease: Secondary | ICD-10-CM

## 2016-03-31 DIAGNOSIS — N184 Chronic kidney disease, stage 4 (severe): Secondary | ICD-10-CM | POA: Diagnosis not present

## 2016-03-31 DIAGNOSIS — E875 Hyperkalemia: Secondary | ICD-10-CM

## 2016-03-31 LAB — COMPREHENSIVE METABOLIC PANEL
ALK PHOS: 76 U/L (ref 38–126)
ALT: 17 U/L (ref 14–54)
AST: 21 U/L (ref 15–41)
Albumin: 3.4 g/dL — ABNORMAL LOW (ref 3.5–5.0)
Anion gap: 6 (ref 5–15)
BUN: 57 mg/dL — ABNORMAL HIGH (ref 6–20)
CALCIUM: 8.5 mg/dL — AB (ref 8.9–10.3)
CO2: 22 mmol/L (ref 22–32)
CREATININE: 3.36 mg/dL — AB (ref 0.44–1.00)
Chloride: 105 mmol/L (ref 101–111)
GFR, EST AFRICAN AMERICAN: 14 mL/min — AB (ref >60)
GFR, EST NON AFRICAN AMERICAN: 12 mL/min — AB (ref >60)
Glucose, Bld: 234 mg/dL — ABNORMAL HIGH (ref 65–99)
Potassium: 5.5 mmol/L — ABNORMAL HIGH (ref 3.5–5.1)
SODIUM: 133 mmol/L — AB (ref 135–145)
Total Bilirubin: 0.2 mg/dL — ABNORMAL LOW (ref 0.3–1.2)
Total Protein: 6.4 g/dL — ABNORMAL LOW (ref 6.5–8.1)

## 2016-03-31 LAB — IRON AND TIBC
Iron: 80 ug/dL (ref 28–170)
Saturation Ratios: 25 % (ref 10.4–31.8)
TIBC: 321 ug/dL (ref 250–450)
UIBC: 241 ug/dL

## 2016-03-31 LAB — CBC WITH DIFFERENTIAL/PLATELET
BASOS PCT: 1 %
Basophils Absolute: 0 10*3/uL (ref 0.0–0.1)
EOS PCT: 2 %
Eosinophils Absolute: 0.1 10*3/uL (ref 0.0–0.7)
HEMATOCRIT: 27.8 % — AB (ref 36.0–46.0)
HEMOGLOBIN: 8.5 g/dL — AB (ref 12.0–15.0)
LYMPHS PCT: 17 %
Lymphs Abs: 0.7 10*3/uL (ref 0.7–4.0)
MCH: 30.2 pg (ref 26.0–34.0)
MCHC: 30.6 g/dL (ref 30.0–36.0)
MCV: 98.9 fL (ref 78.0–100.0)
MONOS PCT: 16 %
Monocytes Absolute: 0.7 10*3/uL (ref 0.1–1.0)
NEUTROS PCT: 64 %
Neutro Abs: 2.7 10*3/uL (ref 1.7–7.7)
Platelets: 69 10*3/uL — ABNORMAL LOW (ref 150–400)
RBC: 2.81 MIL/uL — ABNORMAL LOW (ref 3.87–5.11)
RDW: 17.9 % — ABNORMAL HIGH (ref 11.5–15.5)
WBC: 4.2 10*3/uL (ref 4.0–10.5)

## 2016-03-31 LAB — FERRITIN: Ferritin: 1099 ng/mL — ABNORMAL HIGH (ref 11–307)

## 2016-03-31 LAB — VITAMIN B12: VITAMIN B 12: 1127 pg/mL — AB (ref 180–914)

## 2016-03-31 LAB — LACTATE DEHYDROGENASE: LDH: 342 U/L — ABNORMAL HIGH (ref 98–192)

## 2016-03-31 MED ORDER — SODIUM POLYSTYRENE SULFONATE PO POWD: Freq: Once | ORAL | 0 refills | Status: AC

## 2016-03-31 NOTE — Patient Instructions (Signed)
[place] at [facility] Discharge Instructions  RECOMMENDATIONS MADE BY THE CONSULTANT AND ANY TEST RESULTS WILL BE SENT TO YOUR REFERRING PHYSICIAN.  You were seen today by Kirby Crigler PA-C. Return in 2 weeks for labs and Aranesp injection. Return in 3 months for follow up visit.    Thank you for choosing [place] at [facility] to provide your oncology and hematology care.  To afford each patient quality time with our provider, please arrive at least 15 minutes before your scheduled appointment time.   Beginning January 23rd 2017 lab work for the [facility] will be done in the  Main lab at [facility] on 1st floor. If you have a lab appointment with the [facility] please come in thru the  Main Entrance and check in at the main information desk  You need to re-schedule your appointment should you arrive 10 or more minutes late.  We strive to give you quality time with our providers, and arriving late affects you and other patients whose appointments are after yours.  Also, if you no show three or more times for appointments you may be dismissed from the clinic at the providers discretion.     Again, thank you for choosing [facility].  Our hope is that these requests will decrease the amount of time that you wait before being seen by our physicians.       _____________________________________________________________  Should you have questions after your visit to [facility], please contact our office at [phone] between the hours of 8:30 a.m. and 4:30 p.m.  Voicemails left after 4:30 p.m. will not be returned until the following business day.  For prescription refill requests, have your pharmacy contact our office.         Resources For Cancer Patients and their Caregivers ? American Cancer Society: Can assist with transportation, wigs, general needs, runs Look Good Feel Better.         458-058-6248 ? Cancer Care: Provides financial assistance, online support groups, medication/co-pay assistance.  1-800-813-HOPE 913 870 0113) ? [place] Assists [place] cancer patients and their families through emotional , educational and financial support.  (301)843-0089 ? [place] DSS Where to apply for food stamps, Medicaid and utility assistance. 317 152 2091 ? RCATS: Transportation to medical appointments. 606-540-5666 ? Social Security Administration: May apply for disability if have a Stage IV cancer. (418)660-6532 907-227-1236 ? [employer], Disability and Transit Services: Assists with nutrition, care and transit needs. [place] Support Programs: @10RELATIVEDAYS @ > Cancer Support Group  2nd Tuesday of the month 1pm-2pm, Journey Room  > Creative Journey  3rd Tuesday of the month 1130am-1pm, Journey Room  > Look Good Feel Better  1st Wednesday of the month 10am-12 noon, Journey Room (Call [place] to register [phone])

## 2016-03-31 NOTE — Progress Notes (Addendum)
[name] Mustache, MD [place] 0000000  Follicular lymphoma grade iiia, intrapelvic lymph nodes ([place]) - Plan: CBC with Differential, Comprehensive metabolic panel, Lactate dehydrogenase, CBC with Differential, Comprehensive metabolic panel, Lactate dehydrogenase, CBC with Differential, Comprehensive metabolic panel, Lactate dehydrogenase  Anemia of chronic renal failure, stage 4 (severe) (HCC) - Plan: CBC with Differential, Comprehensive metabolic panel, Vitamin 123456, Folate, CBC with Differential, Comprehensive metabolic panel, Vitamin 123456, Folate  Iron deficiency anemia - Plan: CBC with Differential, Iron and TIBC, Ferritin, CBC with Differential, Iron and TIBC, Ferritin  Hyperkalemia - Plan: sodium polystyrene (KAYEXALATE) powder  CURRENT THERAPY: Surveillance per NCCN guidelines for Follicular Lymphoma.  Aranesp 125 mcg every 2 weeks  INTERVAL HISTORY: [name] [name] 75 y.o. 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 This encases the right ureter with severe right hydroureteronephrosis. Although the mass is nonspecifi...      09/29/2012 Imaging    MRI pelvis- Large approximately 9 cm retroperitoneal mass, which encases the common iliac vessels and extends into the presacral space and right S1 neural foramen. Differential diagnosis includes  lymphoma, retroperitoneal sarcoma, and metastatic diseas      10/11/2012 Initial Diagnosis    Diagnosis Retroperitoneal mass, biopsy - NON-HODGKIN'S B CELL LYMPHOMA.      11/07/2012 PET scan    Large presacral hypermetabolic nodal mass with multiple borderline enlarged and minimally enlarged but hypermetabolic lymph nodes in the retroperitoneum, the middle mediastinum, and left supraclavicular region, as above.      11/28/2012 - 04/26/2013 Chemotherapy    BR x 6 cycles      02/07/2013 Imaging    CT abd/pelvis- Interval decrease in size of the paraspinous/presacral nodal mass encasing the IVC and aortic bifurcations.      05/08/2013 Remission    CT CAP- No evidence of thoracic lymphoma.  Stable presacral mass with associated chronic right ureteral obstruction. The double-J right ureteral stent is unchanged in position. 2. No evidence progressive abdominal pelvic lymphoma.      07/25/2013 - 07/17/2015 Chemotherapy    Maintenance Rituxan x 2 years      She notes that she feels well. She denies any active complaints today.  She notes that last week she had an episode of loose stools and low-grade fever 47F. She notes that it resolved within 24 hours. She denies any nausea or vomiting during this time.  She denies any B symptoms.  She denies any new lumps or bumps.  She denies any other episodes of fevers.  Appetite is good.  Weight is stable.  She denies any abdominal pain.  She does have some mild LE edema.  She relays to me that she saw her PCP recently who prescribed Septra DS for a possible skin infection of right lower leg.  She took this medication for a few days but discontinued the medication early due to possible side effects.  Review of Systems  Constitutional: Negative.  Negative for chills,  fever, malaise/fatigue and weight loss.  HENT: Negative.   Eyes: Negative.   Respiratory: Negative.  Negative for cough.   Cardiovascular: Negative.  Negative for chest pain.    Gastrointestinal: Negative.  Negative for abdominal pain, constipation, diarrhea, nausea and vomiting.  Genitourinary: Negative.   Musculoskeletal: Negative.  Negative for falls.  Skin: Negative.   Neurological: Negative.  Negative for weakness.  Endo/Heme/Allergies: Negative.   Psychiatric/Behavioral: Negative.     Past Medical History:  Diagnosis Date  . Anemia of chronic renal failure, stage 4 (severe) ([place]) 07/30/2014  . Diabetes mellitus, type 2 ([place])   . Follicular lymphoma ([place]) 11/20/2012   Right ureteral obstruction by lymphadenopathy-> right hydronephrosis  . Hyperlipidemia   . Hypertension   . Iron deficiency anemia 11/21/2012   At time of presentation.  Feraheme 1020 mg on 11/21/12.  Marland Kitchen Port catheter in place 06/06/2013    Past Surgical History:  Procedure Laterality Date  . APPENDECTOMY    . CATARACT EXTRACTION    . CYSTOSCOPY W/ URETERAL STENT PLACEMENT Right 11/03/2012   Procedure: CYSTOSCOPY WITH RETROGRADE PYELOGRAM/URETERAL STENT PLACEMENT;  Surgeon: Malka So, MD;  Location: [facility];  Service: Urology;  Laterality: Right;  . CYSTOSCOPY W/ URETERAL STENT PLACEMENT Right 03/23/2013   Procedure: CYSTOSCOPY WITH STENT REPLACEMENT;  Surgeon: Malka So, MD;  Location: [facility];  Service: Urology;  Laterality: Right;  . PORTACATH PLACEMENT Left 11/03/2012   Procedure: INSERTION PORT-A-CATH;  Surgeon: Scherry Ran, MD;  Location: [facility];  Service: General;  Laterality: Left;  Insertion Port-A-Cath Left Subclavian    Family History  Problem Relation Age of Onset  . Cancer Father   . Cancer Brother     Social History   Social History  . Marital status: Married    Spouse name: N/A  . Number of children: N/A  . Years of education: N/A   Social History Main Topics  . Smoking status: Never Smoker  . Smokeless tobacco: Never Used  . Alcohol use No  . Drug use: No  . Sexual activity: No   Other Topics Concern  . None   Social History Narrative  . None      PHYSICAL EXAMINATION  ECOG PERFORMANCE STATUS: 2 - Symptomatic, <50% confined to bed  Vitals:   03/31/16 1059  BP: (!) 172/57  Pulse: 87  Resp: 18  Temp: 98.4 F (36.9 C)    GENERAL:alert, no distress, well nourished, well developed, comfortable, cooperative, obese, smiling and accompanied by husband SKIN: skin color, texture, turgor are normal, no rashes or significant lesions HEAD: Normocephalic, No masses, lesions, tenderness or abnormalities EYES: normal, EOMI, Conjunctiva are pink and non-injected EARS: External ears normal OROPHARYNX:lips, buccal mucosa, and tongue normal and mucous membranes are moist  NECK: supple, trachea midline LYMPH:  no palpable lymphadenopathy BREAST:not examined LUNGS: clear to auscultation and percussion HEART: regular rate & rhythm, no gallops, S1 normal, S2 normal and systolic ejection murmur heard ABDOMEN:abdomen soft, non-tender, obese and normal bowel sounds BACK: Back symmetric, no curvature. EXTREMITIES:less then 2 second capillary refill, no joint deformities, effusion, or inflammation, no skin discoloration, no cyanosis  NEURO: alert & oriented x 3 with fluent speech, no focal motor/sensory deficits, gait normal   LABORATORY DATA: CBC    Component Value Date/Time   WBC 4.2 03/31/2016 1139   RBC 2.81 (L) 03/31/2016 1139   HGB 8.5 (L) 03/31/2016 1139   HCT 27.8 (L) 03/31/2016 1139   PLT 69 (L) 03/31/2016 1139  MCV 98.9 03/31/2016 1139   MCH 30.2 03/31/2016 1139   MCHC 30.6 03/31/2016 1139   RDW 17.9 (H) 03/31/2016 1139   LYMPHSABS 0.7 03/31/2016 1139   MONOABS 0.7 03/31/2016 1139   EOSABS 0.1 03/31/2016 1139   BASOSABS 0.0 03/31/2016 1139      Chemistry      Component Value Date/Time   NA 133 (L) 03/31/2016 1139   K 5.5 (H) 03/31/2016 1139   CL 105 03/31/2016 1139   CO2 22 03/31/2016 1139   BUN 57 (H) 03/31/2016 1139   CREATININE 3.36 (H) 03/31/2016 1139      Component Value Date/Time   CALCIUM 8.5 (L)  03/31/2016 1139   ALKPHOS 76 03/31/2016 1139   AST 21 03/31/2016 1139   ALT 17 03/31/2016 1139   BILITOT 0.2 (L) 03/31/2016 1139        PENDING LABS:   RADIOGRAPHIC STUDIES:  No results found.   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  Oncology history updated.  Recent labs reviewed.  I personally reviewed and went over laboratory results with the patient.  The results are noted within this dictation.  I note a sudden change in her HGB and platelet count.  As a result, I would like to update labs today.    Orders are placed for CBC diff, CMET, LDH, anemia panel.  I personally reviewed and went over laboratory results with the patient.  The results are noted within this dictation.  Hyperkalemia is noted, likely secondary to renal disease.  She is not on K+ replacement therapy according to medication list.  Rx is escribed to her pharmacy for Kayexalate 30 gram, 1 time dose.  If her vitamin studies return and are unimpressive, she will be re-called for bone marrow aspiration and biopsy and re-imaging due to concern for possible recurrence.  She would be a candidate for single-agent Rituxan.  Other etiologies is recent viral infection and recent Septra DS use.  She has an appointment with nephrology tomorrow in [place].  Labs in 3 months: CBC diff, CMET, LDH.  I personally reviewed and went over radiographic studies with the patient.  The results are noted within this dictation.  Recent mammogram is reviewed along with recent pathology results from breast biopsy.   Return in 3 months for follow-up.  If all is well in 3 months, we can consider  decreasing frequency of office visits to every 4 months.    Addendum: Vitamin studies are unimpressive.  Therefore, order is placed for bone marrow aspiration and biopsy and CT abd/pelvis wo contrast.  Anemia of chronic renal failure, stage 4 (severe) Anemia of chronic renal disease, Stage IV.  On ESA therapy and current dose is 125 mcg of Aranesp every 2 weeks.  Will continue with this supportive therapy plan and adjust the dose accordingly.  Aranesp injection in ~1 week.  Labs every 2 weeks: CBC  Ferritin every 90 days.   ORDERS PLACED FOR THIS ENCOUNTER: Orders Placed This Encounter  Procedures  . CBC with Differential  . Comprehensive metabolic panel  . Lactate dehydrogenase  . Vitamin B12  . Folate  . Iron and TIBC  . Ferritin  . CBC with Differential  . Comprehensive metabolic panel  . Lactate  dehydrogenase    MEDICATIONS PRESCRIBED THIS ENCOUNTER: Meds ordered this encounter  Medications  . sodium polystyrene (KAYEXALATE) powder    Sig: Take by mouth once.    Dispense:  30 g    Refill:  0    Order Specific Question:   Supervising Provider    Answer:   Patrici Ranks [id number]    THERAPY PLAN:  The NCCN guidelines for surveillance of Follicular Lymphoma are (3.2017):  A. H+P every 3-6 months x 5 years and then annually or as indicated  B. Labs every 3-6 months x 5 years and then annually or as indicated  C. Surveillance imaging no more than every 6 months x 2 years post-treatment and then no more than annually or as clinically indicated.  All questions were answered. The patient knows to call the clinic with any problems, questions or concerns. We can certainly see the patient much sooner if necessary.  Patient and plan discussed with Dr. Ancil Linsey and she is in agreement with the aforementioned.   This note is electronically signed by: Doy Mince 04/01/2016 6:03 PM

## 2016-03-31 NOTE — Assessment & Plan Note (Signed)
Anemia of chronic renal disease, Stage IV.  On ESA therapy and current dose is 125 mcg of Aranesp every 2 weeks.  Will continue with this supportive therapy plan and adjust the dose accordingly.  Aranesp injection in ~1 week.  Labs every 2 weeks: CBC  Ferritin every 90 days.

## 2016-03-31 NOTE — Assessment & Plan Note (Addendum)
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  Oncology history updated.  Recent labs reviewed.  I personally reviewed and went over laboratory results with the patient.  The results are noted within this dictation.  I note a sudden change in her HGB and platelet count.  As a result, I would like to update labs today.    Orders are placed for CBC diff, CMET, LDH, anemia panel.  I personally reviewed and went over laboratory results with the patient.  The results are noted within this dictation.  Hyperkalemia is noted, likely secondary to renal disease.  She is not on K+ replacement therapy according to medication list.  Rx is escribed to her pharmacy for Kayexalate 30 gram, 1 time dose.  If her vitamin studies return and are unimpressive, she will be re-called for bone marrow aspiration and biopsy and re-imaging due to concern for possible recurrence.  She would be a candidate for single-agent Rituxan.  Other etiologies is recent viral infection and recent Septra DS use.  She has an appointment with nephrology tomorrow in [place].  Labs in 3 months: CBC diff, CMET, LDH.  I personally reviewed and went over radiographic studies with the patient.  The results are noted within this dictation.  Recent mammogram is reviewed along with recent pathology results from breast biopsy.   Return in 3 months for follow-up.  If all is well in 3 months, we can consider decreasing frequency of office visits to every 4 months.    Addendum: Vitamin studies are unimpressive.  Therefore, order is placed for bone marrow aspiration and biopsy and CT abd/pelvis wo contrast.

## 2016-04-01 ENCOUNTER — Other Ambulatory Visit (HOSPITAL_COMMUNITY): Payer: Self-pay | Admitting: Oncology

## 2016-04-01 DIAGNOSIS — C8236 Follicular lymphoma grade IIIa, intrapelvic lymph nodes: Secondary | ICD-10-CM

## 2016-04-01 LAB — FOLATE: Folate: 26.1 ng/mL (ref >5.9)

## 2016-04-05 ENCOUNTER — Other Ambulatory Visit: Payer: Self-pay | Admitting: Radiology

## 2016-04-05 ENCOUNTER — Other Ambulatory Visit: Payer: Self-pay | Admitting: General Surgery

## 2016-04-06 ENCOUNTER — Encounter: Payer: Self-pay | Admitting: Oncology

## 2016-04-06 ENCOUNTER — Ambulatory Visit (HOSPITAL_COMMUNITY)
Admission: RE | Admit: 2016-04-06 | Discharge: 2016-04-06 | Disposition: A | Discharge status: Home / Self Care | Payer: Medicare Other | Source: Ambulatory Visit | Attending: Oncology | Admitting: Oncology

## 2016-04-06 ENCOUNTER — Encounter (HOSPITAL_COMMUNITY): Payer: Self-pay

## 2016-04-06 DIAGNOSIS — I129 Hypertensive chronic kidney disease with stage 1 through stage 4 chronic kidney disease, or unspecified chronic kidney disease: Secondary | ICD-10-CM | POA: Diagnosis not present

## 2016-04-06 DIAGNOSIS — E785 Hyperlipidemia, unspecified: Secondary | ICD-10-CM | POA: Diagnosis not present

## 2016-04-06 DIAGNOSIS — D509 Iron deficiency anemia, unspecified: Secondary | ICD-10-CM | POA: Insufficient documentation

## 2016-04-06 DIAGNOSIS — N184 Chronic kidney disease, stage 4 (severe): Secondary | ICD-10-CM | POA: Insufficient documentation

## 2016-04-06 DIAGNOSIS — Z8572 Personal history of non-Hodgkin lymphomas: Secondary | ICD-10-CM | POA: Insufficient documentation

## 2016-04-06 DIAGNOSIS — D696 Thrombocytopenia, unspecified: Secondary | ICD-10-CM | POA: Diagnosis not present

## 2016-04-06 DIAGNOSIS — Z9221 Personal history of antineoplastic chemotherapy: Secondary | ICD-10-CM | POA: Diagnosis not present

## 2016-04-06 DIAGNOSIS — E1122 Type 2 diabetes mellitus with diabetic chronic kidney disease: Secondary | ICD-10-CM | POA: Insufficient documentation

## 2016-04-06 DIAGNOSIS — C8236 Follicular lymphoma grade IIIa, intrapelvic lymph nodes: Secondary | ICD-10-CM

## 2016-04-06 DIAGNOSIS — Z794 Long term (current) use of insulin: Secondary | ICD-10-CM | POA: Insufficient documentation

## 2016-04-06 DIAGNOSIS — N6452 Nipple discharge: Secondary | ICD-10-CM

## 2016-04-06 DIAGNOSIS — N644 Mastodynia: Secondary | ICD-10-CM

## 2016-04-06 LAB — BONE MARROW EXAM

## 2016-04-06 LAB — PROTIME-INR
INR: 1.08
Prothrombin Time: 14 seconds (ref 11.4–15.2)

## 2016-04-06 LAB — CBC
HEMATOCRIT: 26.4 % — AB (ref 36.0–46.0)
HEMOGLOBIN: 8.4 g/dL — AB (ref 12.0–15.0)
MCH: 30.8 pg (ref 26.0–34.0)
MCHC: 31.8 g/dL (ref 30.0–36.0)
MCV: 96.7 fL (ref 78.0–100.0)
PLATELETS: 51 10*3/uL — AB (ref 150–400)
RBC: 2.73 MIL/uL — AB (ref 3.87–5.11)
RDW: 18.8 % — ABNORMAL HIGH (ref 11.5–15.5)
WBC: 4 10*3/uL (ref 4.0–10.5)

## 2016-04-06 LAB — GLUCOSE, CAPILLARY: GLUCOSE-CAPILLARY: 177 mg/dL — AB (ref 65–99)

## 2016-04-06 LAB — APTT: aPTT: 22 seconds — ABNORMAL LOW (ref 24–36)

## 2016-04-06 MED ORDER — MIDAZOLAM HCL 2 MG/2ML IJ SOLN
INTRAMUSCULAR | Status: AC
  Filled 2016-04-06: qty 6

## 2016-04-06 MED ORDER — FENTANYL CITRATE (PF) 100 MCG/2ML IJ SOLN
INTRAMUSCULAR | Status: AC | PRN
  Administered 2016-04-06: 50 ug via INTRAVENOUS

## 2016-04-06 MED ORDER — FENTANYL CITRATE (PF) 100 MCG/2ML IJ SOLN
INTRAMUSCULAR | Status: AC
  Filled 2016-04-06: qty 4

## 2016-04-06 MED ORDER — SODIUM CHLORIDE 0.9 % IV SOLN
INTRAVENOUS | Status: DC
Start: 2016-04-06 — End: 2016-04-07
  Administered 2016-04-06: 08:00:00 via INTRAVENOUS

## 2016-04-06 MED ORDER — MIDAZOLAM HCL 2 MG/2ML IJ SOLN
INTRAMUSCULAR | Status: AC | PRN
  Administered 2016-04-06 (×2): 1 mg via INTRAVENOUS

## 2016-04-06 NOTE — Procedures (Signed)
CT-guided  R iliac bone marrow aspiration and core biopsy No complication No blood loss. See complete dictation in [facility] PACS  

## 2016-04-06 NOTE — Progress Notes (Signed)
Referring Physician(s): Baird Cancer  Supervising Physician: Arne Cleveland  Patient Status:  Outpatient  Chief Complaint:  "I'm here for a bine marrow biopsy"  Subjective: Pt familiar  to IR service from prior CT-guided retroperitoneal mass biopsy in 2014. She has a history of follicular non-Hodgkin's lymphoma ,s/p chemotherapy.  She also has anemia of chronic renal failure as well as thrombocytopenia. She presents today for CT-guided bone marrow biopsy for follow-up evaluation. She currently denies fever, headache, chest pain, cough, abdominal pain, nausea, vomiting or abnormal bleeding. She has some dyspnea with exertion, fatigue and occasional left hip discomfort. Past Medical History:  Diagnosis Date  . Anemia of chronic renal failure, stage 4 (severe) ([place]) 07/30/2014  . Diabetes mellitus, type 2 ([place])   . Follicular lymphoma ([place]) 11/20/2012   Right ureteral obstruction by lymphadenopathy-> right hydronephrosis  . Hyperlipidemia   . Hypertension   . Iron deficiency anemia 11/21/2012   At time of presentation.  Feraheme 1020 mg on 11/21/12.  Marland Kitchen Port catheter in place 06/06/2013   Past Surgical History:  Procedure Laterality Date  . APPENDECTOMY    . CATARACT EXTRACTION    . CYSTOSCOPY W/ URETERAL STENT PLACEMENT Right 11/03/2012   Procedure: CYSTOSCOPY WITH RETROGRADE PYELOGRAM/URETERAL STENT PLACEMENT;  Surgeon: Malka So, MD;  Location: [facility];  Service: Urology;  Laterality: Right;  . CYSTOSCOPY W/ URETERAL STENT PLACEMENT Right 03/23/2013   Procedure: CYSTOSCOPY WITH STENT REPLACEMENT;  Surgeon: Malka So, MD;  Location: [facility];  Service: Urology;  Laterality: Right;  . PORTACATH PLACEMENT Left 11/03/2012   Procedure: INSERTION PORT-A-CATH;  Surgeon: Scherry Ran, MD;  Location: [facility];  Service: General;  Laterality: Left;  Insertion Port-A-Cath Left Subclavian     Allergies: Azithromycin  Medications: Prior to Admission medications   Medication  Sig Start Date End Date Taking? Authorizing Provider  acetaminophen (TYLENOL) 325 MG tablet Take 650 mg by mouth every 6 (six) hours as needed for pain.   Yes Historical Provider, MD  atorvastatin (LIPITOR) 80 MG tablet Take 80 mg by mouth every morning.   Yes Historical Provider, MD  furosemide (LASIX) 20 MG tablet Take by mouth. 12/15/15 12/14/16 Yes Historical Provider, MD  glipiZIDE (GLUCOTROL XL) 10 MG 24 hr tablet TAKE ONE TABLET (10 MG TOTAL) BY MOUTH DAILY. 01/02/16  Yes Historical Provider, MD  Insulin Glargine 300 UNIT/ML SOPN Inject 80 Units into the skin daily. 08/13/15 08/12/16 Yes Historical Provider, MD  Multiple Vitamin (MULTIVITAMIN WITH MINERALS) TABS Take 1 tablet by mouth daily.   Yes Historical Provider, MD  triamcinolone (NASACORT) 55 MCG/ACT AERO nasal inhaler Place into the nose. 03/03/16 03/03/17 Yes Historical Provider, MD  [place] test strip  11/20/14   Historical Provider, MD  BD PEN NEEDLE NANO U/F 32G X 4 MM MISC TEST BLOOD GLUCOSE ONCE DAILY 04/14/15   Historical Provider, MD  Insulin Pen Needle (BD PEN NEEDLE NANO U/F) 32G X 4 MM MISC USE WITH INSULIN AS DIRECTED 02/16/16   Historical Provider, MD  losartan (COZAAR) 100 MG tablet Take 100 mg by mouth daily. 08/21/14   Historical Provider, MD  Misc. Devices MISC Diabetic shoes. Dx E11.9 , comfort diabetis shoes/insoles 03/23/16   Historical Provider, MD  ondansetron (ZOFRAN) 8 MG tablet Take 8 mg by mouth every 8 (eight) hours as needed.     Historical Provider, MD     Vital Signs: BP (!) 156/54   Pulse 85   Temp 98.7 F (37.1 C) (Oral)  Resp 16   Ht _0  (1.651 m)   Wt 229 lb (103.9 kg)   SpO2 98%   BMI 38.11 kg/m   Physical Exam patient awake, alert. Chest clear to auscultation bilaterally. Clean, intact of chest wall Port-A-Cath. Heart with regular rate and rhythm. Abdomen soft, obese, positive bowel sounds, nontender. Lower extremities with 1- 2+ edema bilaterally  Imaging: No results  found.  Labs:  CBC:  Recent Labs  03/11/16 1042 03/26/16 1027 03/31/16 1139 04/06/16 0750  WBC 4.7 3.7* 4.2 4.0  HGB 9.4* 8.8* 8.5* 8.4*  HCT 30.4* 28.2* 27.8* 26.4*  PLT 91* 65* 69* PENDING    COAGS:  Recent Labs  04/06/16 0750  INR 1.08  APTT 22*    BMP:  Recent Labs  01/24/16 2219 01/25/16 0637 01/26/16 0618 03/31/16 1139  NA 135 137 138 133*  K 4.7 4.4 4.5 5.5*  CL 105 108 111 105  CO2 22 18* 19* 22  GLUCOSE 150* 135* 83 234*  BUN 49* 45* 38* 57*  CALCIUM 8.6* 8.0* 8.1* 8.5*  CREATININE 3.09* 2.94* 2.62* 3.36*  GFRNONAA 14* 15* 17* 12*  GFRAA 16* 17* 19* 14*    LIVER FUNCTION TESTS:  Recent Labs  10/09/15 0955 01/01/16 1253 01/25/16 0509 03/31/16 1139  BILITOT 0.4 0.5 0.3 0.2*  AST _1 ALT _2 ALKPHOS 91 82 69 76  PROT 6.4* 6.7 5.8* 6.4*  ALBUMIN 3.6 3.6 3.1* 3.4*    Assessment and Plan: Patient with history of follicular non-Hodgkin's lymphoma diagnosed in 2014, status post chemotherapy. Also with anemia of chronic renal disease, thrombocytopenia. She presents today for CT-guided bone marrow biopsy for follow-up evaluation.Risks and benefits discussed with the patien/husbandt including, but not limited to bleeding, infection, damage to adjacent structures or low yield requiring additional tests.All of the patient's questions were answered, patient is agreeable to proceed.Consent signed and in chart.     Electronically Signed: D. Rowe Robert 04/06/2016, 8:33 AM   I spent a total of 25 minutes at the the patient's bedside AND on the patient's hospital floor or unit, greater than 50% of which was counseling/coordinating care for CT guided bone marrow biopsy    Patient ID: [name] [name], female   DOB: 1940/08/16, 75 y.o.   MRN: [id number]

## 2016-04-06 NOTE — Discharge Instructions (Signed)
Bone Marrow Aspiration and Bone Marrow Biopsy, Care After Refer to this sheet in the next few weeks. These instructions provide you with information about caring for yourself after your procedure. Your health care provider may also give you more specific instructions. Your treatment has been planned according to current medical practices, but problems sometimes occur. Call your health care provider if you have any problems or questions after your procedure. WHAT TO EXPECT AFTER THE PROCEDURE After your procedure, it is common to have:  Soreness or tenderness around the puncture site.  Bruising. HOME CARE INSTRUCTIONS  Take medicines only as directed by your health care provider.  Follow your health care provider's instructions about:  Puncture site care.  Bandage (dressing) changes and removal.  Bathe and shower as directed by your health care provider.  Check your puncture site every day for signs of infection. Watch for:  Redness, swelling, or pain.  Fluid, blood, or pus.  Return to your normal activities as directed by your health care provider.  Keep all follow-up visits as directed by your health care provider. This is important. SEEK MEDICAL CARE IF:  You have a fever.  You have uncontrollable bleeding.  You have redness, swelling, or pain at the site of your puncture.  You have fluid, blood, or pus coming from your puncture site.   This information is not intended to replace advice given to you by your health care provider. Make sure you discuss any questions you have with your health care provider.   Document Released: 01/15/2005 Document Revised: 11/12/2014 Document Reviewed: 06/19/2014 Elsevier Interactive Patient Education 2016 Elsevier Inc. Moderate Conscious Sedation, Adult, Care After Refer to this sheet in the next few weeks. These instructions provide you with information on caring for yourself after your procedure. Your health care provider may also give  you more specific instructions. Your treatment has been planned according to current medical practices, but problems sometimes occur. Call your health care provider if you have any problems or questions after your procedure. WHAT TO EXPECT AFTER THE PROCEDURE  After your procedure:  You may feel sleepy, clumsy, and have poor balance for several hours.  Vomiting may occur if you eat too soon after the procedure. HOME CARE INSTRUCTIONS  Do not participate in any activities where you could become injured for at least 24 hours. Do not:  Drive.  Swim.  Ride a bicycle.  Operate heavy machinery.  Cook.  Use power tools.  Climb ladders.  Work from a high place.  Do not make important decisions or sign legal documents until you are improved.  If you vomit, drink water, juice, or soup when you can drink without vomiting. Make sure you have little or no nausea before eating solid foods.  Only take over-the-counter or prescription medicines for pain, discomfort, or fever as directed by your health care provider.  Make sure you and your family fully understand everything about the medicines given to you, including what side effects may occur.  You should not drink alcohol, take sleeping pills, or take medicines that cause drowsiness for at least 24 hours.  If you smoke, do not smoke without supervision.  If you are feeling better, you may resume normal activities 24 hours after you were sedated.  Keep all appointments with your health care provider. SEEK MEDICAL CARE IF:  Your skin is pale or bluish in color.  You continue to feel nauseous or vomit.  Your pain is getting worse and is not helped by medicine.  You have bleeding or swelling.  You are still sleepy or feeling clumsy after 24 hours. SEEK IMMEDIATE MEDICAL CARE IF:  You develop a rash.  You have difficulty breathing.  You develop any type of allergic problem.  You have a fever. MAKE SURE YOU:  Understand  these instructions.  Will watch your condition.  Will get help right away if you are not doing well or get worse.   This information is not intended to replace advice given to you by your health care provider. Make sure you discuss any questions you have with your health care provider.   Document Released: 04/18/2013 Document Revised: 07/19/2014 Document Reviewed: 04/18/2013 Elsevier Interactive Patient Education [employer].

## 2016-04-07 ENCOUNTER — Telehealth (HOSPITAL_COMMUNITY): Payer: Self-pay | Admitting: *Deleted

## 2016-04-07 ENCOUNTER — Ambulatory Visit (HOSPITAL_COMMUNITY)
Admission: RE | Admit: 2016-04-07 | Discharge: 2016-04-07 | Disposition: A | Discharge status: Home / Self Care | Payer: Medicare Other | Source: Ambulatory Visit | Attending: Oncology | Admitting: Oncology

## 2016-04-07 DIAGNOSIS — K8020 Calculus of gallbladder without cholecystitis without obstruction: Secondary | ICD-10-CM | POA: Diagnosis not present

## 2016-04-07 DIAGNOSIS — N9489 Other specified conditions associated with female genital organs and menstrual cycle: Secondary | ICD-10-CM | POA: Diagnosis not present

## 2016-04-07 DIAGNOSIS — I2510 Atherosclerotic heart disease of native coronary artery without angina pectoris: Secondary | ICD-10-CM | POA: Diagnosis not present

## 2016-04-07 DIAGNOSIS — N261 Atrophy of kidney (terminal): Secondary | ICD-10-CM | POA: Diagnosis not present

## 2016-04-07 DIAGNOSIS — I700 Atherosclerosis of aorta: Secondary | ICD-10-CM | POA: Diagnosis not present

## 2016-04-07 DIAGNOSIS — C8253 Diffuse follicle center lymphoma, intra-abdominal lymph nodes: Secondary | ICD-10-CM | POA: Insufficient documentation

## 2016-04-07 DIAGNOSIS — K5730 Diverticulosis of large intestine without perforation or abscess without bleeding: Secondary | ICD-10-CM | POA: Diagnosis not present

## 2016-04-07 NOTE — Telephone Encounter (Signed)
-----   Message from Baird Cancer, PA-C sent at 04/07/2016  4:18 PM EDT ----- Looks good.  Let her know.  Waiting on bone marrow results.

## 2016-04-07 NOTE — Telephone Encounter (Signed)
Pt aware of CT results. Pt also aware that we are still waiting on the results from the bone marrow biopsy.

## 2016-04-08 ENCOUNTER — Other Ambulatory Visit (HOSPITAL_COMMUNITY): Payer: Self-pay | Admitting: Oncology

## 2016-04-08 DIAGNOSIS — D631 Anemia in chronic kidney disease: Secondary | ICD-10-CM

## 2016-04-08 DIAGNOSIS — N184 Chronic kidney disease, stage 4 (severe): Principal | ICD-10-CM

## 2016-04-09 ENCOUNTER — Encounter (HOSPITAL_COMMUNITY): Payer: Medicare Other

## 2016-04-09 DIAGNOSIS — C8236 Follicular lymphoma grade IIIa, intrapelvic lymph nodes: Secondary | ICD-10-CM | POA: Diagnosis not present

## 2016-04-09 DIAGNOSIS — D631 Anemia in chronic kidney disease: Secondary | ICD-10-CM

## 2016-04-09 DIAGNOSIS — N184 Chronic kidney disease, stage 4 (severe): Principal | ICD-10-CM

## 2016-04-09 LAB — CBC
HCT: 26.3 % — ABNORMAL LOW (ref 36.0–46.0)
Hemoglobin: 8.2 g/dL — ABNORMAL LOW (ref 12.0–15.0)
MCH: 30.7 pg (ref 26.0–34.0)
MCHC: 31.2 g/dL (ref 30.0–36.0)
MCV: 98.5 fL (ref 78.0–100.0)
Platelets: 42 10*3/uL — ABNORMAL LOW (ref 150–400)
RBC: 2.67 MIL/uL — ABNORMAL LOW (ref 3.87–5.11)
RDW: 18.3 % — ABNORMAL HIGH (ref 11.5–15.5)
WBC: 3.5 10*3/uL — ABNORMAL LOW (ref 4.0–10.5)

## 2016-04-09 NOTE — Progress Notes (Signed)
Patient presented today for an aranesp injection. Labs reviewed with MD, a hemoglobin of 8.2 today. Per MD we will discontinue Aranesp injections for now. We will schedule her an appointment for labs and doctor visit in two weeks.

## 2016-04-11 ENCOUNTER — Other Ambulatory Visit (HOSPITAL_COMMUNITY): Payer: Self-pay | Admitting: Oncology

## 2016-04-11 DIAGNOSIS — C8236 Follicular lymphoma grade IIIa, intrapelvic lymph nodes: Secondary | ICD-10-CM

## 2016-04-19 LAB — TISSUE HYBRIDIZATION (BONE MARROW)-NCBH

## 2016-04-19 LAB — CHROMOSOME ANALYSIS, BONE MARROW

## 2016-04-22 ENCOUNTER — Encounter (HOSPITAL_COMMUNITY): Payer: Medicare Other

## 2016-04-22 ENCOUNTER — Encounter (HOSPITAL_COMMUNITY): Payer: Self-pay | Admitting: Oncology

## 2016-04-22 ENCOUNTER — Encounter: Payer: Self-pay | Admitting: Gastroenterology

## 2016-04-22 ENCOUNTER — Encounter (HOSPITAL_COMMUNITY): Payer: Medicare Other | Attending: Oncology | Admitting: Oncology

## 2016-04-22 VITALS — BP 157/55 | HR 88 | Temp 98.0°F | Resp 18 | Wt 221.5 lb

## 2016-04-22 DIAGNOSIS — N189 Chronic kidney disease, unspecified: Secondary | ICD-10-CM | POA: Diagnosis not present

## 2016-04-22 DIAGNOSIS — I2510 Atherosclerotic heart disease of native coronary artery without angina pectoris: Secondary | ICD-10-CM | POA: Insufficient documentation

## 2016-04-22 DIAGNOSIS — E785 Hyperlipidemia, unspecified: Secondary | ICD-10-CM | POA: Insufficient documentation

## 2016-04-22 DIAGNOSIS — I700 Atherosclerosis of aorta: Secondary | ICD-10-CM | POA: Diagnosis not present

## 2016-04-22 DIAGNOSIS — F411 Generalized anxiety disorder: Secondary | ICD-10-CM

## 2016-04-22 DIAGNOSIS — F419 Anxiety disorder, unspecified: Secondary | ICD-10-CM

## 2016-04-22 DIAGNOSIS — Z8572 Personal history of non-Hodgkin lymphomas: Secondary | ICD-10-CM

## 2016-04-22 DIAGNOSIS — K8020 Calculus of gallbladder without cholecystitis without obstruction: Secondary | ICD-10-CM | POA: Diagnosis not present

## 2016-04-22 DIAGNOSIS — C8290 Follicular lymphoma, unspecified, unspecified site: Secondary | ICD-10-CM | POA: Insufficient documentation

## 2016-04-22 DIAGNOSIS — I129 Hypertensive chronic kidney disease with stage 1 through stage 4 chronic kidney disease, or unspecified chronic kidney disease: Secondary | ICD-10-CM | POA: Diagnosis not present

## 2016-04-22 DIAGNOSIS — D4622 Refractory anemia with excess of blasts 2: Secondary | ICD-10-CM

## 2016-04-22 DIAGNOSIS — N281 Cyst of kidney, acquired: Secondary | ICD-10-CM | POA: Diagnosis not present

## 2016-04-22 DIAGNOSIS — N184 Chronic kidney disease, stage 4 (severe): Secondary | ICD-10-CM | POA: Insufficient documentation

## 2016-04-22 DIAGNOSIS — D631 Anemia in chronic kidney disease: Secondary | ICD-10-CM

## 2016-04-22 DIAGNOSIS — E1122 Type 2 diabetes mellitus with diabetic chronic kidney disease: Secondary | ICD-10-CM | POA: Insufficient documentation

## 2016-04-22 DIAGNOSIS — D509 Iron deficiency anemia, unspecified: Secondary | ICD-10-CM | POA: Diagnosis not present

## 2016-04-22 DIAGNOSIS — Z9221 Personal history of antineoplastic chemotherapy: Secondary | ICD-10-CM | POA: Insufficient documentation

## 2016-04-22 DIAGNOSIS — C8236 Follicular lymphoma grade IIIa, intrapelvic lymph nodes: Secondary | ICD-10-CM

## 2016-04-22 DIAGNOSIS — K5730 Diverticulosis of large intestine without perforation or abscess without bleeding: Secondary | ICD-10-CM | POA: Insufficient documentation

## 2016-04-22 HISTORY — DX: Refractory anemia with excess of blasts 2: D46.22

## 2016-04-22 LAB — CBC WITH DIFFERENTIAL/PLATELET
BASOS ABS: 0 10*3/uL (ref 0.0–0.1)
Basophils Relative: 1 %
EOS ABS: 0.1 10*3/uL (ref 0.0–0.7)
Eosinophils Relative: 2 %
HCT: 24.1 % — ABNORMAL LOW (ref 36.0–46.0)
HEMOGLOBIN: 7.7 g/dL — AB (ref 12.0–15.0)
LYMPHS PCT: 25 %
Lymphs Abs: 1 10*3/uL (ref 0.7–4.0)
MCH: 31 pg (ref 26.0–34.0)
MCHC: 32 g/dL (ref 30.0–36.0)
MCV: 97.2 fL (ref 78.0–100.0)
MONO ABS: 0.6 10*3/uL (ref 0.1–1.0)
Monocytes Relative: 15 %
NEUTROS PCT: 57 %
Neutro Abs: 2.3 10*3/uL (ref 1.7–7.7)
PLATELETS: 37 10*3/uL — AB (ref 150–400)
RBC: 2.48 MIL/uL — AB (ref 3.87–5.11)
RDW: 19.3 % — ABNORMAL HIGH (ref 11.5–15.5)
WBC: 4 10*3/uL (ref 4.0–10.5)

## 2016-04-22 LAB — SAMPLE TO BLOOD BANK

## 2016-04-22 MED ORDER — ESCITALOPRAM OXALATE 10 MG PO TABS: 10.0000 mg | ORAL_TABLET | Freq: Every day | ORAL | 1 refills | Status: AC

## 2016-04-22 MED ORDER — ALPRAZOLAM 0.25 MG PO TABS: 0.2500 mg | ORAL_TABLET | Freq: Two times a day (BID) | ORAL | 1 refills | Status: DC | PRN

## 2016-04-22 NOTE — Patient Instructions (Addendum)
[place] at [facility] Discharge Instructions  RECOMMENDATIONS MADE BY THE CONSULTANT AND ANY TEST RESULTS WILL BE SENT TO YOUR REFERRING PHYSICIAN.  You were seen by Gershon Mussel today We reviewed your bone marrow biopsy results.  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  Treatment options for this medication based upon your current state taking in consideration your kidney function is Revlimid (chemo-pill) or Hospice.  Based upon our conversation today, you will consider your options and let us know your decision soon.  We will not schedule you for a return appointment at this time until you make a decision regarding your options listed above.  Please call us with any questions/concerns.  Thank you for choosing [place] at [facility] to provide your oncology and hematology care.  To afford each patient quality time with our provider, please arrive at least 15 minutes before your scheduled appointment time.   Beginning January 23rd 2017 lab work for the [facility] will be done in the  Main lab at [facility] on 1st floor. If you have a lab appointment with the [facility] please come in thru the  Main Entrance and check in at the main information desk  You need to re-schedule your appointment should you arrive 10 or more minutes late.  We strive to give you quality time with our providers, and arriving late affects you and other patients whose appointments are after yours.  Also, if you no show three or more times for appointments you may be dismissed from the clinic at the providers discretion.     Again, thank you for choosing [facility].  Our hope is that these requests will decrease the amount of time that you wait before being seen by our physicians.       _____________________________________________________________  Should you have  questions after your visit to [facility], please contact our office at [phone] between the hours of 8:30 a.m. and 4:30 p.m.  Voicemails left after 4:30 p.m. will not be returned until the following business day.  For prescription refill requests, have your pharmacy contact our office.         Resources For Cancer Patients and their Caregivers ? American Cancer Society: Can assist with transportation, wigs, general needs, runs Look Good Feel Better.        (825)136-2283 ? Cancer Care: Provides financial assistance, online support groups, medication/co-pay assistance.  1-800-813-HOPE 313-792-5653) ? [place] Assists [place] cancer patients and their families through emotional , educational and financial support.  848-765-5798 ? [place] DSS Where to apply for food stamps, Medicaid and utility assistance. 270-299-2567 ? [facility]: Transportation to medical appointments. 3521512386 ? Social Security Administration: May apply for disability if have a Stage IV cancer. 276-178-7669 2064394333 ? [employer], Disability and Transit Services: Assists with nutrition, care and transit needs. [place] Support Programs: _0 @ > Cancer Support Group  2nd Tuesday of the month 1pm-2pm, Journey Room  > Creative Journey  3rd Tuesday of the month 1130am-1pm, Journey Room  > Look Good Feel Better  1st Wednesday of the month 10am-12 noon, Journey Room (Call [place] to register [phone])

## 2016-04-22 NOTE — Progress Notes (Signed)
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  Aranesp 125 mcg every 2 weeks  INTERVAL HISTORY: [name] [name] 75 y.o. 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    Follicular lymphoma grade iiia, intrapelvic lymph nodes ([name])   09/22/2012 Imaging    CT abd/pelvis- Obstructing presacral mass just inferior to the aortic bifurcation in the retroperitoneum, extending from the L4-S2 vertebral levels. This encases the right ureter with severe right hydroureteronephrosis. Although the mass is nonspecifi...      09/29/2012 Imaging    MRI pelvis- Large approximately 9 cm retroperitoneal mass, which encases the common iliac vessels and extends into the  presacral space and right S1 neural foramen. Differential diagnosis includes lymphoma, retroperitoneal sarcoma, and metastatic diseas      10/11/2012 Initial Diagnosis    Diagnosis Retroperitoneal mass, biopsy - NON-HODGKIN'S B CELL LYMPHOMA.      11/07/2012 PET scan    Large presacral hypermetabolic nodal mass with multiple borderline enlarged and minimally enlarged but hypermetabolic lymph nodes in the retroperitoneum, the middle mediastinum, and left supraclavicular region, as above.      11/28/2012 - 04/26/2013 Chemotherapy    BR x 6 cycles      02/07/2013 Imaging    CT abd/pelvis- Interval decrease in size of the paraspinous/presacral nodal mass encasing the IVC and aortic bifurcations.      05/08/2013 Remission    CT CAP- No evidence of thoracic lymphoma.  Stable presacral mass with associated chronic right ureteral obstruction. The double-J right ureteral stent is unchanged in position. 2. No evidence progressive abdominal pelvic lymphoma.      07/25/2013 - 07/17/2015 Chemotherapy    Maintenance Rituxan x 2 years      04/06/2016 Bone Marrow Biopsy    IR bone marrow aspiration and biopsy      04/07/2016 Imaging    Ct abd/pelvis- 1. Soft tissue density in the pre-lumbosacral space encasing the bilateral common iliac vessels has decreased slightly since 05/08/2013 and is most consistent with treated lymphoma / post treatment change. 2. Otherwise no lymphadenopathy or other findings of active lymphoma in the abdomen or pelvis. 3. Interval progression of asymmetric severe right renal atrophy. Stable moderate left renal atrophy. No hydronephrosis. 4. Simple 1.6 cm right adnexal cyst, mildly increased since 2014, for which no further follow-up is required. This recommendation follows ACR consensus guidelines: White Paper of the ACR Incidental Findings Committee II on Adnexal Findings. J Am Coll Radiol [phone].  5. Additional findings include aortic atherosclerosis,  coronary atherosclerosis, cholelithiasis and mild sigmoid diverticulosis.       She reports decrease in energy and "jittery."  She denies any known blood loss.    She is here to learn of her bone marrow biopsy result.  Review of Systems  Constitutional: Negative.  Negative for chills, fever, malaise/fatigue and weight loss.  HENT: Negative.   Eyes: Negative.   Respiratory: Negative.  Negative for cough.   Cardiovascular: Negative.  Negative for chest pain.  Gastrointestinal: Negative.  Negative for abdominal pain, constipation, diarrhea, nausea and vomiting.  Genitourinary: Negative.   Musculoskeletal: Negative.  Negative for falls.  Skin: Negative.   Neurological: Negative.  Negative for weakness.  Endo/Heme/Allergies: Negative.   Psychiatric/Behavioral: Negative.     Past Medical History:  Diagnosis Date  . Anemia of chronic renal failure, stage 4 (severe) ([place]) 07/30/2014  . Diabetes mellitus, type 2 ([place])   . Follicular lymphoma ([place]) 11/20/2012   Right ureteral obstruction by lymphadenopathy-> right hydronephrosis  . Hyperlipidemia   . Hypertension   . Iron deficiency anemia 11/21/2012   At time of presentation.  Feraheme 1020 mg on 11/21/12.  Marland Kitchen Port catheter in place 06/06/2013  . RAEB-2 (refractory anemia with excess blasts-2) ([place]) 04/22/2016    Past Surgical History:  Procedure Laterality Date  . APPENDECTOMY    . CATARACT EXTRACTION    . CYSTOSCOPY W/ URETERAL STENT PLACEMENT Right 11/03/2012   Procedure: CYSTOSCOPY WITH RETROGRADE PYELOGRAM/URETERAL STENT PLACEMENT;  Surgeon: Malka So, MD;  Location: [facility];  Service: Urology;  Laterality: Right;  . CYSTOSCOPY W/ URETERAL STENT PLACEMENT Right 03/23/2013   Procedure: CYSTOSCOPY WITH STENT REPLACEMENT;  Surgeon: Malka So, MD;  Location: [facility];  Service: Urology;  Laterality: Right;  . PORTACATH PLACEMENT Left 11/03/2012   Procedure: INSERTION PORT-A-CATH;  Surgeon: Scherry Ran, MD;  Location: [facility];   Service: General;  Laterality: Left;  Insertion Port-A-Cath Left Subclavian    Family History  Problem Relation Age of Onset  . Cancer Father   . Cancer Brother     Social History   Social History  . Marital status: Married    Spouse name: N/A  . Number of children: N/A  . Years of education: N/A   Social History Main Topics  . Smoking status: Never Smoker  . Smokeless tobacco: Never Used  . Alcohol use No  . Drug use: No  . Sexual activity: No   Other Topics Concern  . None   Social History Narrative  . None     PHYSICAL EXAMINATION  ECOG PERFORMANCE STATUS: 2 - Symptomatic, <50% confined to bed  Vitals:   04/22/16 1300  BP: (!) 157/55  Pulse: 88  Resp: 18  Temp: 98 F (36.7 C)    GENERAL:alert, no distress, well nourished, well developed, comfortable, cooperative, obese, smiling and tearful at times, and accompanied by husband SKIN: skin color, texture, turgor are normal, no rashes or significant lesions HEAD: Normocephalic, No masses, lesions, tenderness or abnormalities EYES: normal, EOMI, Conjunctiva are pink and non-injected EARS: External ears normal OROPHARYNX:lips, buccal mucosa, and tongue normal and mucous membranes are moist  NECK: supple, trachea midline LYMPH:  no palpable lymphadenopathy BREAST:not examined LUNGS: clear to auscultation and percussion HEART: regular rate & rhythm, no gallops, S1 normal, S2 normal and systolic ejection murmur heard ABDOMEN:abdomen soft, non-tender, obese and normal bowel sounds BACK: Back symmetric, no curvature. EXTREMITIES:less then 2 second capillary refill, no joint deformities,  effusion, or inflammation, no skin discoloration, no cyanosis  NEURO: alert & oriented x 3 with fluent speech, no focal motor/sensory deficits, gait normal   LABORATORY DATA: CBC    Component Value Date/Time   WBC 4.0 04/22/2016 1149   RBC 2.48 (L) 04/22/2016 1149   HGB 7.7 (L) 04/22/2016 1149   HCT 24.1 (L) 04/22/2016 1149    PLT 37 (L) 04/22/2016 1149   MCV 97.2 04/22/2016 1149   MCH 31.0 04/22/2016 1149   MCHC 32.0 04/22/2016 1149   RDW 19.3 (H) 04/22/2016 1149   LYMPHSABS 1.0 04/22/2016 1149   MONOABS 0.6 04/22/2016 1149   EOSABS 0.1 04/22/2016 1149   BASOSABS 0.0 04/22/2016 1149      Chemistry      Component Value Date/Time   NA 133 (L) 03/31/2016 1139   K 5.5 (H) 03/31/2016 1139   CL 105 03/31/2016 1139   CO2 22 03/31/2016 1139   BUN 57 (H) 03/31/2016 1139   CREATININE 3.36 (H) 03/31/2016 1139      Component Value Date/Time   CALCIUM 8.5 (L) 03/31/2016 1139   ALKPHOS 76 03/31/2016 1139   AST 21 03/31/2016 1139   ALT 17 03/31/2016 1139   BILITOT 0.2 (L) 03/31/2016 1139        PENDING LABS:   RADIOGRAPHIC STUDIES:  Ct Abdomen Pelvis Wo Contrast  Result Date: 04/07/2016 CLINICAL DATA:  Follow-up follicular lymphoma diagnosed in 2014. Right iliac bone marrow core biopsy performed 1 day prior. History of appendectomy and chronic kidney disease. EXAM: CT ABDOMEN AND PELVIS WITHOUT CONTRAST TECHNIQUE: Multidetector CT imaging of the abdomen and pelvis was performed following the standard protocol without IV contrast. COMPARISON:  05/08/2013 CT abdomen/ pelvis. FINDINGS: Lower chest: No significant pulmonary nodules or acute consolidative airspace disease. Coronary atherosclerosis. Hepatobiliary: Normal liver with no liver mass. Cholelithiasis, with no gallbladder wall thickening or pericholecystic fluid. No biliary ductal dilatation. Pancreas: Normal, with no mass or duct dilation. Spleen: Normal size. No mass. Adrenals/Urinary Tract: Normal adrenals. Severe right and moderate left renal atrophy, which has worsened in the interval on the right. No hydronephrosis. No renal stones. Subcentimeter simple renal cyst in the posterior interpolar left kidney. No additional contour deforming renal lesions. No bladder stones or bladder wall thickening. Stomach/Bowel: Grossly normal stomach. Normal caliber  small bowel with no small bowel wall thickening. Appendectomy. Mild sigmoid diverticulosis, with no large bowel wall thickening or pericolonic fat stranding. Oral contrast progresses to the distal colon. Vascular/Lymphatic: Atherosclerotic nonaneurysmal abdominal aorta. There is soft tissue density encasing the bilateral common iliac vessels and extending inferiorly in the pre-lumbosacral space from the aortic bifurcation, measuring up to the 14 mm thickness, previously 17 mm on 05/08/2013 using similar measurement technique, slightly decreased. Otherwise no pathologically enlarged abdominopelvic lymph nodes. Reproductive: Grossly normal uterus. Simple appearing 1.6 cm right adnexal cyst (series 2/ image 70), previously 1.3 cm, mildly increased. No left adnexal mass. Other: No pneumoperitoneum, ascites or focal fluid collection. Musculoskeletal: No aggressive appearing focal osseous lesions. Marked thoracolumbar spondylosis. IMPRESSION: 1. Soft tissue density in the pre-lumbosacral space encasing the bilateral common iliac vessels has decreased slightly since 05/08/2013 and is most consistent with treated lymphoma / post treatment change. 2. Otherwise no lymphadenopathy or other findings of active lymphoma in the abdomen or pelvis. 3. Interval progression of asymmetric severe right renal atrophy. Stable moderate left renal atrophy. No hydronephrosis. 4. Simple 1.6 cm right adnexal cyst, mildly increased since 2014, for which no further follow-up is required. This recommendation  follows ACR consensus guidelines: White Paper of the ACR Incidental Findings Committee II on Adnexal Findings. J Am Coll Radiol 2013:10:675-681. 5. Additional findings include aortic atherosclerosis, coronary atherosclerosis, cholelithiasis and mild sigmoid diverticulosis. Electronically Signed   By: Ilona Sorrel M.D.   On: 04/07/2016 10:45   Ct Biopsy  Result Date: 04/06/2016 CLINICAL DATA:  history of follicular non-Hodgkin's lymphoma  ,s/p chemotherapy. She also has anemia of chronic renal failure as well as thrombocytopenia. She presents today for CT-guided bone marrow biopsy for follow-up evaluation. She currently denies fever, headache, chest pain, cough, abdominal pain, nausea, vomiting or abnormal bleeding. She has some dyspnea with exertion, fatigue and occasional left hip discomfort EXAM: CT GUIDED DEEP ILIAC BONE ASPIRATION AND CORE BIOPSY TECHNIQUE: The procedure, risks (including but not limited to bleeding, infection, organ damage ), benefits, and alternatives were explained to the patient. Questions regarding the procedure were encouraged and answered. The patient understands and consents to the procedure. Patient was placed supine on the CT gantry and limited axial scans through the pelvis were obtained. Appropriate skin entry site was identified. Skin site was marked, prepped with Betadine, draped in usual sterile fashion, and infiltrated locally with 1% lidocaine. Intravenous Fentanyl and Versed were administered as conscious sedation during continuous monitoring of the patient's level of consciousness and physiological / cardiorespiratory status by the radiology RN, with a total moderate sedation time of 7 minutes. Under CT fluoroscopic guidance an 11-gauge Cook trocar bone needle was advanced into the right iliac bone just lateral to the sacroiliac joint. Once needle tip position was confirmed, coaxial core and aspiration samples were obtained. The final sample was obtained using the guiding needle itself, which was then removed. Post procedure scans show no hematoma or fracture. Patient tolerated procedure well. COMPLICATIONS: COMPLICATIONS none IMPRESSION: 1. Technically successful CT guided right iliac bone core and aspiration biopsy. Electronically Signed   By: Lucrezia Europe M.D.   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  Cytogenetics demonstrate a 5q- and 7q-.  Her case was discussed with Dr. Florene Glen at [facility] regarding this new diagnosis.  He agrees with our assessment and plan.  Given her 5q- disease, she is a candidate for Revlimid therapy.  She would require a reduced dose (5 mg daily) due to her renal function.  She is not a candidate for more aggressive therapy options.  Given her high percentage of myeloblastic cells, she is advised that the likelihood of benefit from Revlmid therapy is low.  We reviewed the risks, benefits, alternatives, and side effects of therapy including, but not limited to VTE, peripheral edema, fatigue, dizziness, insomnia, rash, anaphylaxis, nausea/vomiting, diarrhea, constipation, decreased blood counts, increased risk of infection, death.  She is advised that her blood counts will worsen before they improve and therefore blood and platelet transfusions will likely be required on a nearly every week or every 2 week schedule.  She will require weekly lab work for the first 8 weeks as well.  Following 6-8 weeks worth of treatment, she will need a repeat bone marrow aspiration and biopsy to evaluate response to therapy.  On the other hand, we discussed best supportive care and Hospice.  She is provided the pros and cons of Hospice services.  We had a  long conversation regarding Hospice vs Chemotherapy.  The patient is aware that chemotherapy may adversely affect quality of life and may not prove to be beneficial.  Patient education was given regarding Hospice and the services they provide.  The patient understands that studies report that early enrollment in Hospice actually allows the patient to live longer compared to those who enroll in Hospice nearer to end of life.  Hospice will allow the patient to stay at home at end of life  or go to a facility for end of life care.  At this point, the patient would like to remain at home.  Hospice provides the patient with a team of providers to help with care including physicians, nurses, aids, chaplains, and social workers.  Hospice's goal is to keep patients out of the hospital and and comfortable by controlling symptoms with medications.  The patient is certainly Hospice appropriate with a life expectancy of less than 6 months, more likely weeks to 2 months.  The patient understands that he can be discharged from Hospice at anytime.    After a long discussion, she seems to be favoring Hospice intervention.  She is concerned about what her children will think.    She will call us on Monday or Tuesday to update [place] with her decision.  Rx is printed for Lexapro 10 mg PO daily and Xanax 0.25 mg PO BID PRN.  She will return on a PRN basis based upon her decision moving forward.  45 minutes was spent in face to face contact with the patient and 60 minutes was spent on entire encounter.    More than 50% of the time spent with the patient was utilized for counseling and coordination of care.    ORDERS PLACED FOR THIS ENCOUNTER: Orders Placed This Encounter  Procedures  . CBC with Differential  . Comprehensive metabolic panel  . TSH  . CBC with Differential  . Comprehensive metabolic panel    MEDICATIONS PRESCRIBED THIS ENCOUNTER: Meds ordered this encounter  Medications  . Insulin Glargine 300 UNIT/ML SOPN    Sig: Inject into the skin.  Marland Kitchen amLODipine (NORVASC) 10 MG tablet    Sig: Take 10 mg by mouth daily.    Refill:  5  . escitalopram (LEXAPRO) 10 MG tablet    Sig: Take 1 tablet (10 mg total) by mouth daily.    Dispense:  30 tablet    Refill:  1    Order Specific Question:   Supervising Provider    Answer:   Patrici Ranks [id number]  . ALPRAZolam (XANAX) 0.25 MG tablet    Sig: Take 1 tablet (0.25 mg total) by mouth 2 (two) times daily as needed for anxiety.     Dispense:  30 tablet    Refill:  1    Order Specific Question:   Supervising Provider    Answer:   Patrici Ranks [id number]    THERAPY PLAN:  Discussion regarding Revlimid 5 mg daily with low likelihood of benefit versus Hospice.    All questions were answered. The patient knows to call the clinic with any problems, questions or concerns. We can certainly see the patient much sooner if necessary.  Patient and plan discussed with Dr. Ancil Linsey and she is in agreement with the aforementioned.   This note is electronically signed by: Doy Mince 04/22/2016 5:37 PM

## 2016-04-22 NOTE — Progress Notes (Signed)
Patient had MD appointment today and they discussed her care moving forward and patient not to have anything done today per Robynn Pane, PA.

## 2016-04-22 NOTE — Assessment & Plan Note (Addendum)
RAEB-2 with findings concerning for evolving acute myeloid leukemia, bone marrow aspiration and biopsy on 04/06/2016 demonstrating 18% or greater myeloblastic cells (with foci exceeding 20%).  Cytogenetics demonstrate a 5q- and 7q-.  Her case was discussed with Dr. Florene Glen at [facility] regarding this new diagnosis.  He agrees with our assessment and plan.  Given her 5q- disease, she is a candidate for Revlimid therapy.  She would require a reduced dose (5 mg daily) due to her renal function.  She is not a candidate for more aggressive therapy options.  Given her high percentage of myeloblastic cells, she is advised that the likelihood of benefit from Revlmid therapy is low.  We reviewed the risks, benefits, alternatives, and side effects of therapy including, but not limited to VTE, peripheral edema, fatigue, dizziness, insomnia, rash, anaphylaxis, nausea/vomiting, diarrhea, constipation, decreased blood counts, increased risk of infection, death.  She is advised that her blood counts will worsen before they improve and therefore blood and platelet transfusions will likely be required on a nearly every week or every 2 week schedule.  She will require weekly lab work for the first 8 weeks as well.  Following 6-8 weeks worth of treatment, she will need a repeat bone marrow aspiration and biopsy to evaluate response to therapy.  On the other hand, we discussed best supportive care and Hospice.  She is provided the pros and cons of Hospice services.  We had a long conversation regarding Hospice vs Chemotherapy.  The patient is aware that chemotherapy may adversely affect quality of life and may not prove to be beneficial.  Patient education was given regarding Hospice and the services they provide.  The patient understands that studies report that early enrollment in Hospice actually allows the patient to live longer compared to those who enroll in Hospice nearer to end of life.  Hospice will allow the patient to stay  at home at end of life or go to a facility for end of life care.  At this point, the patient would like to remain at home.  Hospice provides the patient with a team of providers to help with care including physicians, nurses, aids, chaplains, and social workers.  Hospice's goal is to keep patients out of the hospital and and comfortable by controlling symptoms with medications.  The patient is certainly Hospice appropriate with a life expectancy of less than 6 months, more likely weeks to 2 months.  The patient understands that he can be discharged from Hospice at anytime.    After a long discussion, she seems to be favoring Hospice intervention.  She is concerned about what her children will think.    She will call us on Monday or Tuesday to update [place] with her decision.  Rx is printed for Lexapro 10 mg PO daily and Xanax 0.25 mg PO BID PRN.  She will return on a PRN basis based upon her decision moving forward.  45 minutes was spent in face to face contact with the patient and 60 minutes was spent on entire encounter.    More than 50% of the time spent with the patient was utilized for counseling and coordination of care.

## 2016-04-23 ENCOUNTER — Other Ambulatory Visit (HOSPITAL_COMMUNITY): Payer: Medicare Other

## 2016-04-23 ENCOUNTER — Ambulatory Visit (HOSPITAL_COMMUNITY): Payer: Medicare Other

## 2016-04-23 ENCOUNTER — Encounter (HOSPITAL_COMMUNITY): Payer: Medicare Other

## 2016-04-26 ENCOUNTER — Telehealth (HOSPITAL_COMMUNITY): Payer: Self-pay | Admitting: *Deleted

## 2016-04-26 ENCOUNTER — Telehealth (HOSPITAL_COMMUNITY): Payer: Self-pay | Admitting: Emergency Medicine

## 2016-04-26 NOTE — Telephone Encounter (Signed)
I will ask jennifer to call patient since I have a meeting in [place] to be at.    Karalynn Cottone, PA-C 04/26/2016 3:15 PM

## 2016-04-26 NOTE — Telephone Encounter (Signed)
Called pt to talk with her about her recent visit with the doctor.  She understands that there is not much more than can be done for her condition but she is not ready for hospice yet.  They want to wait a few weeks to see how the pt does.  I gave them my number and information and told them to call me with updates and when they were ready.  They verbalized understanding.

## 2016-04-29 ENCOUNTER — Encounter (HOSPITAL_COMMUNITY): Payer: Self-pay

## 2016-05-06 ENCOUNTER — Encounter (HOSPITAL_COMMUNITY): Payer: Self-pay | Admitting: Lab

## 2016-05-06 ENCOUNTER — Other Ambulatory Visit (HOSPITAL_COMMUNITY): Payer: Self-pay | Admitting: Oncology

## 2016-05-06 DIAGNOSIS — D4622 Refractory anemia with excess of blasts 2: Secondary | ICD-10-CM

## 2016-05-06 MED ORDER — LORAZEPAM 2 MG/ML PO CONC: 0.5000 mg | ORAL | 0 refills | Status: AC | PRN

## 2016-05-06 MED ORDER — MORPHINE SULFATE (CONCENTRATE) 20 MG/ML PO SOLN: 5.0000 mg | ORAL | 0 refills | Status: AC | PRN

## 2016-05-06 NOTE — Progress Notes (Unsigned)
Referral to Hospice.  Records faxed on 10/26

## 2016-05-12 DEATH — deceased

## 2016-06-30 ENCOUNTER — Ambulatory Visit (HOSPITAL_COMMUNITY): Payer: Medicare Other | Admitting: Hematology & Oncology

## 2017-09-23 IMAGING — US ULTRASOUND LEFT BREAST LIMITED
1 series · 1 of 1 positions shown · non-contrast
Comparison: Previous exam(s).

CLINICAL DATA: Left yellow nipple discharge.  History of lymphoma.

EXAM:
2D DIGITAL DIAGNOSTIC BILATERAL MAMMOGRAM WITH CAD AND ADJUNCT TOMO
ULTRASOUND LEFT BREAST

[Series 1: ultrasound left breast limited · 0.07mm/px · 1 of 1 slices shown]
[im 1/1]
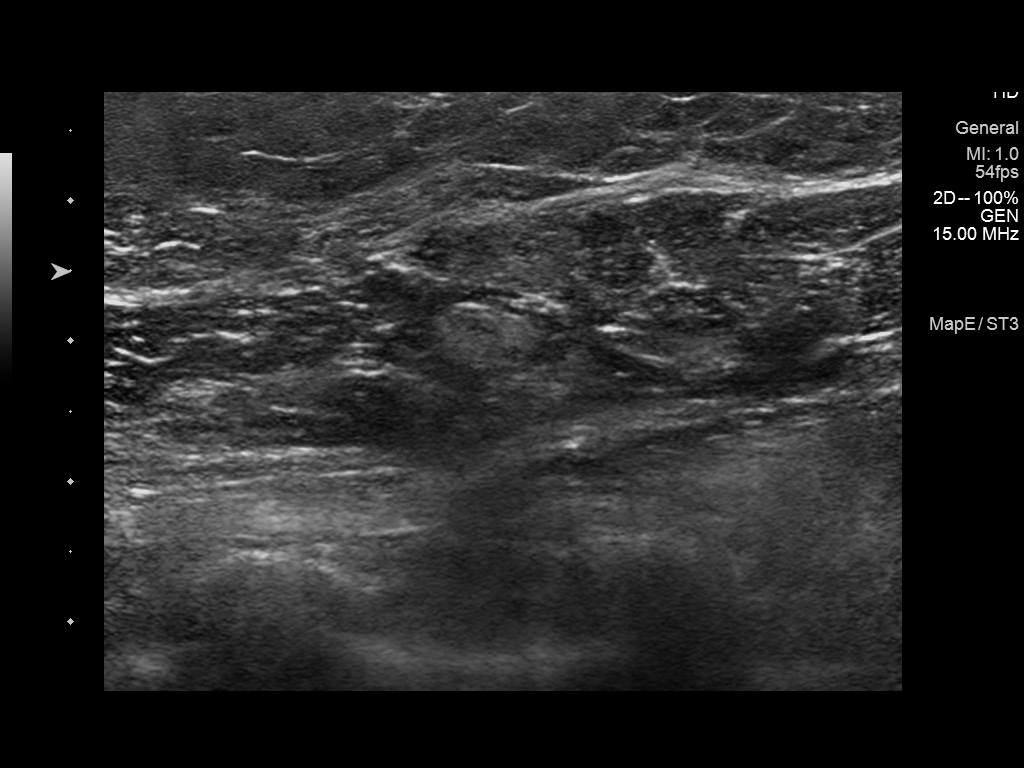

[1 of 1 positions shown; findings below may reference images not displayed]

ACR Breast Density Category c: The breast tissue is heterogeneously
dense, which may obscure small masses.
FINDINGS: Mammographically, there are no suspicious masses or
microcalcifications in either breast. However, there is a prominent
skin thickening within the anterior subareolar left breast seen on
both orthogonal mammographic views. A 5 mm asymmetry is also seen in
the slightly inferior left breast, posterior depth, seen on the MLO
view.

Mammographic images were processed with CAD.

On physical exam, no suspicious masses are found. No skin changes
are seen.

Targeted ultrasound is performed, showing no suspicious masses or
shadowing lesions.
IMPRESSION: Left slightly inferior breast asymmetry without sonographic
correlation. Although this asymmetry is seen only on one view, its
appearance as masslike and therefore stereotactic core needle biopsy
is recommended.

Anterior left breast skin thickening noted mammographically.

RECOMMENDATION:
Stereotactic core needle biopsy of the left breast.

Surgical consult, and possibly MRI of the breast if found clinically
indicated, are recommended given patient's complains of spontaneous
left nipple discharge and mammographically seen anterior left skin
thickening.

I have discussed the findings and recommendations with the patient.
Results were also provided in writing at the conclusion of the
visit. If applicable, a reminder letter will be sent to the patient
regarding the next appointment.

BI-RADS CATEGORY  4: Suspicious.
